# Patient Record
Sex: Female | Born: 1950 | Race: Black or African American | Hispanic: No | Smoking: Never smoker
Health system: Southern US, Community
[De-identification: ages and names within clinical notes are randomized; demographics above are authoritative.]

## PROBLEM LIST (undated history)

## (undated) DIAGNOSIS — K219 Gastro-esophageal reflux disease without esophagitis: Secondary | ICD-10-CM

## (undated) DIAGNOSIS — I10 Essential (primary) hypertension: Secondary | ICD-10-CM

## (undated) DIAGNOSIS — F419 Anxiety disorder, unspecified: Secondary | ICD-10-CM

## (undated) DIAGNOSIS — D649 Anemia, unspecified: Secondary | ICD-10-CM

## (undated) DIAGNOSIS — E119 Type 2 diabetes mellitus without complications: Secondary | ICD-10-CM

## (undated) DIAGNOSIS — K5730 Diverticulosis of large intestine without perforation or abscess without bleeding: Secondary | ICD-10-CM

## (undated) DIAGNOSIS — E7800 Pure hypercholesterolemia, unspecified: Secondary | ICD-10-CM

## (undated) DIAGNOSIS — R1032 Left lower quadrant pain: Secondary | ICD-10-CM

## (undated) HISTORY — DX: Gastro-esophageal reflux disease without esophagitis: K21.9

## (undated) HISTORY — DX: Left lower quadrant pain: R10.32

## (undated) HISTORY — DX: Anxiety disorder, unspecified: F41.9

## (undated) HISTORY — DX: Anemia, unspecified: D64.9

## (undated) HISTORY — DX: Diverticulosis of large intestine without perforation or abscess without bleeding: K57.30

## (undated) HISTORY — PX: PARTIAL HYSTERECTOMY: SHX80

---

## 1998-05-26 ENCOUNTER — Emergency Department (HOSPITAL_COMMUNITY): Admission: EM | Admit: 1998-05-26 | Discharge: 1998-05-26 | Payer: Self-pay | Admitting: Family Medicine

## 1998-05-28 ENCOUNTER — Encounter: Admission: RE | Admit: 1998-05-28 | Discharge: 1998-05-28 | Payer: Self-pay | Admitting: Family Medicine

## 1998-05-30 ENCOUNTER — Encounter: Admission: RE | Admit: 1998-05-30 | Discharge: 1998-08-28 | Payer: Self-pay | Admitting: *Deleted

## 1998-07-10 ENCOUNTER — Encounter: Admission: RE | Admit: 1998-07-10 | Discharge: 1998-07-10 | Payer: Self-pay | Admitting: Family Medicine

## 1998-10-10 ENCOUNTER — Encounter: Admission: RE | Admit: 1998-10-10 | Discharge: 1998-10-10 | Payer: Self-pay | Admitting: Family Medicine

## 1998-10-15 ENCOUNTER — Encounter: Admission: RE | Admit: 1998-10-15 | Discharge: 1998-10-15 | Payer: Self-pay | Admitting: Family Medicine

## 1998-11-09 ENCOUNTER — Encounter: Admission: RE | Admit: 1998-11-09 | Discharge: 1998-11-09 | Payer: Self-pay | Admitting: Family Medicine

## 1998-11-21 ENCOUNTER — Encounter: Admission: RE | Admit: 1998-11-21 | Discharge: 1998-11-21 | Payer: Self-pay | Admitting: Family Medicine

## 1998-11-26 ENCOUNTER — Encounter: Admission: RE | Admit: 1998-11-26 | Discharge: 1998-11-26 | Payer: Self-pay | Admitting: Sports Medicine

## 1998-11-29 ENCOUNTER — Encounter: Admission: RE | Admit: 1998-11-29 | Discharge: 1998-11-29 | Payer: Self-pay | Admitting: Sports Medicine

## 1998-12-05 ENCOUNTER — Encounter: Admission: RE | Admit: 1998-12-05 | Discharge: 1998-12-05 | Payer: Self-pay | Admitting: Family Medicine

## 1998-12-12 ENCOUNTER — Encounter: Admission: RE | Admit: 1998-12-12 | Discharge: 1998-12-12 | Payer: Self-pay | Admitting: Sports Medicine

## 1999-05-27 ENCOUNTER — Encounter: Admission: RE | Admit: 1999-05-27 | Discharge: 1999-05-27 | Payer: Self-pay | Admitting: Family Medicine

## 1999-05-30 ENCOUNTER — Encounter: Admission: RE | Admit: 1999-05-30 | Discharge: 1999-05-30 | Payer: Self-pay | Admitting: Family Medicine

## 1999-06-03 ENCOUNTER — Encounter: Admission: RE | Admit: 1999-06-03 | Discharge: 1999-06-03 | Payer: Self-pay | Admitting: Family Medicine

## 2000-04-20 ENCOUNTER — Emergency Department (HOSPITAL_COMMUNITY): Admission: EM | Admit: 2000-04-20 | Discharge: 2000-04-20 | Payer: Self-pay | Admitting: Emergency Medicine

## 2000-06-04 ENCOUNTER — Encounter: Admission: RE | Admit: 2000-06-04 | Discharge: 2000-06-04 | Payer: Self-pay | Admitting: Family Medicine

## 2001-01-27 ENCOUNTER — Encounter: Admission: RE | Admit: 2001-01-27 | Discharge: 2001-01-27 | Payer: Self-pay | Admitting: Sports Medicine

## 2001-01-27 ENCOUNTER — Encounter: Payer: Self-pay | Admitting: Sports Medicine

## 2001-10-21 ENCOUNTER — Encounter: Admission: RE | Admit: 2001-10-21 | Discharge: 2001-10-21 | Payer: Self-pay | Admitting: Family Medicine

## 2002-08-08 ENCOUNTER — Emergency Department (HOSPITAL_COMMUNITY): Admission: EM | Admit: 2002-08-08 | Discharge: 2002-08-08 | Payer: Self-pay | Admitting: Emergency Medicine

## 2002-08-10 ENCOUNTER — Encounter: Admission: RE | Admit: 2002-08-10 | Discharge: 2002-08-10 | Payer: Self-pay | Admitting: Neurology

## 2002-08-17 ENCOUNTER — Encounter: Admission: RE | Admit: 2002-08-17 | Discharge: 2002-08-17 | Payer: Self-pay | Admitting: Family Medicine

## 2002-08-24 ENCOUNTER — Encounter: Admission: RE | Admit: 2002-08-24 | Discharge: 2002-08-24 | Payer: Self-pay | Admitting: Family Medicine

## 2002-08-30 ENCOUNTER — Encounter: Payer: Self-pay | Admitting: Sports Medicine

## 2002-08-30 ENCOUNTER — Encounter: Admission: RE | Admit: 2002-08-30 | Discharge: 2002-08-30 | Payer: Self-pay | Admitting: Sports Medicine

## 2002-12-23 ENCOUNTER — Encounter: Admission: RE | Admit: 2002-12-23 | Discharge: 2002-12-23 | Payer: Self-pay | Admitting: Family Medicine

## 2003-03-29 ENCOUNTER — Ambulatory Visit (HOSPITAL_COMMUNITY): Admission: RE | Admit: 2003-03-29 | Discharge: 2003-03-29 | Payer: Self-pay | Admitting: Gastroenterology

## 2003-10-16 ENCOUNTER — Encounter: Admission: RE | Admit: 2003-10-16 | Discharge: 2003-10-16 | Payer: Self-pay | Admitting: Family Medicine

## 2003-10-23 ENCOUNTER — Encounter: Admission: RE | Admit: 2003-10-23 | Discharge: 2003-10-23 | Payer: Self-pay | Admitting: Family Medicine

## 2003-10-31 ENCOUNTER — Encounter: Admission: RE | Admit: 2003-10-31 | Discharge: 2003-10-31 | Payer: Self-pay | Admitting: Sports Medicine

## 2004-08-04 ENCOUNTER — Emergency Department (HOSPITAL_COMMUNITY): Admission: EM | Admit: 2004-08-04 | Discharge: 2004-08-04 | Payer: Self-pay | Admitting: Emergency Medicine

## 2005-10-01 ENCOUNTER — Ambulatory Visit: Payer: Self-pay | Admitting: Family Medicine

## 2005-10-23 ENCOUNTER — Ambulatory Visit: Payer: Self-pay | Admitting: Family Medicine

## 2005-12-16 ENCOUNTER — Ambulatory Visit: Payer: Self-pay | Admitting: Family Medicine

## 2005-12-17 ENCOUNTER — Encounter: Admission: RE | Admit: 2005-12-17 | Discharge: 2005-12-17 | Payer: Self-pay | Admitting: Sports Medicine

## 2006-03-03 ENCOUNTER — Ambulatory Visit: Payer: Self-pay | Admitting: Family Medicine

## 2006-03-16 ENCOUNTER — Ambulatory Visit: Payer: Self-pay | Admitting: Family Medicine

## 2006-03-16 ENCOUNTER — Ambulatory Visit (HOSPITAL_COMMUNITY): Admission: RE | Admit: 2006-03-16 | Discharge: 2006-03-16 | Payer: Self-pay | Admitting: Family Medicine

## 2006-03-23 ENCOUNTER — Ambulatory Visit: Payer: Self-pay | Admitting: Sports Medicine

## 2006-03-26 ENCOUNTER — Encounter (INDEPENDENT_AMBULATORY_CARE_PROVIDER_SITE_OTHER): Payer: Self-pay | Admitting: *Deleted

## 2006-03-26 ENCOUNTER — Ambulatory Visit (HOSPITAL_COMMUNITY): Admission: RE | Admit: 2006-03-26 | Discharge: 2006-03-26 | Payer: Self-pay | Admitting: Family Medicine

## 2006-04-30 ENCOUNTER — Ambulatory Visit: Payer: Self-pay | Admitting: Sports Medicine

## 2006-07-14 ENCOUNTER — Ambulatory Visit: Payer: Self-pay | Admitting: Family Medicine

## 2006-12-30 ENCOUNTER — Encounter: Admission: RE | Admit: 2006-12-30 | Discharge: 2006-12-30 | Payer: Self-pay | Admitting: Sports Medicine

## 2007-02-03 ENCOUNTER — Ambulatory Visit: Payer: Self-pay | Admitting: Family Medicine

## 2007-02-03 ENCOUNTER — Encounter (INDEPENDENT_AMBULATORY_CARE_PROVIDER_SITE_OTHER): Payer: Self-pay | Admitting: Family Medicine

## 2007-02-04 DIAGNOSIS — M899 Disorder of bone, unspecified: Secondary | ICD-10-CM | POA: Insufficient documentation

## 2007-02-04 DIAGNOSIS — I10 Essential (primary) hypertension: Secondary | ICD-10-CM | POA: Insufficient documentation

## 2007-02-04 DIAGNOSIS — M949 Disorder of cartilage, unspecified: Secondary | ICD-10-CM

## 2007-02-04 DIAGNOSIS — E785 Hyperlipidemia, unspecified: Secondary | ICD-10-CM | POA: Insufficient documentation

## 2007-03-19 ENCOUNTER — Encounter: Payer: Self-pay | Admitting: *Deleted

## 2007-03-22 ENCOUNTER — Ambulatory Visit: Payer: Self-pay | Admitting: Family Medicine

## 2007-03-22 ENCOUNTER — Encounter (INDEPENDENT_AMBULATORY_CARE_PROVIDER_SITE_OTHER): Payer: Self-pay | Admitting: Family Medicine

## 2007-03-22 LAB — CONVERTED CEMR LAB
ALT: 12 units/L (ref 0–35)
Albumin: 4.7 g/dL (ref 3.5–5.2)
BUN: 11 mg/dL (ref 6–23)
CO2: 27 meq/L (ref 19–32)
Cholesterol, target level: 200 mg/dL
Creatinine, Ser: 0.83 mg/dL (ref 0.40–1.20)
Glucose, Bld: 119 mg/dL — ABNORMAL HIGH (ref 70–99)
HDL: 43 mg/dL (ref >39)
Potassium: 4.5 meq/L (ref 3.5–5.3)
Sodium: 141 meq/L (ref 135–145)

## 2007-05-08 ENCOUNTER — Emergency Department (HOSPITAL_COMMUNITY): Admission: EM | Admit: 2007-05-08 | Discharge: 2007-05-08 | Payer: Self-pay | Admitting: Emergency Medicine

## 2007-05-11 ENCOUNTER — Telehealth: Payer: Self-pay | Admitting: *Deleted

## 2007-05-12 ENCOUNTER — Ambulatory Visit: Payer: Self-pay | Admitting: Family Medicine

## 2007-05-17 ENCOUNTER — Telehealth (INDEPENDENT_AMBULATORY_CARE_PROVIDER_SITE_OTHER): Payer: Self-pay | Admitting: *Deleted

## 2008-01-20 ENCOUNTER — Ambulatory Visit: Payer: Self-pay | Admitting: Family Medicine

## 2008-01-21 ENCOUNTER — Encounter: Payer: Self-pay | Admitting: Family Medicine

## 2008-01-21 ENCOUNTER — Ambulatory Visit: Payer: Self-pay | Admitting: Family Medicine

## 2008-01-21 LAB — CONVERTED CEMR LAB
BUN: 8 mg/dL (ref 6–23)
Calcium: 9.4 mg/dL (ref 8.4–10.5)
Chloride: 103 meq/L (ref 96–112)
Creatinine, Ser: 0.77 mg/dL (ref 0.40–1.20)
Sodium: 140 meq/L (ref 135–145)

## 2008-01-25 ENCOUNTER — Encounter: Admission: RE | Admit: 2008-01-25 | Discharge: 2008-01-25 | Payer: Self-pay | Admitting: *Deleted

## 2008-02-05 ENCOUNTER — Encounter (INDEPENDENT_AMBULATORY_CARE_PROVIDER_SITE_OTHER): Payer: Self-pay | Admitting: *Deleted

## 2008-03-28 ENCOUNTER — Telehealth: Payer: Self-pay | Admitting: *Deleted

## 2008-07-24 ENCOUNTER — Ambulatory Visit: Payer: Self-pay | Admitting: Family Medicine

## 2009-02-13 ENCOUNTER — Encounter: Admission: RE | Admit: 2009-02-13 | Discharge: 2009-02-13 | Payer: Self-pay | Admitting: *Deleted

## 2009-09-19 ENCOUNTER — Ambulatory Visit: Payer: Self-pay | Admitting: Family Medicine

## 2009-09-21 ENCOUNTER — Ambulatory Visit: Payer: Self-pay | Admitting: Family Medicine

## 2009-09-21 ENCOUNTER — Encounter: Payer: Self-pay | Admitting: Family Medicine

## 2009-09-21 LAB — CONVERTED CEMR LAB
ALT: 12 units/L (ref 0–35)
AST: 15 units/L (ref 0–37)
Alkaline Phosphatase: 106 units/L (ref 39–117)
BUN: 14 mg/dL (ref 6–23)
HDL: 44 mg/dL (ref >39)
Total Bilirubin: 0.5 mg/dL (ref 0.3–1.2)
Total Protein: 7.7 g/dL (ref 6.0–8.3)
VLDL: 31 mg/dL (ref 0–40)

## 2009-09-28 ENCOUNTER — Encounter: Payer: Self-pay | Admitting: Family Medicine

## 2009-11-06 ENCOUNTER — Ambulatory Visit: Payer: Self-pay | Admitting: Family Medicine

## 2009-11-06 DIAGNOSIS — E119 Type 2 diabetes mellitus without complications: Secondary | ICD-10-CM | POA: Insufficient documentation

## 2009-11-06 LAB — CONVERTED CEMR LAB: Hgb A1c MFr Bld: 7.4 %

## 2009-11-08 ENCOUNTER — Telehealth: Payer: Self-pay | Admitting: Family Medicine

## 2009-11-26 ENCOUNTER — Ambulatory Visit: Payer: Self-pay | Admitting: Family Medicine

## 2009-11-26 ENCOUNTER — Telehealth: Payer: Self-pay | Admitting: Family Medicine

## 2010-01-23 ENCOUNTER — Ambulatory Visit: Payer: Self-pay | Admitting: Family Medicine

## 2010-01-23 ENCOUNTER — Encounter: Payer: Self-pay | Admitting: Family Medicine

## 2010-01-23 DIAGNOSIS — R5383 Other fatigue: Secondary | ICD-10-CM

## 2010-01-23 DIAGNOSIS — R5381 Other malaise: Secondary | ICD-10-CM | POA: Insufficient documentation

## 2010-01-24 LAB — CONVERTED CEMR LAB
HCT: 36.3 % (ref 36.0–46.0)
Hemoglobin: 11.6 g/dL — ABNORMAL LOW (ref 12.0–15.0)
MCV: 99.2 fL (ref 78.0–100.0)
Platelets: 288 10*3/uL (ref 150–400)
RDW: 13.1 % (ref 11.5–15.5)
TSH: 1.418 microintl units/mL (ref 0.350–4.500)
WBC: 6.9 10*3/uL (ref 4.0–10.5)

## 2010-02-14 ENCOUNTER — Encounter: Admission: RE | Admit: 2010-02-14 | Discharge: 2010-02-14 | Payer: Self-pay | Admitting: Family Medicine

## 2010-05-02 ENCOUNTER — Telehealth: Payer: Self-pay | Admitting: Family Medicine

## 2010-05-24 ENCOUNTER — Ambulatory Visit: Payer: Self-pay | Admitting: Family Medicine

## 2010-05-24 ENCOUNTER — Encounter: Payer: Self-pay | Admitting: Family Medicine

## 2010-05-24 DIAGNOSIS — R634 Abnormal weight loss: Secondary | ICD-10-CM | POA: Insufficient documentation

## 2010-05-24 LAB — CONVERTED CEMR LAB
ALT: 10 units/L (ref 0–35)
AST: 13 units/L (ref 0–37)
Chloride: 104 meq/L (ref 96–112)
Creatinine, Ser: 0.89 mg/dL (ref 0.40–1.20)
Direct LDL: 63 mg/dL
Hgb A1c MFr Bld: 6.1 %
MCV: 98.6 fL (ref 78.0–100.0)
Platelets: 225 10*3/uL (ref 150–400)
RBC: 3.54 M/uL — ABNORMAL LOW (ref 3.87–5.11)
RDW: 13.1 % (ref 11.5–15.5)
Sodium: 141 meq/L (ref 135–145)
Total Bilirubin: 0.4 mg/dL (ref 0.3–1.2)
WBC: 5.2 10*3/uL (ref 4.0–10.5)

## 2010-05-27 ENCOUNTER — Encounter: Payer: Self-pay | Admitting: Family Medicine

## 2010-06-06 ENCOUNTER — Telehealth: Payer: Self-pay | Admitting: *Deleted

## 2010-12-19 ENCOUNTER — Telehealth: Payer: Self-pay | Admitting: *Deleted

## 2010-12-27 ENCOUNTER — Ambulatory Visit: Admission: RE | Admit: 2010-12-27 | Discharge: 2010-12-27 | Payer: Self-pay | Source: Home / Self Care

## 2010-12-27 ENCOUNTER — Encounter: Payer: Self-pay | Admitting: Family Medicine

## 2010-12-27 DIAGNOSIS — K5900 Constipation, unspecified: Secondary | ICD-10-CM | POA: Insufficient documentation

## 2010-12-27 LAB — CONVERTED CEMR LAB
ALT: 8 units/L (ref 0–35)
CO2: 28 meq/L (ref 19–32)
Calcium: 9.8 mg/dL (ref 8.4–10.5)
Creatinine, Ser: 0.78 mg/dL (ref 0.40–1.20)
Glucose, Bld: 108 mg/dL — ABNORMAL HIGH (ref 70–99)
HDL: 44 mg/dL (ref >39)
Sodium: 140 meq/L (ref 135–145)

## 2011-01-01 ENCOUNTER — Encounter: Payer: Self-pay | Admitting: Family Medicine

## 2011-01-09 NOTE — Progress Notes (Signed)
Summary: Rx  Phone Note Refill Request Call back at Home Phone [phone]   pt req rx for her testing strips, pt uses a prodigy meter. pt goes to [facility]   Initial call taken by: Knox Royalty,  December 19, 2010 8:38 AM    New/Updated Medications: PRODIGY PREFERRED MONITOR W/DEVICE KIT (BLOOD GLUCOSE MONITORING SUPPL) check blood sugars twice daily dx: DM type II 250.00 Prescriptions: PRODIGY PREFERRED MONITOR W/DEVICE KIT (BLOOD GLUCOSE MONITORING SUPPL) check blood sugars twice daily dx: DM type II 250.00  #1 x 0   Entered by:   Arlyss Repress CMA,   Authorized by:   Ellin Mayhew MD   Signed by:   Arlyss Repress CMA, on 12/19/2010   Method used:   Electronically to        [facility] [phone]* (retail)       [address]       [place], [place]  [address]       Ph: [phone]       Fax: [phone]   RxID:   [id number] PRODIGY PREFERRED MONITOR W/DEVICE KIT (BLOOD GLUCOSE MONITORING SUPPL) check blood sugars twice daily dx: DM type II 250.00  #0 x 0   Entered by:   Arlyss Repress CMA,   Authorized by:   Ellin Mayhew MD   Signed by:   Arlyss Repress CMA, on 12/19/2010   Method used:   Electronically to        [facility] [phone]* (retail)       [address]       [place], [place]  [address]       Ph: [phone]       Fax: [phone]   RxID:   [id number]

## 2011-01-09 NOTE — Assessment & Plan Note (Signed)
Summary: diabetes/htn/hyperlipidemia   Vital Signs:  Patient profile:   60 year old female Height:      63 inches Weight:      123 pounds BMI:     21.87 Temp:     98.0 degrees F oral Pulse rate:   73 / minute BP sitting:   128 / 79  (left arm) Cuff size:   regular  Vitals Entered By: Tessie Fass CMA (December 27, 2010 9:40 AM)   Primary Care Provider:  Ardeen Garland  MD   History of Present Illness:  Diabetes: Taking metformin in am.  tests 3 x week. in Am's usually 130's, the highest 170, the lowest 83. no symptoms of hypoglycemia.  pt states she is open to diabetes education classes.   h/a: usually doesn't take anything b/c they are not very strong.  has 1-2 x per week at night when trying to sleep.  wants to know if safe to take tylenol.  weight loss: pt states she is at goal weight, has cut back on suger, has a pepsi occasional,  has lost 1 lb in past 6 months.  States she eats lots of fruits and vegtables.   occasional constipation: pt wants recommendation of what she can take for her constipation.  Has a long hx of constipation off and on.  ros:  no fever, occasional constipation, no diarrhea, no n/v, no weakness, no blurry vision. occasional dizziness   Current Medications (verified): 1)  Aspirin Ec 81 Mg Tbec (Aspirin) .... Take 1 Tablet By Mouth Once A Day 2)  Pravachol 20 Mg Tabs (Pravastatin Sodium) .Marland Kitchen.. 1 Tab By Mouth Qhs Every Night For High Cholesterol 3)  Lisinopril-Hydrochlorothiazide 20-25 Mg  Tabs (Lisinopril-Hydrochlorothiazide) .... Take 1 Tab By Mouth Daily 4)  Metformin Hcl 500 Mg Tabs (Metformin Hcl) .Marland Kitchen.. 1 Tab By Mouth Qhs Every Night For Diabetes 5)  Prodigy Preferred Monitor W/device Kit (Blood Glucose Monitoring Suppl) .... Check Blood Sugars Twice Daily Dx: Dm Type Ii 250.00  Allergies (verified): No Known Drug Allergies  Review of Systems       as per hpi  Physical Exam  General:  Well-developed,well-nourished,in no acute distress;  alert,appropriate and cooperative throughout examination Lungs:  Normal respiratory effort, chest expands symmetrically. Lungs are clear to auscultation, no crackles or wheezes. Heart:  Normal rate and regular rhythm. S1 and S2 normal without gallop, murmur, click, rub or other extra sounds. Abdomen:  soft and non-tender.   Extremities:  no edema Skin:  Intact without suspicious lesions or rashes Psych:  Cognition and judgment appear intact. Alert and cooperative with normal attention span and concentration. No apparent delusions, illusions, hallucinations  Diabetes Management Exam:    Foot Exam (with socks and/or shoes not present):       Sensory-Pinprick/Light touch:          Left medial foot (L-4): normal          Left dorsal foot (L-5): normal          Left lateral foot (S-1): normal          Right medial foot (L-4): normal          Right dorsal foot (L-5): normal          Right lateral foot (S-1): normal       Sensory-Monofilament:          Left foot: normal          Right foot: normal       Inspection:  Left foot: normal          Right foot: normal       Nails:          Left foot: normal          Right foot: normal   Impression & Recommendations:  Problem # 1:  DIABETES MELLITUS, TYPE II, WITHOUT COMPLICATIONS (ICD-250.00) will refer to diabetes education classes.  A1C pending.  Will recheck renal function and urine microalbumin.  Pt instructed to obtain eye and dental appt.  Pt states that she will try to schedule.  No red flags on physical exam.  Foot exam WNL.  Her updated medication list for this problem includes:    Aspirin Ec 81 Mg Tbec (Aspirin) .Marland Kitchen... Take 1 tablet by mouth once a day    Lisinopril-hydrochlorothiazide 20-25 Mg Tabs (Lisinopril-hydrochlorothiazide) .Marland Kitchen... Take 1 tab by mouth daily    Metformin Hcl 500 Mg Tabs (Metformin hcl) .Marland Kitchen... 1 tab by mouth qhs every night for diabetes  Orders: A1C-[facility] (78469) UA Microalbumin-[facility] (62952) [facility]- Est   Level 4 (84132)  Problem # 2:  HYPERTENSION, BENIGN SYSTEMIC (ICD-401.1) bp wnl today.  Will continue to follow bp- will continue at this dose.   Her updated medication list for this problem includes:    Lisinopril-hydrochlorothiazide 20-25 Mg Tabs (Lisinopril-hydrochlorothiazide) .Marland Kitchen... Take 1 tab by mouth daily  Orders: Comp Met-[facility] ([address]-27253) [facility]- Est  Level 4 (66440)  Problem # 3:  HYPERLIPIDEMIA (ICD-272.4) will check lipid panel today.  Will also check liver panel. continue as prescribed unless any red flags on lab results.   Her updated medication list for this problem includes:    Pravachol 20 Mg Tabs (Pravastatin sodium) .Marland Kitchen... 1 tab by mouth qhs every night for high cholesterol  Orders: Comp Met-[facility] (34742-59563) Lipid-[facility] (87564-33295) [facility]- Est  Level 4 (18841)  Problem # 4:  WEIGHT LOSS (ICD-783.21)  pt at goal weight of 124.  States she has not been "trying" to lose weight.  BMI 21.  Encouraged pt to mantain weight at current weight.  Encouraged healthy eating and exercise.  Orders: [facility]- Est  Level 4 (99214)  Problem # 5:  CONSTIPATION (ICD-564.00)  suggested fibercon supplement to maintain stool regularity. pt to try and let me know if no improvement.  Orders: [facility]- Est  Level 4 (66063)  Problem # 6:  Prevention: pt is uptodate on mammogram and colonoscopy.  No longer needs pap's 2/2 hysterectomy.  [facility] form reviewed.  Pt is to return in 6 months for f/up and complete physical.   Complete Medication List: 1)  Aspirin Ec 81 Mg Tbec (Aspirin) .... Take 1 tablet by mouth once a day 2)  Pravachol 20 Mg Tabs (Pravastatin sodium) .Marland Kitchen.. 1 tab by mouth qhs every night for high cholesterol 3)  Lisinopril-hydrochlorothiazide 20-25 Mg Tabs (Lisinopril-hydrochlorothiazide) .... Take 1 tab by mouth daily 4)  Metformin Hcl 500 Mg Tabs (Metformin hcl) .Marland Kitchen.. 1 tab by mouth qhs every night for diabetes 5)  Prodigy Preferred Monitor W/device Kit (Blood glucose monitoring suppl)  .... Check blood sugars twice daily dx: dm type ii 250.00  Other Orders: Misc. Referral (Misc. Ref)  Patient Instructions: 1)  you need to get an eye appt and a dental appt. 2)  I will send you a letter with your results. 3)  keep up the good work. 4)  If you have any questions or concerns please give me a call otherwise, please come back in 6 months.  5)  very nice  meeting you today! Prescriptions: METFORMIN HCL 500 MG TABS (METFORMIN HCL) 1 tab by mouth qHS every night for diabetes  #30 Tablet x 6   Entered and Authorized by:   Ellin Mayhew MD   Signed by:   Ellin Mayhew MD on 12/27/2010   Method used:   Electronically to        [facility] [address] [phone]* (retail)       [address]       [place], [place]  [address]       Ph: [phone]       Fax: [phone]   RxID:   [id number] LISINOPRIL-HYDROCHLOROTHIAZIDE 20-25 MG  TABS (LISINOPRIL-HYDROCHLOROTHIAZIDE) Take 1 tab by mouth daily  #30 Tablet x 6   Entered and Authorized by:   Ellin Mayhew MD   Signed by:   Ellin Mayhew MD on 12/27/2010   Method used:   Electronically to        [facility] [phone]* (retail)       [address]       [place], [place]  [address]       Ph: [phone]       Fax: [phone]   RxID:   [id number] PRAVACHOL 20 MG TABS (PRAVASTATIN SODIUM) 1 tab by mouth qHS every night for high cholesterol  #30 Tablet x 6   Entered and Authorized by:   Ellin Mayhew MD   Signed by:   Ellin Mayhew MD on 12/27/2010   Method used:   Electronically to        [facility] [address] [phone]* (retail)       [address]       [place], [place]  [address]       Ph: [phone]       Fax: [phone]   RxID:   [id number]    Orders Added: 1)  Comp Met-[facility] [94854-62703] 2)  Lipid-[facility] [80061-22930] 3)  A1C-[facility] [83036] 4)  UA Microalbumin-[facility] [82044] 5)  Misc. Referral [Misc. Ref] 6)  [facility]- Est  Level 4 [50093]     Prevention & Chronic Care Immunizations   Influenza vaccine: Not  documented   Influenza vaccine deferral: Deferred  (12/27/2010)    Tetanus booster: 08/08/2002: Done.   Tetanus booster due: 08/08/2012    Pneumococcal vaccine: Not documented  Colorectal Screening   Hemoccult: Done.  (03/08/2006)   Hemoccult action/deferral: Not indicated  (05/24/2010)   Hemoccult due: 03/09/2007    Colonoscopy: Normal x hemorrhoids, diverticulosis  (03/30/2003)   Colonoscopy due: 03/29/2013  Other Screening   Pap smear: Not documented   Pap smear action/deferral: Not indicated S/P hysterectomy  (05/24/2010)    Mammogram: ASSESSMENT: Negative - BI-RADS 1^MM DIGITAL SCREENING  (02/14/2010)   Mammogram due: 02/15/2011   Smoking status: never  (05/24/2010)  Diabetes Mellitus   HgbA1C: 6.1  (05/24/2010)    Eye exam: Not documented   Diabetic eye exam action/deferral: Deferred  (12/27/2010)    Foot exam: yes  (12/27/2010)   High risk foot: Not documented   Foot care education: Not documented    Urine microalbumin/creatinine ratio: Not documented    Diabetes flowsheet reviewed?: Yes   Progress toward A1C goal: At goal  Lipids   Total Cholesterol: 192  (09/21/2009)   LDL: 117  (09/21/2009)   LDL Direct: 63  (05/24/2010)   HDL: 44  (09/21/2009)   Triglycerides: 154  (09/21/2009)    SGOT (AST): 13  (05/24/2010)   SGPT (ALT): 10  (05/24/2010)  CMP ordered    Alkaline phosphatase: 89  (05/24/2010)   Total bilirubin: 0.4  (05/24/2010)    Lipid flowsheet reviewed?: Yes   Progress toward LDL goal: Improved  Hypertension   Last Blood Pressure: 128 / 79  (12/27/2010)   Serum creatinine: 0.89  (05/24/2010)   Serum potassium 4.1  (05/24/2010) CMP ordered     Hypertension flowsheet reviewed?: Yes   Progress toward BP goal: At goal  Self-Management Support :   Personal Goals (by the next clinic visit) :     Personal A1C goal: 7  (11/06/2009)     Personal blood pressure goal: 130/80  (11/06/2009)     Personal LDL goal: 100  (09/19/2009)    Patient  will work on the following items until the next clinic visit to reach self-care goals:     Medications and monitoring: take my medicines every day, check my blood sugar, check my blood pressure, bring all of my medications to every visit  (11/26/2009)     Eating: drink diet soda or water instead of juice or soda, eat more vegetables  (11/26/2009)     Activity: take a 30 minute walk every day  (11/26/2009)    Diabetes self-management support: CBG self-monitoring log, Written self-care plan  (11/26/2009)    Hypertension self-management support: Not documented    Lipid self-management support: Not documented     Appended Document: A1c  5.7 %  & MALBU    Lab Visit  Laboratory Results   Urine Tests  Date/Time Received: December 27, 2010 10:47 AM  Date/Time Reported: December 27, 2010 1:45 PM   Microalbumin (urine): 10 mg/L Creatinine: 200mg /dL  A:C Ratio <16 Normal Comments: ...............test performed by......Marland KitchenBonnie A. [place], MLS (ASCP)cm   Blood Tests   Date/Time Received: December 27, 2010 10:47 AM  Date/Time Reported: December 27, 2010 1:45 PM   HGBA1C: 5.7%   (Normal Range: Non-Diabetic - 3-6%   Control Diabetic - 6-8%)  Comments: ...............test performed by......Marland KitchenBonnie A. [place], MLS (ASCP)cm    Orders Today:

## 2011-01-09 NOTE — Assessment & Plan Note (Signed)
Summary: feeling tired,tcb   Vital Signs:  Patient profile:   60 year old female Height:      63 inches Weight:      127.5 pounds BMI:     22.67 Temp:     97.9 degrees F oral Pulse rate:   94 / minute BP sitting:   106 / 69  (right arm) Cuff size:   regular  Vitals Entered By: Garen Grams LPN (January 23, 2010 12:02 PM) CC: fatigue x 1 week Is Patient Diabetic? No Pain Assessment Patient in pain? no        Primary Care Provider:  Ardeen Garland  MD  CC:  fatigue x 1 week.  History of Present Illness: 1) Fatigue: Reports intermittent fatigue lasts about 30 minutes, almost every day x 1 week. Reports 5 lb weight loss since last visit - has been eating more fruits and vegetables, less fried foods with DM2 diagnosis. Reports fasting sugars have been 80's to 160 (average closer to 120 - 130) but does not check every day. Reports poor sleep (this has been a longstanding issue) Denies presyncope, chest pain, dyspnea, neurological symptoms, headache, fever, chills, muscle aches, bony pain, breast mass, hair changes, skin changes,  temperature intolerance, tearfulness, anhedonia, melena, hematochezia, emesis, appetite change, dysuria, vaginal bleeding, polyuria, polydipsia, change in symptoms based on position.   Habits & Providers  Alcohol-Tobacco-Diet     Tobacco Status: never  Physical Exam  General:  alert, well-developed, well-nourished, and well-hydrated.  NAD, vitals reviewed. Mouth:  moist membranes  Neck:  no lymphadenopathy   Lungs:  CTAB w/o crackles or wheeze; normal work of breathing  Heart:  Normal rate and regular rhythm. S1 and S2 normal without gallop, murmur, click, rub or other extra sounds. Abdomen:  soft, no masses, no tenderness or distensio, +BS  Msk:  5/5 strength all extremities  Pulses:  2+ radials  Extremities:  no edema  Neurologic:  alert & oriented X3, cranial nerves II-XII intact, strength normal in all extremities, sensation intact to light touch,  and gait normal.     Impression & Recommendations:  Problem # 1:  FATIGUE (ICD-780.79) Assessment New Will check CBC, TSH today. Uncertain etiology but no red flags on review of systems. Follow up with PCP. Advised to keep track of symptoms in journal form.  Orders: CBC-[facility] (16109) TSH-[facility] ([address]) [facility]- Est Level  3 (91478)  Complete Medication List: 1)  Aspirin Ec 81 Mg Tbec (Aspirin) .... Take 1 tablet by mouth once a day 2)  Pravachol 20 Mg Tabs (Pravastatin sodium) .Marland Kitchen.. 1 tab by mouth qhs every night for high cholesterol 3)  Lisinopril-hydrochlorothiazide 20-25 Mg Tabs (Lisinopril-hydrochlorothiazide) .... Take 1 tab by mouth daily 4)  Metformin Hcl 500 Mg Tabs (Metformin hcl) .Marland Kitchen.. 1 tab by mouth qhs every night for diabetes

## 2011-01-09 NOTE — Assessment & Plan Note (Signed)
Summary: f/u and PATIENT SUMMARY   Vital Signs:  Patient profile:   60 year old female Height:      63 inches Weight:      124 pounds BMI:     22.05 Temp:     98.1 degrees F oral Pulse rate:   79 / minute BP sitting:   120 / 72  (left arm) Cuff size:   regular  Vitals Entered By: Tessie Fass CMA (May 24, 2010 8:41 AM) CC: F/U Meds Pain Assessment Patient in pain? no        Primary Care Provider:  Ardeen Garland  MD  CC:  F/U Meds.  History of Present Illness: Here for follow-up of diabetes, HTN, HLD.  States she has been losing weight.  NOte is documented in extra detail to serve as a patient summary for next primary provider.  1)DM - Diagnosed 10/10.  checks sugars in the morning - range 120 - 150.  Only checks sugars 2-3 times a week.  Taking metformin 1000mg  in the morning. Tolerating it now.  Initially felt nauseated but when she restarted it she had no problems.  First A1C 7.4% in 11/10.  Cr 0.87 10/10 2) HTN - takes lisinopril-HCTZ - tolerates it welll.  Last chem panel in 10/10.  3) HLD - on pravastatin 20.  Tolerating well.  Last FLP 10/10.  LDL was 117. 4) Weight loss - eating well.  10# loss since November, 3# loss since February.  No increase in activity.  No night sweats.  Had normal colonoscopy in April 2004 (report in [place]).  Does have constipation but no BRBPR or melena.  Never smoked.  Used to weight about 120# when younger.  Had gained weight for awhile, now back closer to that weight.  5) Headaches - started having mild headaches 5-6 months ago.  Pressure.  Usually on left side.  No nausea or photo or phonophobia.  Not bad per her report.  Usually will go away when she lays down.  Justs wants to know what she can take for it if she needs something.   Habits & Providers  Alcohol-Tobacco-Diet     Tobacco Status: never  Current Medications (verified): 1)  Aspirin Ec 81 Mg Tbec (Aspirin) .... Take 1 Tablet By Mouth Once A Day 2)  Pravachol 20 Mg Tabs  (Pravastatin Sodium) .Marland Kitchen.. 1 Tab By Mouth Qhs Every Night For High Cholesterol 3)  Lisinopril-Hydrochlorothiazide 20-25 Mg  Tabs (Lisinopril-Hydrochlorothiazide) .... Take 1 Tab By Mouth Daily 4)  Metformin Hcl 500 Mg Tabs (Metformin Hcl) .Marland Kitchen.. 1 Tab By Mouth Qhs Every Night For Diabetes  Social History: hx of ETOH abuse 20 years ago.  Has 2 daughters and 1 son.  Married, lives with husband.  Never smoked.  Physical Exam  General:  thin, alert, NAD, vitals reviewed Eyes:  pupils equal, pupils round, pupils reactive to light, corneas and lenses clear, no injection, no optic disk abnormalities, and no retinal abnormalitiies.   Lungs:  CTAB w/o crackles or wheeze; normal work of breathing  Heart:  Normal rate and regular rhythm. S1 and S2 normal without gallop, murmur, click, rub or other extra sounds. Pulses:  2+ radial and dp pulses Extremities:  no edema  Diabetes Management Exam:    Foot Exam (with socks and/or shoes not present):       Sensory-Pinprick/Light touch:          Left medial foot (L-4): normal          Left  dorsal foot (L-5): normal          Left lateral foot (S-1): normal          Right medial foot (L-4): normal          Right dorsal foot (L-5): normal          Right lateral foot (S-1): normal       Sensory-Monofilament:          Left foot: normal          Right foot: normal       Inspection:          Left foot: normal          Right foot: normal       Nails:          Left foot: normal          Right foot: normal    Eye Exam:       Eye Exam not due   Impression & Recommendations:  Problem # 1:  DIABETES MELLITUS, TYPE II, WITHOUT COMPLICATIONS (ICD-250.00) Assessment Improved A1C excellent today at 6.1%.  Continue with metformin 1000mg  daily.  She takes it in the morning.  Okay to continue 2-3x/week glucose monitoring since she has excellent control.  Return in 3 months.  Check Cr today.  Her updated medication list for this problem includes:    Aspirin Ec 81  Mg Tbec (Aspirin) .Marland Kitchen... Take 1 tablet by mouth once a day    Lisinopril-hydrochlorothiazide 20-25 Mg Tabs (Lisinopril-hydrochlorothiazide) .Marland Kitchen... Take 1 tab by mouth daily    Metformin Hcl 500 Mg Tabs (Metformin hcl) .Marland Kitchen... 1 tab by mouth qhs every night for diabetes  Orders: A1C-[facility] ([address]) [facility]- Est  Level 4 ([address])  Problem # 2:  HYPERTENSION, BENIGN SYSTEMIC (ICD-401.1) Assessment: Improved At goal.  Check K, Cr today.  Her updated medication list for this problem includes:    Lisinopril-hydrochlorothiazide 20-25 Mg Tabs (Lisinopril-hydrochlorothiazide) .Marland Kitchen... Take 1 tab by mouth daily  Orders: Comp Met-[facility] ([address]11-91478) CBC-[facility] (29562) [facility]- Est  Level 4 ([address])  Problem # 3:  HYPERLIPIDEMIA (ICD-272.4) Assessment: Unchanged Needs recheck on pravastatin.  Not fasting so will check direct LDL. Her updated medication list for this problem includes:    Pravachol 20 Mg Tabs (Pravastatin sodium) .Marland Kitchen... 1 tab by mouth qhs every night for high cholesterol  Orders: Direct LDL-[facility] (57846-96295) [facility]- Est  Level 4 (28413)  Problem # 4:  WEIGHT LOSS (KGM-010.27) Assessment: New  Unusual but not terribly concerning at this point without any other systemic signs of illness.  Nearing her past usual weight.  Patient instructed to monitor and come back if continues to drop weight though eating well.  Will check TSH today.    Orders: [facility]- Est  Level 4 ([address]214)  Complete Medication List: 1)  Aspirin Ec 81 Mg Tbec (Aspirin) .... Take 1 tablet by mouth once a day 2)  Pravachol 20 Mg Tabs (Pravastatin sodium) .Marland Kitchen.. 1 tab by mouth qhs every night for high cholesterol 3)  Lisinopril-hydrochlorothiazide 20-25 Mg Tabs (Lisinopril-hydrochlorothiazide) .... Take 1 tab by mouth daily 4)  Metformin Hcl 500 Mg Tabs (Metformin hcl) .Marland Kitchen.. 1 tab by mouth qhs every night for diabetes  Other Orders: TSH-[facility] ([id number])  Patient Instructions: 1)  Your A1C is 6.1%.  That is excellent.  Keep taking your  metformin as you are. 2)  Your blood pressure is great as well. 3)  We will check your kidneys, liver, cholesterol, and thyroid today.  I  will mail the results to you when I get them back. 4)  Please return in 3 months for your next check up and to meet Dr. Ellin Mayhew.  Prescriptions: METFORMIN HCL 500 MG TABS (METFORMIN HCL) 1 tab by mouth qHS every night for diabetes  #30 Tablet x 5   Entered and Authorized by:   Ardeen Garland  MD   Signed by:   Ardeen Garland  MD on 05/24/2010   Method used:   Print then Give to Patient   RxID:   [id number] LISINOPRIL-HYDROCHLOROTHIAZIDE 20-25 MG  TABS (LISINOPRIL-HYDROCHLOROTHIAZIDE) Take 1 tab by mouth daily  #30 Tablet x 5   Entered and Authorized by:   Ardeen Garland  MD   Signed by:   Ardeen Garland  MD on 05/24/2010   Method used:   Print then Give to Patient   RxID:   [id number] PRAVACHOL 20 MG TABS (PRAVASTATIN SODIUM) 1 tab by mouth qHS every night for high cholesterol  #30 Tablet x 5   Entered and Authorized by:   Ardeen Garland  MD   Signed by:   Ardeen Garland  MD on 05/24/2010   Method used:   Print then Give to Patient   RxID:   [id number] ASPIRIN EC 81 MG TBEC (ASPIRIN) Take 1 tablet by mouth once a day  #90 x 3   Entered and Authorized by:   Ardeen Garland  MD   Signed by:   Ardeen Garland  MD on 05/24/2010   Method used:   Print then Give to Patient   RxID:   [id number]   Laboratory Results   Blood Tests   Date/Time Received: May 24, 2010 8:45 AM  Date/Time Reported: May 24, 2010 9:01 AM   HGBA1C: 6.1%   (Normal Range: Non-Diabetic - 3-6%   Control Diabetic - 6-8%)  Comments: ...............test performed by......Marland KitchenBonnie A. Swaziland, MLS (ASCP)cm      Prevention & Chronic Care Immunizations   Influenza vaccine: Not documented    Tetanus booster: 08/08/2002: Done.   Tetanus booster due: 08/08/2012    Pneumococcal vaccine: Not documented  Colorectal Screening   Hemoccult: Done.  (03/08/2006)    Hemoccult action/deferral: Not indicated  (05/24/2010)   Hemoccult due: 03/09/2007    Colonoscopy: Normal x hemorrhoids, diverticulosis  (03/30/2003)   Colonoscopy due: 03/29/2013  Other Screening   Pap smear: Not documented   Pap smear action/deferral: Not indicated S/P hysterectomy  (05/24/2010)    Mammogram: ASSESSMENT: Negative - BI-RADS 1^MM DIGITAL SCREENING  (02/14/2010)   Mammogram due: 02/15/2011   Smoking status: never  (05/24/2010)  Diabetes Mellitus   HgbA1C: 6.1  (05/24/2010)    Eye exam: Not documented    Foot exam: yes  (05/24/2010)   High risk foot: Not documented   Foot care education: Not documented    Urine microalbumin/creatinine ratio: Not documented    Diabetes flowsheet reviewed?: Yes   Progress toward A1C goal: At goal  Lipids   Total Cholesterol: 192  (09/21/2009)   LDL: 117  (09/21/2009)   LDL Direct: Not documented   HDL: 44  (09/21/2009)   Triglycerides: 154  (09/21/2009)    SGOT (AST): 15  (09/21/2009)   SGPT (ALT): 12  (09/21/2009) CMP ordered    Alkaline phosphatase: 106  (09/21/2009)   Total bilirubin: 0.5  (09/21/2009)    Lipid flowsheet reviewed?: Yes   Progress toward LDL goal: Unchanged  Hypertension   Last Blood Pressure: 120 / 72  (05/24/2010)   Serum creatinine: 0.87  (  09/21/2009)   Serum potassium 4.2  (09/21/2009) CMP ordered    Progress toward BP goal: At goal  Self-Management Support :   Personal Goals (by the next clinic visit) :     Personal A1C goal: 7  (11/06/2009)     Personal blood pressure goal: 130/80  (11/06/2009)     Personal LDL goal: 100  (09/19/2009)    Diabetes self-management support: CBG self-monitoring log, Written self-care plan  (11/26/2009)    Hypertension self-management support: Not documented    Lipid self-management support: Not documented

## 2011-01-09 NOTE — Letter (Signed)
Summary: Generic Letter  [name]  [address]   [place], [place] [address]   Phone: [phone]  Fax: [phone]    01/01/2011  Pamela [name] [address] [place], [place]  [address]  Dear Ms. Bunt,  I hope that this letter finds you doing well.  Below are the results of your blood work.  I am very pleased with all of the results, they are all normal.   I do not advise any changes to your medications at this time.  If you have any questions please feel free to call me at the office.    Sincerely,   Ellin Mayhew MD    Sodium                    140 mEq/L                   135-145   Potassium                 4.2 mEq/L                   3.5-5.3   Chloride                  102 mEq/L                   96-112   CO2                       28 mEq/L                    19-32   Glucose              [H]  108 mg/dL                   13-24   BUN                       11 mg/dL                    4-01   Creatinine                0.78 mg/dL                  0.40-1.20   Bilirubin, Total          0.7 mg/dL                   0.2-7.2   Alkaline Phosphatase      92 U/L                      39-117   AST/SGOT                  12 U/L                      0-37   ALT/SGPT                  8 U/L                       0-35   Total Protein             7.7 g/dL  6.0-8.3   Albumin                   4.4 g/dL                    6.4-3.3   Calcium                   9.8 mg/dL                   2.9-51.8 ! Est GFR, African American                             >60 mL/min                  >60 ! Est GFR, NonAfrican American                             >60 mL/min                  >60  Tests: (2) Lipid Profile (84166)   Cholesterol               148 mg/dL                   0-630     ATP III Classification:           < 200        mg/dL        Desirable          200 - 239     mg/dL        Borderline High          >= 240        mg/dL        High         Triglyceride               92 mg/dL                    <160   HDL Cholesterol           44 mg/dL                    >10   Total Chol/HDL Ratio      3.4 Ratio  VLDL Cholesterol (Calc)                             18 mg/dL                    9-32  LDL Cholesterol (Calc)                             86 mg/dL                    3-55           Total Cholesterol/HDL Ratio:CHD Risk                            Coronary Heart Disease Risk Table  Men       Women              1/2 Average Risk              3.4        3.3                  Average Risk              5.0        4.4              2 X Average Risk              9.6        7.1              3 X Average Risk             23.4       11.0     Use the calculated Patient Ratio above and the CHD Risk table      to determine the patient's CHD Risk.     ATP III Classification (LDL):           < 100        mg/dL         Optimal          100 - 129     mg/dL         Near or Above Optimal          130 - 159     mg/dL         Borderline High          160 - 189     mg/dL         High           > 190        mg/dL         Very High         Appended Document: Generic Letter mailed  Appended Document: Generic Letter mailed

## 2011-01-09 NOTE — Letter (Signed)
Summary: Generic Letter  [name]  [address]   [place], [place] [address]   Phone: [phone]  Fax: [phone]    05/27/2010  KALISSA GRAYS [address] [place], [place]  [address]  Dear Ms. Bamford,  I am happy to inform you that your bloodwork is normal.  Your kidneys and liver are functioning well.  Your thyroid is normal.  Your cholesterol is excellent.  Continue taking all your medications as you have been.  If you have any questions, please call out office.          Sincerely,   Ardeen Garland  MD  Appended Document: Generic Letter mailed

## 2011-01-09 NOTE — Progress Notes (Signed)
Summary: triage  Phone Note Call from Patient Call back at Home Phone [phone]   Caller: Patient Summary of Call: Pt is trying to get life insurance and needs to know what her last AC1 was. Initial call taken by: Clydell Hakim,  June 06, 2010 2:26 PM  Follow-up for Phone Call        I gave her the info Follow-up by: Golden Circle RN,  June 06, 2010 2:34 PM

## 2011-01-09 NOTE — Progress Notes (Signed)
Summary: Rx Ques  Phone Note Call from Patient   Caller: Patient Summary of Call: Checking on status of medication for cholesterol. Initial call taken by: Clydell Hakim,  May 02, 2010 11:06 AM  Follow-up for Phone Call        patient advised that refill request has been received today and will  will be sent to MD. advised her to call for appointment for follow up with PCP. Follow-up by: Theresia Lo RN,  May 02, 2010 11:32 AM

## 2011-01-14 ENCOUNTER — Encounter: Payer: Self-pay | Admitting: *Deleted

## 2011-02-13 ENCOUNTER — Other Ambulatory Visit: Payer: Self-pay | Admitting: Family Medicine

## 2011-02-13 DIAGNOSIS — Z1231 Encounter for screening mammogram for malignant neoplasm of breast: Secondary | ICD-10-CM

## 2011-02-17 ENCOUNTER — Emergency Department (HOSPITAL_COMMUNITY)
Admission: EM | Admit: 2011-02-17 | Discharge: 2011-02-17 | Disposition: A | Discharge status: Home / Self Care | Payer: BC Managed Care – PPO | Attending: Emergency Medicine | Admitting: Emergency Medicine

## 2011-02-17 ENCOUNTER — Emergency Department (HOSPITAL_COMMUNITY): Payer: BC Managed Care – PPO

## 2011-02-17 DIAGNOSIS — R51 Headache: Secondary | ICD-10-CM | POA: Insufficient documentation

## 2011-02-17 DIAGNOSIS — E119 Type 2 diabetes mellitus without complications: Secondary | ICD-10-CM | POA: Insufficient documentation

## 2011-02-17 DIAGNOSIS — K029 Dental caries, unspecified: Secondary | ICD-10-CM | POA: Insufficient documentation

## 2011-02-17 DIAGNOSIS — I10 Essential (primary) hypertension: Secondary | ICD-10-CM | POA: Insufficient documentation

## 2011-02-17 LAB — SEDIMENTATION RATE: Sed Rate: 21 mm/hr (ref 0–22)

## 2011-02-17 LAB — GLUCOSE, CAPILLARY: Glucose-Capillary: 105 mg/dL — ABNORMAL HIGH (ref 70–99)

## 2011-03-06 ENCOUNTER — Ambulatory Visit
Admission: RE | Admit: 2011-03-06 | Discharge: 2011-03-06 | Disposition: A | Discharge status: Home / Self Care | Payer: BC Managed Care – PPO | Source: Ambulatory Visit | Attending: Family Medicine | Admitting: Family Medicine

## 2011-03-06 DIAGNOSIS — Z1231 Encounter for screening mammogram for malignant neoplasm of breast: Secondary | ICD-10-CM

## 2011-04-25 NOTE — Op Note (Signed)
NAME:  BENICIA, BERGEVIN                           ACCOUNT NO.:  [id number]   MEDICAL RECORD NO.:  [id number]                   PATIENT TYPE:  AMB   LOCATION:  ENDO                                 FACILITY:  [facility]   PHYSICIAN:  Petra Kuba, M.D.                 DATE OF BIRTH:  1951/08/04   DATE OF PROCEDURE:  03/29/2003  DATE OF DISCHARGE:                                 OPERATIVE REPORT   PROCEDURE:  Colonoscopy.   SURGEON:  Petra Kuba, M.D.   INDICATIONS:  Patient with some constipation, bright red blood per rectum,  due for colon screening.  Consent was signed after risks, benefits, method  and options thoroughly discussed in the office.   MEDICATIONS:  Demerol 100 mg, Versed 7.5 mg   DESCRIPTION OF PROCEDURE:  Rectal inspection is pertinent for small external  hemorrhoids.  Digital examination was negative.  Video pediatric adjustable  colonoscope was inserted and easily advanced around the colon the cecum.  This did require some abdominal pressure, no position changes.  No obvious  abnormality was seen on insertion.  The cecum was identified by the  appendiceal orifice and the ileocecal valve.  The scope was inserted short  way into the terminal ileum which was normal.  Photo documentation was  obtained.  The scope was slowly withdrawn.  The prep was adequate.  There  was some liquid stool that required washing and suctioning.  The scope was  withdrawn through the colon and other than an occasional left-sided  diverticulum, no polyps, masses, signs of bleeding were seen as we slowly  withdrew back to the rectum.  Once back in the rectum the scope was  retroflexed, pertinent for some internal hemorrhoids.  The scope was  straightened and readvanced a short way up the left side of the colon.  Air  was suctioned, the scope removed.  The patient tolerated the procedure well.  There was no obvious immediate complication.   ENDOSCOPIC ASSESSMENT:  1. Internal/external small  hemorrhoids.  2. Left occasional diverticulum.  3. Otherwise within normal limits to the terminal ileum.    PLAN:  Happy to see back prn.  Return care to Dr. Leveda Anna for the customary  healthcare management to include yearly rectal examinations and guaiacs.  Have him proceed with repeat screening in 5-10 years.  Otherwise for  constipation would use customary stool softeners, fiber and if that didn't  work _______ and MiraLax and possibly a trial of Zelnorm down the road.  Happy to assist if needed in the future.                                               Petra Kuba, M.D.    MEM/MEDQ  D:  03/29/2003  T:  03/29/2003  Job:  [id number]   cc:   William A. Hensel, M.D.  [address] [place]  [place] [address]  Fax: [phone]

## 2011-07-22 ENCOUNTER — Encounter: Payer: Self-pay | Admitting: Family Medicine

## 2011-07-22 ENCOUNTER — Ambulatory Visit (INDEPENDENT_AMBULATORY_CARE_PROVIDER_SITE_OTHER): Payer: BC Managed Care – PPO | Admitting: Family Medicine

## 2011-07-22 DIAGNOSIS — M949 Disorder of cartilage, unspecified: Secondary | ICD-10-CM

## 2011-07-22 DIAGNOSIS — Z7189 Other specified counseling: Secondary | ICD-10-CM

## 2011-07-22 DIAGNOSIS — M899 Disorder of bone, unspecified: Secondary | ICD-10-CM

## 2011-07-22 DIAGNOSIS — E119 Type 2 diabetes mellitus without complications: Secondary | ICD-10-CM

## 2011-07-22 DIAGNOSIS — I10 Essential (primary) hypertension: Secondary | ICD-10-CM

## 2011-07-22 NOTE — Patient Instructions (Addendum)
Regular Blood glucose is 70-140. If your blood sugar goes low: drink 4 oz of orange juice and eat a snack.  Recheck to make sure it is normal in approx .  If you start having elevations you should come back to see me.  We will check your A1C today Get a dental appointment.  Continue to check your blood glucose and take your medicines.  Please bring your medications and blood glucose log to your next appointment.

## 2011-07-22 NOTE — Progress Notes (Signed)
  Subjective:    Patient ID: Pamela [name], female    DOB: [birth year], 60 y.o.   MRN: [id number]  HPI Diabetes: Checking bg levels.  2 x week.  Usually in 120's.  Highest 171.  Lowest 81. No lows.  Eye appointment complete- 01/2011.  Exercise: works in yard, constantly moving, walks in stores,  "my husband says I need to just sit down"   Foot checks: daily  Pap- had hysterectomy.  Told by previous doctor that she does not need.  One sexual partner.  No vaginal discharge.  No concern for STD's.   Colonscopy- Had colonoscopy approx 5 years ago.  Was normal.    Osteopenia- Unsure about history of this diagnosis.    Review of Systems    as per above. no fever. No weight loss.  No pain.   Objective:   Physical Exam  Constitutional: She is oriented to person, place, and time. She appears well-developed and well-nourished.  HENT:  Head: Normocephalic and atraumatic.  Cardiovascular: Normal rate and regular rhythm.   No murmur heard. Pulmonary/Chest: Effort normal and breath sounds normal. No respiratory distress. She has no wheezes.  Abdominal: Soft. She exhibits no distension.  Musculoskeletal: Normal range of motion. She exhibits no edema.  Neurological: She is alert and oriented to person, place, and time.  Skin: No rash noted.  Psychiatric: She has a normal mood and affect. Her behavior is normal. Thought content normal.          Assessment & Plan:

## 2011-07-29 DIAGNOSIS — Z7189 Other specified counseling: Secondary | ICD-10-CM | POA: Insufficient documentation

## 2011-07-29 NOTE — Assessment & Plan Note (Signed)
Pap- had hysterectomy.  Told by previous doctor that she does not need.  One sexual partner.  No vaginal discharge.  No concern for STD's.   Colonscopy- Had colonoscopy approx 5 years ago.  Was normal.

## 2011-07-29 NOTE — Assessment & Plan Note (Signed)
dexa scan from 2007 showed osteopenia.  Will discuss possible repeat dexa with pt at her next appointment.

## 2011-07-29 NOTE — Assessment & Plan Note (Signed)
Hypertension well controlled on current regimen.  Of lisinopril 20 and HCTZ 25mg .

## 2011-08-22 ENCOUNTER — Other Ambulatory Visit: Payer: Self-pay | Admitting: Family Medicine

## 2011-08-22 NOTE — Telephone Encounter (Signed)
Refill request

## 2011-09-12 ENCOUNTER — Other Ambulatory Visit: Payer: Self-pay | Admitting: Family Medicine

## 2011-09-12 NOTE — Telephone Encounter (Signed)
Refill request

## 2011-09-15 ENCOUNTER — Ambulatory Visit: Payer: BC Managed Care – PPO | Admitting: Family Medicine

## 2011-09-22 ENCOUNTER — Telehealth: Payer: Self-pay | Admitting: *Deleted

## 2011-09-22 ENCOUNTER — Ambulatory Visit (INDEPENDENT_AMBULATORY_CARE_PROVIDER_SITE_OTHER): Payer: BC Managed Care – PPO

## 2011-09-22 DIAGNOSIS — Z23 Encounter for immunization: Secondary | ICD-10-CM

## 2011-09-22 NOTE — Telephone Encounter (Signed)
Patient is interested in getting Zostavax vaccine. Called her pharmacy to make sure they provide. Pharmacist tried to run in a fake claim and would not go thru but he states she can bring in RX and her card and he will try it . Will send message to Dr. Edmonia James to send in RX to [facility] , [address]

## 2011-09-24 ENCOUNTER — Other Ambulatory Visit: Payer: Self-pay | Admitting: Family Medicine

## 2011-09-24 MED ORDER — ZOSTER VACCINE LIVE 19400 UNT/0.65ML ~~LOC~~ SOLR: 0.6500 mL | Freq: Once | SUBCUTANEOUS | Status: DC

## 2011-09-24 NOTE — Telephone Encounter (Signed)
Patient notified

## 2011-09-24 NOTE — Telephone Encounter (Signed)
Rx sent to pharmacy for zostavax.

## 2011-10-12 ENCOUNTER — Other Ambulatory Visit: Payer: Self-pay | Admitting: Family Medicine

## 2011-10-12 NOTE — Telephone Encounter (Signed)
Refill request

## 2012-01-27 ENCOUNTER — Ambulatory Visit (INDEPENDENT_AMBULATORY_CARE_PROVIDER_SITE_OTHER): Payer: BC Managed Care – PPO | Admitting: Family Medicine

## 2012-01-27 ENCOUNTER — Telehealth: Payer: Self-pay | Admitting: Family Medicine

## 2012-01-27 ENCOUNTER — Encounter: Payer: Self-pay | Admitting: Family Medicine

## 2012-01-27 ENCOUNTER — Other Ambulatory Visit (HOSPITAL_COMMUNITY)
Admission: RE | Admit: 2012-01-27 | Discharge: 2012-01-27 | Disposition: A | Discharge status: Home / Self Care | Payer: BC Managed Care – PPO | Source: Ambulatory Visit | Attending: Family Medicine | Admitting: Family Medicine

## 2012-01-27 VITALS — BP 118/62 | HR 78 | Temp 97.9°F | Ht 63.0 in | Wt 126.3 lb

## 2012-01-27 DIAGNOSIS — E119 Type 2 diabetes mellitus without complications: Secondary | ICD-10-CM

## 2012-01-27 DIAGNOSIS — Z7189 Other specified counseling: Secondary | ICD-10-CM

## 2012-01-27 DIAGNOSIS — Z01419 Encounter for gynecological examination (general) (routine) without abnormal findings: Secondary | ICD-10-CM | POA: Insufficient documentation

## 2012-01-27 DIAGNOSIS — Z124 Encounter for screening for malignant neoplasm of cervix: Secondary | ICD-10-CM

## 2012-01-27 DIAGNOSIS — N952 Postmenopausal atrophic vaginitis: Secondary | ICD-10-CM

## 2012-01-27 DIAGNOSIS — N760 Acute vaginitis: Secondary | ICD-10-CM

## 2012-01-27 DIAGNOSIS — I10 Essential (primary) hypertension: Secondary | ICD-10-CM

## 2012-01-27 LAB — POCT WET PREP (WET MOUNT)

## 2012-01-27 LAB — POCT GLYCOSYLATED HEMOGLOBIN (HGB A1C): Hemoglobin A1C: 5.9

## 2012-01-27 MED ORDER — ESTROGENS, CONJUGATED 0.625 MG/GM VA CREA: TOPICAL_CREAM | VAGINAL | Status: DC

## 2012-01-27 NOTE — Assessment & Plan Note (Signed)
Good control.  Continue current management 

## 2012-01-27 NOTE — Assessment & Plan Note (Signed)
Initial BP elevated, but repeat normal. Taking meds without problems.

## 2012-01-27 NOTE — Progress Notes (Signed)
  Subjective:    Patient ID: Pamela [name], female    DOB: 22-Jun-1951, 61 y.o.   MRN: [id number]  HPI 61 yo female here for vaginal soreness.  She is diabetic with good control.  Thought she had a yeast infection weeks ago, treated with a cream with resolution of itching.  Now feels "sore" in private area for a few days.  Last sexual activity 1 week ago.  Monogamous with husband, not frequent intercourse.  +vaginal dryness.  +mild itching.  No discharge.  No dysuria. Has been told that she does not need paps.  Had "part of" uterus taken out many years ago - unclear reason.  Glucose at home runs 120s fasting.  Reports she takes all her meds.     Review of Systemssee HPI     Objective:   Physical Exam  Constitutional: She appears well-developed and well-nourished. No distress.  Genitourinary:       Nl external female genitalia.  Nl vagina wit dereased rugae, +atrophic changes.  No lesions.  +scant discharge.  +cervix visualized.  Stenotic os.  Pap done.  Wet prep done. Bimanual:  No CMT.  No adnexal mass or TTP.   Skin: She is not diaphoretic.          Assessment & Plan:

## 2012-01-27 NOTE — Telephone Encounter (Signed)
Pamela [name] was seen today and prescribed a cream for yeast infection.  After insurance it still cost her $174.  Pt cannot afford.  Need something else called to pharmacy.  Please call patient back when new ordered completed.

## 2012-01-27 NOTE — Assessment & Plan Note (Signed)
Wet prep pending to r/o yeast vaginitis, but sx likely from atrophic changes.  Will treat with estrogen cream.  Follow up as needed.

## 2012-01-27 NOTE — Patient Instructions (Signed)
I think the soreness is because of dryness.  You can try the cream.  You can start with 0.5 to 2 gm in the vagina daily, and then after a week or 2, you can decrease it to every other day or a few times a week.  If your symptoms get worse, you can increase it again. You can also use lubrication when you do have sex.   It does look like you have a cervix (the opening of the uterus).  So, you should probably get pap smears regularly.  If they have all been normal in the past, you can get them every 3 years until you are 89, and then you can stop.   We will call you if there are problems with the test we did today. Let us know if you have any questions.

## 2012-01-28 NOTE — Telephone Encounter (Signed)
The cream was actually a vaginal estrogen to help with vaginal dryness.  Unfortunately, if her insurance is charging her this much, there really isn't a cheaper cream that I could find.  She could try over the counter Replens and see if that might help.

## 2012-01-28 NOTE — Telephone Encounter (Signed)
Patient has called back about the medication and the results.

## 2012-01-28 NOTE — Telephone Encounter (Signed)
Please call patient regarding below.  Patient says she has never waitied so long to get a response back.  Still having vaginal soreness and need to be advised about what she can use in lieu of the rx orginally prescribed since it cost too much.  Also, want to know results of pap

## 2012-01-28 NOTE — Telephone Encounter (Signed)
Patient states she is not having discharge or itching but just the soreness, advised that Dr. Swaziland will be in clinic tomorrow and will advise then . She is agreeable . Advised that Pap report  has not come in yet.

## 2012-01-29 ENCOUNTER — Encounter: Payer: Self-pay | Admitting: Family Medicine

## 2012-01-29 NOTE — Telephone Encounter (Signed)
Patient notified

## 2012-02-02 ENCOUNTER — Encounter: Payer: Self-pay | Admitting: Family Medicine

## 2012-02-19 ENCOUNTER — Telehealth: Payer: Self-pay | Admitting: *Deleted

## 2012-02-19 MED ORDER — BLOOD GLUCOSE MONITORING SUPPL SUPPLIES MISC: Status: DC

## 2012-02-19 NOTE — Telephone Encounter (Signed)
Addended by: Orvil Feil L on: 02/19/2012 03:30 PM   Modules accepted: Orders

## 2012-02-19 NOTE — Telephone Encounter (Signed)
Patient comes to office stating she needs strips for  her glucose meter. 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 Called pharmacy and gave RX verbally with instructions to check BS twice daily.

## 2012-02-19 NOTE — Telephone Encounter (Signed)
Pharmacy calls stating insurance will not cover  Contour strips. They have given her a free True Result meter and need rx for strips . RX given verbally

## 2012-02-27 ENCOUNTER — Other Ambulatory Visit: Payer: Self-pay | Admitting: Family Medicine

## 2012-02-27 DIAGNOSIS — Z1231 Encounter for screening mammogram for malignant neoplasm of breast: Secondary | ICD-10-CM

## 2012-03-31 ENCOUNTER — Other Ambulatory Visit: Payer: Self-pay | Admitting: Family Medicine

## 2012-04-01 ENCOUNTER — Ambulatory Visit
Admission: RE | Admit: 2012-04-01 | Discharge: 2012-04-01 | Disposition: A | Discharge status: Home / Self Care | Payer: BC Managed Care – PPO | Source: Ambulatory Visit | Attending: Family Medicine | Admitting: Family Medicine

## 2012-04-01 DIAGNOSIS — Z1231 Encounter for screening mammogram for malignant neoplasm of breast: Secondary | ICD-10-CM

## 2012-05-01 ENCOUNTER — Other Ambulatory Visit: Payer: Self-pay | Admitting: Family Medicine

## 2012-06-06 ENCOUNTER — Other Ambulatory Visit: Payer: Self-pay | Admitting: Family Medicine

## 2012-07-12 ENCOUNTER — Other Ambulatory Visit: Payer: Self-pay | Admitting: Family Medicine

## 2012-09-02 ENCOUNTER — Other Ambulatory Visit: Payer: Self-pay | Admitting: Family Medicine

## 2012-09-02 NOTE — Telephone Encounter (Signed)
Pt needs appt for further refills. 

## 2012-09-24 ENCOUNTER — Encounter: Payer: Self-pay | Admitting: Family Medicine

## 2012-09-24 ENCOUNTER — Ambulatory Visit (INDEPENDENT_AMBULATORY_CARE_PROVIDER_SITE_OTHER): Payer: BC Managed Care – PPO | Admitting: Family Medicine

## 2012-09-24 VITALS — BP 131/89 | HR 65 | Temp 97.8°F | Ht 63.0 in | Wt 127.0 lb

## 2012-09-24 DIAGNOSIS — E119 Type 2 diabetes mellitus without complications: Secondary | ICD-10-CM

## 2012-09-24 DIAGNOSIS — Z23 Encounter for immunization: Secondary | ICD-10-CM

## 2012-09-24 DIAGNOSIS — I10 Essential (primary) hypertension: Secondary | ICD-10-CM

## 2012-09-24 DIAGNOSIS — E785 Hyperlipidemia, unspecified: Secondary | ICD-10-CM

## 2012-09-24 LAB — COMPREHENSIVE METABOLIC PANEL
ALT: 11 U/L (ref 0–35)
CO2: 29 mEq/L (ref 19–32)
Calcium: 9.8 mg/dL (ref 8.4–10.5)
Chloride: 104 mEq/L (ref 96–112)
Creat: 0.94 mg/dL (ref 0.50–1.10)
Glucose, Bld: 128 mg/dL — ABNORMAL HIGH (ref 70–99)
Total Bilirubin: 0.7 mg/dL (ref 0.3–1.2)

## 2012-09-24 LAB — CBC
HCT: 34.5 % — ABNORMAL LOW (ref 36.0–46.0)
MCV: 94.3 fL (ref 78.0–100.0)
Platelets: 283 10*3/uL (ref 150–400)
RBC: 3.66 MIL/uL — ABNORMAL LOW (ref 3.87–5.11)
RDW: 13.3 % (ref 11.5–15.5)
WBC: 4.6 10*3/uL (ref 4.0–10.5)

## 2012-09-24 LAB — LIPID PANEL
Cholesterol: 152 mg/dL (ref 0–200)
HDL: 45 mg/dL (ref >39)
Triglycerides: 120 mg/dL (ref <150)

## 2012-09-24 LAB — POCT GLYCOSYLATED HEMOGLOBIN (HGB A1C): Hemoglobin A1C: 6

## 2012-09-24 NOTE — Progress Notes (Signed)
S: Pt comes in today for follow up.  DIABETES Home CBGs: usually low 100's when fasting Meds: met 500 daily Taking Meds: yes # of doses missed per week: 1x/mo Hypoglycemic episodes?: no Symptoms: Polyuria: no Polydipsia: no Parasthesias: no   Dizziness: no  Nausea:  no Vomiting:  no Last A1c: today 6.0   HYPERTENSION BP: 131/89 Meds: lisino 20, HCTZ 25 Taking meds: Yes    # of doses missed/week: rarely Symptoms: Headache: Occasionally a "light" HA at night Dizziness: No Vision changes: No SOB:  No Chest pain: No LE swelling: No Tobacco use: No   HYPERLIPIDEMIA  Meds: zocor 40 Muscle aches: no Last FLP or LDL:  Lab Results  Component Value Date   LDLCALC 86 12/27/2010   Lab Results  Component Value Date   CHOL 148 12/27/2010   HDL 44 12/27/2010   LDLCALC 86 12/27/2010   LDLDIRECT 63 05/24/2010   TRIG 92 12/27/2010   CHOLHDL 3.4 Ratio 12/27/2010   Weight:  Wt Readings from Last 3 Encounters:  09/24/12 127 lb (57.607 kg)  01/27/12 126 lb 4.8 oz (57.289 kg)  07/22/11 123 lb (55.792 kg)       ROS: Per HPI  History  Smoking status  . Never Smoker   Smokeless tobacco  . Former Neurosurgeon  . Types: Snuff    O:  Filed Vitals:   09/24/12 0929  BP: 131/89  Pulse: 65  Temp: 97.8 F (36.6 C)    Gen: NAD CV: RRR, no murmur Pulm: CTA bilat, no wheezes or crackles Ext: Warm, no edema   A/P: 61 y.o. female p/w HTN, DM, HLD -See problem list -f/u in 3-6 months

## 2012-09-24 NOTE — Assessment & Plan Note (Signed)
Usually well controlled, slightly elevated today.  Check CMET. F/u 3 months for recheck. No changes, cont lisino 20, HCTZ 25

## 2012-09-24 NOTE — Assessment & Plan Note (Addendum)
A1c 6.0. Cont metformin 500 daily.  Foot exam normal today. Flu given. Pt to decide about Zostavax. Pt will schedule eye appt.

## 2012-09-24 NOTE — Assessment & Plan Note (Signed)
Check FLP today. Cont zocor 20

## 2012-09-24 NOTE — Patient Instructions (Addendum)
It was nice to meet you today.  Your blood pressure was just barely high, since it's been so well controlled, no changes today.  Your sugars are well controlled, keep taking your metformin.  We are checking some labs today, I'll send you a letter with the results.  We gave you your flu shot and tetanus shot.  Please think about if you want the Shingles vaccine (Zostavax).   Come back in 3 months.

## 2012-09-25 ENCOUNTER — Encounter: Payer: Self-pay | Admitting: Family Medicine

## 2012-10-08 ENCOUNTER — Other Ambulatory Visit: Payer: Self-pay | Admitting: *Deleted

## 2012-10-08 MED ORDER — METFORMIN HCL 500 MG PO TABS: 500.0000 mg | ORAL_TABLET | Freq: Every day | ORAL | Status: DC

## 2013-03-07 ENCOUNTER — Ambulatory Visit (INDEPENDENT_AMBULATORY_CARE_PROVIDER_SITE_OTHER): Payer: BC Managed Care – PPO | Admitting: Family Medicine

## 2013-03-07 ENCOUNTER — Encounter: Payer: Self-pay | Admitting: Family Medicine

## 2013-03-07 VITALS — BP 127/71 | HR 79 | Ht 63.0 in | Wt 128.5 lb

## 2013-03-07 DIAGNOSIS — G4700 Insomnia, unspecified: Secondary | ICD-10-CM

## 2013-03-07 DIAGNOSIS — E785 Hyperlipidemia, unspecified: Secondary | ICD-10-CM

## 2013-03-07 DIAGNOSIS — I10 Essential (primary) hypertension: Secondary | ICD-10-CM

## 2013-03-07 DIAGNOSIS — Z23 Encounter for immunization: Secondary | ICD-10-CM

## 2013-03-07 DIAGNOSIS — E119 Type 2 diabetes mellitus without complications: Secondary | ICD-10-CM

## 2013-03-07 MED ORDER — PRAVASTATIN SODIUM 20 MG PO TABS: 20.0000 mg | ORAL_TABLET | Freq: Every day | ORAL | Status: DC

## 2013-03-07 MED ORDER — METFORMIN HCL 500 MG PO TABS: 500.0000 mg | ORAL_TABLET | Freq: Every day | ORAL | Status: DC

## 2013-03-07 MED ORDER — LISINOPRIL-HYDROCHLOROTHIAZIDE 20-25 MG PO TABS: ORAL_TABLET | ORAL | Status: DC

## 2013-03-07 NOTE — Progress Notes (Signed)
S: Pt comes in today for follow up.  DIABETES Home CBGs: usually 100-120 Meds: metformin 500 qd Taking Meds: yes # of doses missed per week: 1x/ month Hypoglycemic episodes?: no Symptoms: Polyuria: no Polydipsia: no Parasthesias: no   Dizziness: no  Nausea:  no Vomiting:  no Last A1c: 6.2 today; (6.0 09/2012)   HYPERTENSION BP: 127/71 Meds: lisino 20, HCTZ 25 Taking meds: Yes     # of doses missed/week: 1x/ month Symptoms: Headache: No Dizziness: No Vision changes: No SOB:  No Chest pain: No LE swelling: No Tobacco use: No   HYPERLIPIDEMIA  Meds: zocor 40 Muscle aches: no Last FLP or LDL:  Lab Results  Component Value Date   LDLCALC 83 09/24/2012   Lab Results  Component Value Date   CHOL 152 09/24/2012   HDL 45 09/24/2012   LDLCALC 83 09/24/2012   LDLDIRECT 63 05/24/2010   TRIG 120 09/24/2012   CHOLHDL 3.4 09/24/2012   Weight:  Wt Readings from Last 3 Encounters:  03/07/13 128 lb 8 oz (58.287 kg)  09/24/12 127 lb (57.607 kg)  01/27/12 126 lb 4.8 oz (57.289 kg)    INSOMNIA Does a lot of yard work and house work.  Feels like she gets good exercise.  Has a hard time falling asleep.  Will sometimes wake up 1 time in the middle of the night.  Usually lays down around 1030/11p- watches TV.  Sometimes will turn it off.  Usually gets up around 8am.     ROS: Per HPI  History  Smoking status  . Never Smoker   Smokeless tobacco  . Former Neurosurgeon  . Types: Snuff    O:  Filed Vitals:   03/07/13 1347  BP: 127/71  Pulse: 79    Gen: NAD CV: RRR, no murmur Pulm: CTA bilat, no wheezes or crackles Ext: Warm, no chronic skin changes, no edema   A/P: 62 y.o. female p/w HTN, HLD, DM -See problem list -f/u in 6 months

## 2013-03-07 NOTE — Assessment & Plan Note (Addendum)
Well controlled today, continue lisino/HCTZ 20/25. CMET 09/2012 WNL, consider rechecking BMET 09/2013 to follow lytes and Cr.  F/u 3-6 months.

## 2013-03-07 NOTE — Assessment & Plan Note (Signed)
Tolerating pravastatin 20mg - theoretically should push dose based on newest guidelines, but did not do this today.  Next LDL/FLP due 09/2013.

## 2013-03-07 NOTE — Patient Instructions (Addendum)
It was good to see you today.  Your diabetes and blood pressure look perfect! I sent in refills to the pharmacy.   Call your insurance company and ask about the Shingles vaccine.  You should also think about the pneumonia vaccine.  We gave you your Tetanus vaccine today.  For your sleep, you can try melatonin- it's over the counter.  Other things to do include good sleep hygiene, such as: ?Sleep as long as necessary to feel rested (usually 7 to 8 hours for adults) and then get out of bed ?Maintain a regular sleep schedule ?Try not to force sleep ?Avoid caffeinated beverages after lunch ?Avoid alcohol near bedtime (eg, late afternoon and evening) ?Avoid smoking or other nicotine intake, particularly during the evening ?Adjust the bedroom environment as needed to decrease stimuli (eg, reduce ambient light, turn off the television or radio) ?Resolve concerns or worries before bedtime ?Exercise regularly for at least 20 minutes, preferably more than four to five hours prior to bedtime ?Avoid daytime naps, especially if they are longer than 20 to 30 minutes or occur late in the day   Come back to see me if you are still not sleeping well and we can talk about other things we can do.  Otherwise, come back in 3-6 months for your next check!

## 2013-03-07 NOTE — Assessment & Plan Note (Addendum)
A1c 6.2 today. Continue met 500 qd. Pt will call insurance company about zostavax and pneumovax. Tdap given today.  Eye appt done last summer.  F/u 3-6 months.

## 2013-03-07 NOTE — Assessment & Plan Note (Signed)
Given information about sleep hygiene.  Would prefer to avoid Rx meds at this time.  Also suggested melatonin if she wanted to try something in addition to sleep hygiene.

## 2013-05-16 ENCOUNTER — Other Ambulatory Visit: Payer: Self-pay

## 2013-05-16 DIAGNOSIS — Z1231 Encounter for screening mammogram for malignant neoplasm of breast: Secondary | ICD-10-CM

## 2013-05-23 ENCOUNTER — Ambulatory Visit: Payer: BC Managed Care – PPO

## 2013-06-09 ENCOUNTER — Ambulatory Visit
Admission: RE | Admit: 2013-06-09 | Discharge: 2013-06-09 | Disposition: A | Discharge status: Home / Self Care | Payer: BC Managed Care – PPO | Source: Ambulatory Visit

## 2013-06-09 DIAGNOSIS — Z1231 Encounter for screening mammogram for malignant neoplasm of breast: Secondary | ICD-10-CM

## 2013-06-16 ENCOUNTER — Other Ambulatory Visit: Payer: Self-pay

## 2013-07-21 ENCOUNTER — Encounter: Payer: Self-pay | Admitting: Family Medicine

## 2013-07-21 ENCOUNTER — Ambulatory Visit (INDEPENDENT_AMBULATORY_CARE_PROVIDER_SITE_OTHER): Payer: BC Managed Care – PPO | Admitting: Family Medicine

## 2013-07-21 VITALS — BP 128/77 | HR 71 | Wt 128.1 lb

## 2013-07-21 DIAGNOSIS — E119 Type 2 diabetes mellitus without complications: Secondary | ICD-10-CM

## 2013-07-21 DIAGNOSIS — G4700 Insomnia, unspecified: Secondary | ICD-10-CM

## 2013-07-21 DIAGNOSIS — I10 Essential (primary) hypertension: Secondary | ICD-10-CM

## 2013-07-21 NOTE — Patient Instructions (Addendum)
It was great to meet you!  Sleep: Sleep hygiene, benadryl, melatonin (5-6 mg 30 min to 1 hour before bed)  Lets keep doing what youre doing, great job!   Diet Recommendations for Diabetes   Starchy (carb) foods include: Bread, rice, pasta, potatoes, corn, crackers, bagels, muffins, all baked goods.   Protein foods include: Meat, fish, poultry, eggs, dairy foods, and beans such as pinto and kidney beans (beans also provide carbohydrate).   1. Eat at least 3 meals and 1-2 snacks per day. Never go more than 4-5 hours while awake without eating.  2. Limit starchy foods to TWO per meal and ONE per snack. ONE portion of a starchy  food is equal to the following:   - ONE slice of bread (or its equivalent, such as half of a hamburger bun).   - 1/2 cup of a "scoopable" starchy food such as potatoes or rice.   - 1 OUNCE (28 grams) of starchy snack foods such as crackers or pretzels (look on label).   - 15 grams of carbohydrate as shown on food label.  3. Both lunch and dinner should include a protein food, a carb food, and vegetables.   - Obtain twice as many veg's as protein or carbohydrate foods for both lunch and dinner.   - Try to keep frozen veg's on hand for a quick vegetable serving.     - Fresh or frozen veg's are best.  4. Breakfast should always include protein.

## 2013-07-21 NOTE — Assessment & Plan Note (Signed)
Discussed sleep hygiene Recommended melatonin or Benadryl, discussed the Benadryl it is sedating effects if used nightly so emphasized that should be only occasional Followup three-months

## 2013-07-21 NOTE — Assessment & Plan Note (Addendum)
Well-controlled today, no red flags Continue lisinopril HCTZ 20/25 Followup 3 months, BMP at that time.

## 2013-07-21 NOTE — Assessment & Plan Note (Signed)
Controlled today A1c 6.5 Fasting blood sugars average in the 120s Continue metformin 500 mg daily, will plan to increase if her A1c slides up No complications, will follow with ophthalmology this month. Followup 3 months.

## 2013-07-21 NOTE — Progress Notes (Signed)
  Subjective:    Patient ID: Pamela [name], female    DOB: 24-Dec-1950, 61 y.o.   MRN: [id number]  HPI  patient here for followup of diabetes, hypertension and insomnia  Diabetes Average fasting blood sugar is in the 120s, highest postprandial she has ever seen was 187 and states that it's usually not that high. Planning to go see an eye doctor in the next month for her yearly exam. Denies polyuria, polydipsia, vision problems, and numbness and tingling in the feet.  Takes her medications regularly, and watches her carb intake.  She has multiple family members with diabetes and has discussed the diabetic diet with them so she feels very comfortable with it.  Hypertension Takes meds regularly, does not check at home, does not watch her salt intake Denies headache, chest pain, dyspnea, and palpitations.  Insomnia Has problems falling asleep Often waking up in melena which has been occurring for multiple months Has had exercise recommended which she states she is doing. Has not tried any medications States that her husband watches TV in bed and that's very difficult for her not to watch TV in bed.  Review of Systems Per hpi    Objective:   Physical Exam  Gen: NAD, alert, cooperative with exam HEENT: NCAT, EOMI CV: RRR, good S1/S2, no murmur Resp: CTABL, no wheezes, non-labored Ext: No edema, warm Neuro: Alert and oriented, No gross deficits DM foot exam: no lesions, 2+ DP pulses, monofilament sensation intact across 5 toes BL       Assessment & Plan:

## 2013-08-31 ENCOUNTER — Other Ambulatory Visit: Payer: Self-pay | Admitting: Family Medicine

## 2013-09-18 ENCOUNTER — Encounter (HOSPITAL_COMMUNITY): Payer: Self-pay | Admitting: Emergency Medicine

## 2013-09-18 ENCOUNTER — Inpatient Hospital Stay (HOSPITAL_COMMUNITY)
Admission: EM | Admit: 2013-09-18 | Discharge: 2013-09-21 | DRG: 182 | Disposition: A | Discharge status: Home / Self Care | Payer: BC Managed Care – PPO | Attending: Internal Medicine | Admitting: Internal Medicine

## 2013-09-18 ENCOUNTER — Emergency Department (HOSPITAL_COMMUNITY): Payer: BC Managed Care – PPO

## 2013-09-18 DIAGNOSIS — K2970 Gastritis, unspecified, without bleeding: Secondary | ICD-10-CM | POA: Diagnosis present

## 2013-09-18 DIAGNOSIS — R1032 Left lower quadrant pain: Secondary | ICD-10-CM

## 2013-09-18 DIAGNOSIS — F4321 Adjustment disorder with depressed mood: Secondary | ICD-10-CM | POA: Diagnosis present

## 2013-09-18 DIAGNOSIS — Z79899 Other long term (current) drug therapy: Secondary | ICD-10-CM

## 2013-09-18 DIAGNOSIS — G4700 Insomnia, unspecified: Secondary | ICD-10-CM | POA: Diagnosis present

## 2013-09-18 DIAGNOSIS — E785 Hyperlipidemia, unspecified: Secondary | ICD-10-CM | POA: Diagnosis present

## 2013-09-18 DIAGNOSIS — K559 Vascular disorder of intestine, unspecified: Secondary | ICD-10-CM | POA: Diagnosis present

## 2013-09-18 DIAGNOSIS — R55 Syncope and collapse: Secondary | ICD-10-CM | POA: Diagnosis present

## 2013-09-18 DIAGNOSIS — I951 Orthostatic hypotension: Secondary | ICD-10-CM | POA: Diagnosis present

## 2013-09-18 DIAGNOSIS — I10 Essential (primary) hypertension: Secondary | ICD-10-CM | POA: Diagnosis present

## 2013-09-18 DIAGNOSIS — D638 Anemia in other chronic diseases classified elsewhere: Secondary | ICD-10-CM | POA: Diagnosis present

## 2013-09-18 DIAGNOSIS — E7800 Pure hypercholesterolemia, unspecified: Secondary | ICD-10-CM | POA: Diagnosis present

## 2013-09-18 DIAGNOSIS — K5792 Diverticulitis of intestine, part unspecified, without perforation or abscess without bleeding: Secondary | ICD-10-CM

## 2013-09-18 DIAGNOSIS — K649 Unspecified hemorrhoids: Secondary | ICD-10-CM

## 2013-09-18 DIAGNOSIS — E119 Type 2 diabetes mellitus without complications: Secondary | ICD-10-CM | POA: Diagnosis present

## 2013-09-18 DIAGNOSIS — K921 Melena: Secondary | ICD-10-CM | POA: Diagnosis present

## 2013-09-18 DIAGNOSIS — R112 Nausea with vomiting, unspecified: Secondary | ICD-10-CM

## 2013-09-18 DIAGNOSIS — K279 Peptic ulcer, site unspecified, unspecified as acute or chronic, without hemorrhage or perforation: Secondary | ICD-10-CM | POA: Diagnosis present

## 2013-09-18 DIAGNOSIS — E860 Dehydration: Secondary | ICD-10-CM | POA: Diagnosis present

## 2013-09-18 DIAGNOSIS — N183 Chronic kidney disease, stage 3 unspecified: Secondary | ICD-10-CM | POA: Diagnosis present

## 2013-09-18 DIAGNOSIS — N289 Disorder of kidney and ureter, unspecified: Secondary | ICD-10-CM

## 2013-09-18 DIAGNOSIS — M899 Disorder of bone, unspecified: Secondary | ICD-10-CM

## 2013-09-18 DIAGNOSIS — K5732 Diverticulitis of large intestine without perforation or abscess without bleeding: Principal | ICD-10-CM | POA: Diagnosis present

## 2013-09-18 HISTORY — DX: Pure hypercholesterolemia, unspecified: E78.00

## 2013-09-18 HISTORY — DX: Essential (primary) hypertension: I10

## 2013-09-18 HISTORY — DX: Type 2 diabetes mellitus without complications: E11.9

## 2013-09-18 LAB — CBC WITH DIFFERENTIAL/PLATELET
Basophils Relative: 0 % (ref 0–1)
Eosinophils Relative: 0 % (ref 0–5)
Lymphocytes Relative: 23 % (ref 12–46)
Lymphs Abs: 2.2 10*3/uL (ref 0.7–4.0)
Monocytes Relative: 4 % (ref 3–12)
Neutrophils Relative %: 72 % (ref 43–77)
Platelets: 247 10*3/uL (ref 150–400)
RBC: 3.58 MIL/uL — ABNORMAL LOW (ref 3.87–5.11)
WBC: 9.3 10*3/uL (ref 4.0–10.5)

## 2013-09-18 LAB — POCT I-STAT TROPONIN I
Troponin i, poc: 0.02 ng/mL (ref 0.00–0.08)
Troponin i, poc: 0.02 ng/mL (ref 0.00–0.08)

## 2013-09-18 LAB — BASIC METABOLIC PANEL
CO2: 27 mEq/L (ref 19–32)
GFR calc non Af Amer: 46 mL/min — ABNORMAL LOW (ref >90)
Glucose, Bld: 193 mg/dL — ABNORMAL HIGH (ref 70–99)
Potassium: 4.1 mEq/L (ref 3.5–5.1)
Sodium: 135 mEq/L (ref 135–145)

## 2013-09-18 LAB — GLUCOSE, CAPILLARY

## 2013-09-18 MED ORDER — ASPIRIN EC 81 MG PO TBEC
81.0000 mg | DELAYED_RELEASE_TABLET | Freq: Every day | ORAL | Status: DC
  Filled 2013-09-18: qty 1

## 2013-09-18 MED ORDER — ACETAMINOPHEN 325 MG PO TABS
650.0000 mg | ORAL_TABLET | Freq: Once | ORAL | Status: AC
Start: 2013-09-18 — End: 2013-09-18
  Administered 2013-09-18: 650 mg via ORAL
  Filled 2013-09-18: qty 2

## 2013-09-18 MED ORDER — GI COCKTAIL ~~LOC~~
30.0000 mL | Freq: Once | ORAL | Status: AC
  Administered 2013-09-18: 30 mL via ORAL
  Filled 2013-09-18: qty 30

## 2013-09-18 MED ORDER — SODIUM CHLORIDE 0.9 % IJ SOLN
3.0000 mL | Freq: Two times a day (BID) | INTRAMUSCULAR | Status: DC
  Administered 2013-09-19: 3 mL via INTRAVENOUS

## 2013-09-18 MED ORDER — SODIUM CHLORIDE 0.9 % IV BOLUS (SEPSIS)
1000.0000 mL | Freq: Once | INTRAVENOUS | Status: AC
  Administered 2013-09-18: 1000 mL via INTRAVENOUS

## 2013-09-18 MED ORDER — ZOLPIDEM TARTRATE 5 MG PO TABS
5.0000 mg | ORAL_TABLET | Freq: Every evening | ORAL | Status: DC | PRN
  Administered 2013-09-19 – 2013-09-21 (×3): 5 mg via ORAL
  Filled 2013-09-18 (×3): qty 1

## 2013-09-18 MED ORDER — POTASSIUM CHLORIDE IN NACL 20-0.9 MEQ/L-% IV SOLN
INTRAVENOUS | Status: AC
  Administered 2013-09-19: 01:00:00 via INTRAVENOUS
  Filled 2013-09-18: qty 1000

## 2013-09-18 NOTE — ED Notes (Signed)
Bed: WA09 Expected date:  Expected time:  Means of arrival:  Comments: Upstairs pt passed out, vomitting

## 2013-09-18 NOTE — ED Notes (Signed)
Patient was visiting her husband on 4th floor who is terminal when she almost passed out.  Sister took her blood sugar upstairs and it was 175.  Patient vomited upstairs but denies nausea or pain now.  Alert but very weak acting.

## 2013-09-18 NOTE — ED Provider Notes (Signed)
CSN: [id number]     Arrival date & time 09/18/13  1612 History   First MD Initiated Contact with Patient 09/18/13 1642     Chief Complaint  Patient presents with  . Near Syncope   (Consider location/radiation/quality/duration/timing/severity/associated sxs/prior Treatment) HPI Comments: Pt is a 62 y.o. female with Pmhx as above who presents with syncope about 45 mins ago. Pt has been in hospital w/ husband who is dying from lung cancer, has not been eating or sleeping well (did not sleep at all last night). She was called back to hospital early this am by doctor saying husband would likely die today. She had just got up from toilet in room when felt lightheaded, had dec vision, tachycardia, followed by syncope episode, n/v, d/a. She had some chest burning after the emesis.  Currently states she feels somewhat better.    Patient is a 62 y.o. female presenting with syncope. The history is provided by the patient and a relative. No language interpreter was used.  Loss of Consciousness Episode history:  Single Most recent episode:  Today Timing:  Constant Progression:  Resolved Chronicity:  New Context: standing up and urination   Witnessed: yes   Relieved by:  Lying down Worsened by:  Nothing tried Ineffective treatments:  None tried Associated symptoms: dizziness, nausea, visual change and vomiting   Associated symptoms: no anxiety, no chest pain, no confusion, no diaphoresis, no difficulty breathing, no fever, no focal weakness, no headaches, no palpitations, no shortness of breath and no weakness   Visual Change:    Location:  Both eyes   Quality: decreased vision   Vomiting:    Quality:  Stomach contents   Number of occurrences:  2   Progression:  Resolved Risk factors: no congenital heart disease, no coronary artery disease, no seizures and no vascular disease     Past Medical History  Diagnosis Date  . Hypertension   . Diabetes mellitus without complication   .  Hypercholesteremia    No past surgical history on file. No family history on file. History  Substance Use Topics  . Smoking status: Never Smoker   . Smokeless tobacco: Former Neurosurgeon    Types: Snuff  . Alcohol Use: No   OB History   Grav Para Term Preterm Abortions TAB SAB Ect Mult Living                 Review of Systems  Constitutional: Negative for fever, chills, diaphoresis, activity change, appetite change and fatigue.  HENT: Negative for congestion, facial swelling, rhinorrhea and sore throat.   Eyes: Negative for photophobia and discharge.  Respiratory: Negative for cough, chest tightness and shortness of breath.   Cardiovascular: Positive for syncope. Negative for chest pain, palpitations and leg swelling.  Gastrointestinal: Positive for nausea and vomiting. Negative for abdominal pain and diarrhea.  Endocrine: Negative for polydipsia and polyuria.  Genitourinary: Negative for dysuria, frequency, difficulty urinating and pelvic pain.  Musculoskeletal: Negative for arthralgias, back pain, neck pain and neck stiffness.  Skin: Negative for color change and wound.  Allergic/Immunologic: Negative for immunocompromised state.  Neurological: Positive for dizziness. Negative for focal weakness, facial asymmetry, weakness, numbness and headaches.  Hematological: Does not bruise/bleed easily.  Psychiatric/Behavioral: Negative for confusion and agitation.    Allergies  Review of patient's allergies indicates no known allergies.  Home Medications   Current Outpatient Rx  Name  Route  Sig  Dispense  Refill  . aspirin EC 81 MG EC tablet   Oral  Take 81 mg by mouth daily.           Marland Kitchen conjugated estrogens (PREMARIN) vaginal cream      Use 0.5 - 2 gm every night in the vagina.  If your symptoms improve, you may decrease the use.   42.5 g   12   . ibuprofen (ADVIL,MOTRIN) 200 MG tablet   Oral   Take 200 mg by mouth every 6 (six) hours as needed for pain.         Marland Kitchen  lisinopril-hydrochlorothiazide (PRINZIDE,ZESTORETIC) 20-25 MG per tablet      take 1 tablet by mouth once daily   90 tablet   3   . metFORMIN (GLUCOPHAGE) 500 MG tablet   Oral   Take 1 tablet (500 mg total) by mouth daily with breakfast.   90 tablet   3   . pravastatin (PRAVACHOL) 20 MG tablet   Oral   Take 1 tablet (20 mg total) by mouth daily.   90 tablet   3    BP 140/56  Pulse 83  Resp 15  SpO2 100% Physical Exam  Constitutional: She is oriented to person, place, and time. She appears well-developed and well-nourished. No distress.  HENT:  Head: Normocephalic and atraumatic.  Mouth/Throat: No oropharyngeal exudate.  Eyes: Pupils are equal, round, and reactive to light.  Neck: Normal range of motion. Neck supple.  Cardiovascular: Normal rate, regular rhythm and normal heart sounds.  Exam reveals no gallop and no friction rub.   No murmur heard. Pulmonary/Chest: Effort normal and breath sounds normal. No respiratory distress. She has no wheezes. She has no rales.  Abdominal: Soft. Bowel sounds are normal. She exhibits no distension and no mass. There is no tenderness. There is no rebound and no guarding.  Musculoskeletal: Normal range of motion. She exhibits no edema and no tenderness.  Neurological: She is alert and oriented to person, place, and time.  Skin: Skin is warm and dry.  Psychiatric: She has a normal mood and affect.    ED Course  Procedures (including critical care time) Labs Review Labs Reviewed  CBC WITH DIFFERENTIAL - Abnormal; Notable for the following:    RBC 3.58 (*)    Hemoglobin 11.3 (*)    HCT 33.7 (*)    All other components within normal limits  BASIC METABOLIC PANEL - Abnormal; Notable for the following:    Glucose, Bld 193 (*)    Creatinine, Ser 1.23 (*)    GFR calc non Af Amer 46 (*)    GFR calc Af Amer 53 (*)    All other components within normal limits  GLUCOSE, CAPILLARY - Abnormal; Notable for the following:    Glucose-Capillary  188 (*)    All other components within normal limits  POCT I-STAT TROPONIN I  POCT I-STAT TROPONIN I   Imaging Review Dg Chest 2 View  09/18/2013   CLINICAL DATA:  Syncope.  EXAM: CHEST  2 VIEW  COMPARISON:  None.  FINDINGS: Heart size and mediastinal contours are within normal limits. Both lungs are clear. Visualized skeletal structures are unremarkable.  IMPRESSION: Negative chest.   Electronically Signed   By: Drusilla Kanner M.D.   On: 09/18/2013 17:37    EKG Interpretation   None       Date: 09/18/2013  Rate: 66  Rhythm: normal sinus rhythm  QRS Axis: normal  Intervals: normal  ST/T Wave abnormalities: nonspecific T wave changes, TWI V2-v4  Conduction Disutrbances:none  Narrative Interpretation:  Old EKG Reviewed: unchanged    MDM   1. Syncope   2. Dehydration   3. Feeling grief   4. Insomnia   5. Orthostatic hypotension   6. Renal insufficiency, mild    Pt is a 62 y.o. female with Pmhx as above who presents with syncope about 45 mins ago. Pt has been in hospital w/ husband who is dying from lung cancer, has not been eating or sleeping well (did not sleep at all last night). She was called back to hospital early this am by doctor saying husband would likely die today. She had just got up from toilet in room when felt lightheaded, had dec vision, sensation of tachypalpitations, followed by syncope episode, n/v, d/a. She had some chest burning after the emesis and in dept.  Currently states she feels somewhat better.  Cardiopulm & neuro exam benign. EKG unchanged from prior.  Pt given 2L NS.  Orthostatics positive, pt is dehydrated.  First trop negative.  [facility] consulted, will admit here though pt family practice pt (does not want to be transferred to hospital away from dying husband).         Shanna Cisco, MD 09/18/13 2329

## 2013-09-18 NOTE — ED Notes (Signed)
Per Sharon Seller, RN, they are going to move pt to another room. There is another RN coming in at 2300 to open up additional beds on the unit.

## 2013-09-18 NOTE — ED Notes (Signed)
Pt brought down from floor, charge nurse told pts husband is actively dying. Pt brought down in stretcher, pt alert and oriented, speaking in full sentences. Within minutes of arrival in ED pt reported she need to have a bowel movement and proceeded to take her pants off. Pt given bed pan and had bowel movement.

## 2013-09-18 NOTE — ED Notes (Signed)
Bedside report received from previous R, Cynthia.

## 2013-09-18 NOTE — H&P (Signed)
PCP:   Pamela Fenton, MD   Chief Complaint:  Pamela [name] out  HPI: 62 yo female was going to the bathroom when she got dizzy and passed out, family member caught her before she fell.  Came too and had a episode of n/v.  Pt has been with her husband for several days who is dying from lung cancer on the fourth floor, she has not eaten or slept in days.  Syncopal episode was brief.  Found to have significant orthostatis on arrival to ED.  Has received 2 liter of ivf.  Also had some epigastric abd pain after syncope, she is hungry and her stomach hurts but she cannot eat.  No fevers.  No diarrhea.  Review of Systems:  Positive and negative as per HPI otherwise all other systems are negative  Past Medical History: Past Medical History  Diagnosis Date  . Hypertension   . Diabetes mellitus without complication   . Hypercholesteremia     Medications: Prior to Admission medications   Medication Sig Start Date End Date Taking? Authorizing Provider  aspirin EC 81 MG EC tablet Take 81 mg by mouth daily.     Yes Historical Provider, MD  conjugated estrogens (PREMARIN) vaginal cream Use 0.5 - 2 gm every night in the vagina.  If your symptoms improve, you may decrease the use. 01/27/12  Yes Pamela T [place], MD  ibuprofen (ADVIL,MOTRIN) 200 MG tablet Take 200 mg by mouth every 6 (six) hours as needed for pain.   Yes Historical Provider, MD  lisinopril-hydrochlorothiazide (PRINZIDE,ZESTORETIC) 20-25 MG per tablet take 1 tablet by mouth once daily 03/07/13  Yes Pamela A McGill, MD  metFORMIN (GLUCOPHAGE) 500 MG tablet Take 1 tablet (500 mg total) by mouth daily with breakfast. 03/07/13  Yes Pamela A McGill, MD  pravastatin (PRAVACHOL) 20 MG tablet Take 1 tablet (20 mg total) by mouth daily. 03/07/13  Yes Pamela A McGill, MD    Allergies:  No Known Allergies  Social History:  reports that she has never smoked. She has quit using smokeless tobacco. Her smokeless tobacco use included Snuff. She  reports that she does not drink alcohol. Her drug history is not on file.  Family History: none  Physical Exam: Filed Vitals:   09/18/13 1821 09/18/13 1831 09/18/13 2000 09/18/13 2133  BP: 120/54 112/75 142/65 127/54  Pulse: 76 78 90 86  Resp: 18 13 17 18   SpO2: 99% 100% 99% 100%   General appearance: alert, cooperative and no distress Head: Normocephalic, without obvious abnormality, atraumatic Eyes: negative Nose: Nares normal. Septum midline. Mucosa normal. No drainage or sinus tenderness. Neck: no JVD and supple, symmetrical, trachea midline Lungs: clear to auscultation bilaterally Heart: regular rate and rhythm, S1, S2 normal, no murmur, click, rub or gallop Abdomen: soft, non-tender; bowel sounds normal; no masses,  no organomegaly Extremities: extremities normal, atraumatic, no cyanosis or edema Pulses: 2+ and symmetric Skin: Skin color, texture, turgor normal. No rashes or lesions Neurologic: Grossly normal    Labs on Admission:   Recent Labs  09/18/13 1658  NA 135  K 4.1  CL 97  CO2 27  GLUCOSE 193*  BUN 18  CREATININE 1.23*  CALCIUM 9.7    Recent Labs  09/18/13 1658  WBC 9.3  NEUTROABS 6.6  HGB 11.3*  HCT 33.7*  MCV 94.1  PLT 247    Radiological Exams on Admission: Dg Chest 2 View  09/18/2013   CLINICAL DATA:  Syncope.  EXAM: CHEST  2 VIEW  COMPARISON:  None.  FINDINGS: Heart size and mediastinal contours are within normal limits. Both lungs are clear. Visualized skeletal structures are unremarkable.  IMPRESSION: Negative chest.   Electronically Signed   By: Pamela [name] M.D.   On: 09/18/2013 17:37    Assessment/Plan  62 yo female with orthostatic hypotension, syncope from dehydration and has mild renal insufficiency  Principal Problem:   Orthostatic hypotension ivf overnight.  Repeat orthostatics in am.  Repeat trop but doubt any acs.  Reported to ed she had chest pain but she tells me its epigastric.  ekg nsr no acute changes.  Try to  place in bed near her husband.  Requesting something to help her sleep, ambien ordered.  Hold bp meds.  Active Problems:   HYPERTENSION, BENIGN SYSTEMIC   Insomnia   Syncope   Renal insufficiency, mild   Dehydration   Feeling grief    Pamela [name] A 09/18/2013, 10:22 PM

## 2013-09-19 DIAGNOSIS — I10 Essential (primary) hypertension: Secondary | ICD-10-CM

## 2013-09-19 DIAGNOSIS — M899 Disorder of bone, unspecified: Secondary | ICD-10-CM

## 2013-09-19 DIAGNOSIS — K649 Unspecified hemorrhoids: Secondary | ICD-10-CM

## 2013-09-19 LAB — VITAMIN B12: Vitamin B-12: 1248 pg/mL — ABNORMAL HIGH (ref 211–911)

## 2013-09-19 LAB — BASIC METABOLIC PANEL
BUN: 15 mg/dL (ref 6–23)
Calcium: 8.7 mg/dL (ref 8.4–10.5)
Creatinine, Ser: 0.93 mg/dL (ref 0.50–1.10)
GFR calc Af Amer: 75 mL/min — ABNORMAL LOW (ref >90)
GFR calc non Af Amer: 65 mL/min — ABNORMAL LOW (ref >90)
Glucose, Bld: 173 mg/dL — ABNORMAL HIGH (ref 70–99)
Potassium: 3.9 mEq/L (ref 3.5–5.1)

## 2013-09-19 LAB — CBC
HCT: 31.8 % — ABNORMAL LOW (ref 36.0–46.0)
Hemoglobin: 9.6 g/dL — ABNORMAL LOW (ref 12.0–15.0)
MCH: 31.7 pg (ref 26.0–34.0)
MCH: 32.1 pg (ref 26.0–34.0)
MCHC: 33.8 g/dL (ref 30.0–36.0)
MCHC: 34.3 g/dL (ref 30.0–36.0)
MCV: 93.5 fL (ref 78.0–100.0)
Platelets: 208 10*3/uL (ref 150–400)
Platelets: 198 10*3/uL (ref 150–400)
RDW: 13.2 % (ref 11.5–15.5)
RDW: 13.4 % (ref 11.5–15.5)
WBC: 10.7 10*3/uL — ABNORMAL HIGH (ref 4.0–10.5)

## 2013-09-19 LAB — OCCULT BLOOD X 1 CARD TO LAB, STOOL: Fecal Occult Bld: POSITIVE — AB

## 2013-09-19 LAB — IRON AND TIBC
Saturation Ratios: 19 % — ABNORMAL LOW (ref 20–55)
TIBC: 228 ug/dL — ABNORMAL LOW (ref 250–470)
UIBC: 185 ug/dL (ref 125–400)

## 2013-09-19 LAB — CLOSTRIDIUM DIFFICILE BY PCR: Toxigenic C. Difficile by PCR: NEGATIVE

## 2013-09-19 LAB — GLUCOSE, CAPILLARY: Glucose-Capillary: 154 mg/dL — ABNORMAL HIGH (ref 70–99)

## 2013-09-19 LAB — TRANSFERRIN: Transferrin: 167 mg/dL — ABNORMAL LOW (ref 200–360)

## 2013-09-19 MED ORDER — PANTOPRAZOLE SODIUM 40 MG PO TBEC
40.0000 mg | DELAYED_RELEASE_TABLET | Freq: Every day | ORAL | Status: DC
  Administered 2013-09-19: 40 mg via ORAL
  Filled 2013-09-19: qty 1

## 2013-09-19 MED ORDER — HYDROCORTISONE ACETATE 25 MG RE SUPP
25.0000 mg | Freq: Two times a day (BID) | RECTAL | Status: DC
  Administered 2013-09-19 – 2013-09-21 (×5): 25 mg via RECTAL
  Filled 2013-09-19 (×6): qty 1

## 2013-09-19 MED ORDER — HYDROCORTISONE ACETATE 25 MG RE SUPP: 25.0000 mg | Freq: Two times a day (BID) | RECTAL | Status: DC

## 2013-09-19 MED ORDER — INFLUENZA VAC SPLIT QUAD 0.5 ML IM SUSP
0.5000 mL | INTRAMUSCULAR | Status: AC
  Administered 2013-09-20: 0.5 mL via INTRAMUSCULAR
  Filled 2013-09-19 (×2): qty 0.5

## 2013-09-19 MED ORDER — MORPHINE SULFATE 2 MG/ML IJ SOLN
0.5000 mg | INTRAMUSCULAR | Status: DC | PRN
  Administered 2013-09-19: 0.5 mg via INTRAVENOUS
  Filled 2013-09-19: qty 1

## 2013-09-19 MED ORDER — LORAZEPAM 0.5 MG PO TABS: 0.5000 mg | ORAL_TABLET | Freq: Three times a day (TID) | ORAL | Status: DC | PRN

## 2013-09-19 MED ORDER — LORAZEPAM 0.5 MG PO TABS
0.5000 mg | ORAL_TABLET | ORAL | Status: DC | PRN
  Administered 2013-09-19 – 2013-09-21 (×2): 0.5 mg via ORAL
  Filled 2013-09-19 (×2): qty 1

## 2013-09-19 MED ORDER — TRAMADOL HCL 50 MG PO TABS: 50.0000 mg | ORAL_TABLET | Freq: Four times a day (QID) | ORAL | Status: DC | PRN

## 2013-09-19 MED ORDER — ENSURE COMPLETE PO LIQD
237.0000 mL | ORAL | Status: DC
  Administered 2013-09-19: 237 mL via ORAL

## 2013-09-19 MED ORDER — PANTOPRAZOLE SODIUM 40 MG PO TBEC
40.0000 mg | DELAYED_RELEASE_TABLET | Freq: Two times a day (BID) | ORAL | Status: DC
  Administered 2013-09-19 – 2013-09-21 (×3): 40 mg via ORAL
  Filled 2013-09-19 (×7): qty 1

## 2013-09-19 MED ORDER — GLUCERNA SHAKE PO LIQD
237.0000 mL | Freq: Two times a day (BID) | ORAL | Status: DC
  Administered 2013-09-20 – 2013-09-21 (×2): 237 mL via ORAL
  Filled 2013-09-19 (×4): qty 237

## 2013-09-19 MED ORDER — SUCRALFATE 1 GM/10ML PO SUSP
1.0000 g | Freq: Three times a day (TID) | ORAL | Status: DC
  Administered 2013-09-19 – 2013-09-21 (×8): 1 g via ORAL
  Filled 2013-09-19 (×12): qty 10

## 2013-09-19 MED ORDER — OMEPRAZOLE 40 MG PO CPDR: 40.0000 mg | DELAYED_RELEASE_CAPSULE | Freq: Every day | ORAL | Status: DC

## 2013-09-19 MED ORDER — INSULIN ASPART 100 UNIT/ML ~~LOC~~ SOLN
0.0000 [IU] | Freq: Three times a day (TID) | SUBCUTANEOUS | Status: DC
  Administered 2013-09-19 – 2013-09-20 (×2): 2 [IU] via SUBCUTANEOUS
  Administered 2013-09-20: 1 [IU] via SUBCUTANEOUS
  Administered 2013-09-21: 2 [IU] via SUBCUTANEOUS
  Administered 2013-09-21: 1 [IU] via SUBCUTANEOUS

## 2013-09-19 MED ORDER — TRAMADOL HCL 50 MG PO TABS
50.0000 mg | ORAL_TABLET | Freq: Four times a day (QID) | ORAL | Status: DC | PRN
  Administered 2013-09-19 – 2013-09-20 (×3): 50 mg via ORAL
  Filled 2013-09-19 (×3): qty 1

## 2013-09-19 MED ORDER — INSULIN ASPART 100 UNIT/ML ~~LOC~~ SOLN: 0.0000 [IU] | Freq: Every day | SUBCUTANEOUS | Status: DC

## 2013-09-19 MED ORDER — SODIUM CHLORIDE 0.9 % IV BOLUS (SEPSIS)
1000.0000 mL | Freq: Once | INTRAVENOUS | Status: AC
  Administered 2013-09-19: 1000 mL via INTRAVENOUS

## 2013-09-19 MED ORDER — ACETAMINOPHEN 325 MG PO TABS
650.0000 mg | ORAL_TABLET | Freq: Four times a day (QID) | ORAL | Status: DC | PRN
  Administered 2013-09-19 (×2): 650 mg via ORAL
  Filled 2013-09-19 (×2): qty 2

## 2013-09-19 MED ORDER — SODIUM CHLORIDE 0.9 % IV SOLN
INTRAVENOUS | Status: DC
  Administered 2013-09-19 – 2013-09-21 (×3): via INTRAVENOUS

## 2013-09-19 MED ORDER — GI COCKTAIL ~~LOC~~
30.0000 mL | Freq: Three times a day (TID) | ORAL | Status: DC | PRN
  Administered 2013-09-19: 30 mL via ORAL
  Filled 2013-09-19: qty 30

## 2013-09-19 NOTE — Progress Notes (Signed)
Collection for Troponin rescheduled for now then every 6 hours for 2 occurences as previous ordered. Phlebotomy aware of lab draw.

## 2013-09-19 NOTE — Progress Notes (Signed)
INITIAL NUTRITION ASSESSMENT  DOCUMENTATION CODES Per approved criteria  -Not Applicable   INTERVENTION: Provide Ensure Complete once daily Provide Glucerna Shakes BID Encourage PO intake as tolerated  NUTRITION DIAGNOSIS: Inadequate oral intake related to abdominal pain as evidenced by pt only eating a few bites of food at each meal.   Goal: Pt to meet >/= 90% of their estimated nutrition needs  Monitor:  PO intake Weight Labs  Reason for Assessment: Malnutrition Screening Tool, score of 2  62 y.o. female  Admitting Dx: Orthostatic hypotension  ASSESSMENT: 62 yo female was going to the bathroom when she got dizzy and passed out, family member caught her before she fell. Came too and had a episode of n/v. Pt has been with her husband for several days who is dying from lung cancer on the fourth floor, she has not eaten or slept in days. Syncopal episode was brief. Found to have significant orthostatis on arrival to ED. Has received 2 liter of ivf. Also had some epigastric abd pain after syncope, she is hungry and her stomach hurts but she cannot eat. Pt has history of hypertension, hypercholesteremia, and diabetes mellitus without complication.   Pt reports not eating for the past few days due to her husband being hospitalized and abdominal pain. She reports having a few bites of grits for breakfast and a few bites of a sandwich for lunch today. Pt denies wt loss and states she was eating well prior to 4 days ago. Pt interested in receiving Ensure supplements until her appetite and abdominal pain improve.  Height: Ht Readings from Last 1 Encounters:  09/19/13 5\' 5"  (1.651 m)    Weight: Wt Readings from Last 1 Encounters:  09/19/13 130 lb 8 oz (59.194 kg)    Ideal Body Weight: 125 lbs  % Ideal Body Weight: 104%  Wt Readings from Last 10 Encounters:  09/19/13 130 lb 8 oz (59.194 kg)  07/21/13 128 lb 1.6 oz (58.106 kg)  03/07/13 128 lb 8 oz (58.287 kg)  09/24/12 127 lb  (57.607 kg)  01/27/12 126 lb 4.8 oz (57.289 kg)  07/22/11 123 lb (55.792 kg)  12/27/10 123 lb (55.792 kg)  05/24/10 124 lb (56.246 kg)  01/23/10 127 lb 8 oz (57.834 kg)  11/26/09 132 lb (59.875 kg)    Usual Body Weight: 128 lbs  % Usual Body Weight: 102%  BMI:  Body mass index is 21.72 kg/(m^2).  Estimated Nutritional Needs: Kcal: 1500-1700 Protein: 60-70 grams Fluid: 1.8-2 L/day  Skin: WDL  Diet Order: Carb Control  EDUCATION NEEDS: -No education needs identified at this time   Intake/Output Summary (Last 24 hours) at 09/19/13 1526 Last data filed at 09/19/13 1300  Gross per 24 hour  Intake   1360 ml  Output    700 ml  Net    660 ml    Last BM: 10/13   Labs:   Recent Labs Lab 09/18/13 1658 09/19/13 0254  NA 135 135  K 4.1 3.9  CL 97 103  CO2 27 26  BUN 18 15  CREATININE 1.23* 0.93  CALCIUM 9.7 8.7  GLUCOSE 193* 173*    CBG (last 3)   Recent Labs  09/18/13 1704 09/19/13 0746 09/19/13 1210  GLUCAP 188* 136* 154*    Scheduled Meds: . hydrocortisone  25 mg Rectal BID  . insulin aspart  0-5 Units Subcutaneous QHS  . insulin aspart  0-9 Units Subcutaneous TID WC  . pantoprazole  40 mg Oral BID AC  .  sodium chloride  3 mL Intravenous Q12H  . sucralfate  1 g Oral TID WC & HS    Continuous Infusions:   Past Medical History  Diagnosis Date  . Hypertension   . Diabetes mellitus without complication   . Hypercholesteremia     No past surgical history on file.  Ian Malkin RD, LDN Inpatient Clinical Dietitian Pager: [phone] After Hours Pager: [phone]

## 2013-09-19 NOTE — Progress Notes (Signed)
Assuming care of patient. Received report from Wilkie Aye, RN. Agree with previous assessment. Darien Ramus, RN

## 2013-09-19 NOTE — Progress Notes (Signed)
09/19/13 1500  Clinical Encounter Type  Visited With Patient and family together (sister, pastor, friend/missionary from church)  Visit Type Initial;Spiritual support;Social support  Referral From Family  Spiritual Encounters  Spiritual Needs Emotional;Grief support (anticipatory grief care)  Stress Factors  Patient Stress Factors Loss of control;Major life changes;Health changes;Exhausted   Referred by pt's niece Asher Muir, who was at Ms Beach's husband's bedside down the hallway.  Ms Claus was very appreciative of pastoral presence, chaplain availability, and encouragement to practice self-care.  Per pt, she has been working to be so strong as her husband moves toward EOL that she got weak herself.  Her visitors and I all affirmed her need for self-care, for receiving others' support, and for being gentle with her own self-expectations as she prepares to say goodbye to her husband.  Referred to Chaplains Donnelly Stager and Ashley Mariner for follow-up support, but please also page as needs arise:  [phone].  Thank you.  [address] [place], [place] [phone]

## 2013-09-19 NOTE — Care Management Note (Signed)
    Page 1 of 1   09/19/2013     11:01:31 AM   CARE MANAGEMENT NOTE 09/19/2013  Patient:  Pamela [name], Pamela [name]   Account Number:  [id number]  Date Initiated:  09/19/2013  Documentation initiated by:  Lanier Clam  Subjective/Objective Assessment:   62 Y/O F ADMITTED W/SYNCOPE.     Action/Plan:   SPOUSE HERE IN HOSPITAL IN BED ON SAME FLOOR.   Anticipated DC Date:  09/19/2013   Anticipated DC Plan:  HOME/SELF CARE      DC Planning Services  CM consult      Choice offered to / List presented to:             Status of service:  In process, will continue to follow Medicare Important Message given?   (If response is "NO", the following Medicare IM given date fields will be blank) Date Medicare IM given:   Date Additional Medicare IM given:    Discharge Disposition:    Per UR Regulation:  Reviewed for med. necessity/level of care/duration of stay  If discussed at Long Length of Stay Meetings, dates discussed:    Comments:  09/19/13 Ivelisse Culverhouse RN,BSN NCM [phone] NO ANTICIPATED D/C NEEDS.

## 2013-09-19 NOTE — Progress Notes (Addendum)
Pt states she is constipated often but takes stool softener and laxative. She admits to straining earlier on yesterday when she had a BM at home. However since being admitted,  post syncope episode, she has noticed blood in her stool in several BM's, blood becoming more prevalent after each one. Writer seen watery, sedmented stool, all blood without any Coulson or tan ( stool)  color. Pt states she has not had any occurences like this before and seems  very concerned. She denies any pain with palpating abdomen but admits to stomach ache in the upper medial aspect of abdomen. Bowel sounds present in all 4 quadrants. Denies nausea presently. Last emesis episode was today. Pt states she take 1/2 of yellow aspirin daily.

## 2013-09-19 NOTE — Progress Notes (Signed)
[facility] PROGRESS NOTE  Pamela [name] NFA:[id number] DOB: 1951/11/11 DOA: 09/18/2013 PCP: Kevin Fenton, MD  Assessment/Plan  Orthostatic and vasovagal hypotension: Pamela [name] was admitted with syncope. Per history, she has been under a lot of stress recently. Right before she syncopized, she had been straining to have a bowel movement. Additionally, she had not been eating or drinking well. In the ER, her blood pressure dropped from 106/55 to 70/43 with orthostatics. She was given several liters of IVF boluses and continued of IVF overnight.  Chest discomfort: Ddx included ACS, PUD, gastritis/GERD. Telemetry demonstrated NSR. Her ECG remained stable with lateral T-wave inversions. Her troponins were negative. Her symptoms improved with GI cocktail. She was advised to use PPI daily and maalox prn. She was advised to avoid NSAIDs and diet recommendations were discussed and were included in her discharge paperwork.   Hemorrhoids: She had reported some bright blood in her stools and she was occult positive. This started after she had strained to have a large BM and she has history of hemorrhoids. Her hemoglobin dropped to the 10mg /dl range and remained stable and her bleeding slowed. It was only a few tablespoons total. She was advised to use stool softeners and anusol and follow up with her PCP. She reports a normal colonoscopy within the last 10 years.   Normocytic anemia, hemoglobin stable near 10mg /dl. She had iron studies, vitamin B12, folate, and TSH drawn and the results are still pending at the time of discharge.   HTN/HLD. Blood pressure improved and was stable OFF of her home blood pressure medications at this time. Her blood pressure will likely normalize this week. She should have it rechecked in 1 week by her primary care doctor who may restart this medication if indicated.   T2DM, stable.  Low dose SSI  Diet:  Diabetic diet Access:  PIV IVF:  yes Proph:  lovenox  Code  Status: full Family Communication: spoke with patient and several family members who were present at bedside Disposition Plan: pending improvement in abdominal pain   Consultants:  non  Procedures:  CXR  Antibiotics:  none   HPI/Subjective:  Patient states that she had a very hard BM yesterday prior to passing out and since then has had several BMs with bright red blood mixed in.  Very small volume of blood.  States lightheadedness has resolved.  Denies nausea, but has several epigastric pain when she eats even a small amount of food.  Denies vomiting this morning.    Objective: Filed Vitals:   09/19/13 0600 09/19/13 0605 09/19/13 0610 09/19/13 1420  BP: 112/46 124/54 110/47 136/56  Pulse: 89 95 120 84  Temp: 99.2 F (37.3 C)   99.2 F (37.3 C)  TempSrc: Oral   Oral  Resp: 20   18  Height:      Weight:      SpO2: 100%   98%    Intake/Output Summary (Last 24 hours) at 09/19/13 1616 Last data filed at 09/19/13 1300  Gross per 24 hour  Intake   1360 ml  Output    700 ml  Net    660 ml   Filed Weights   09/19/13 0020  Weight: 59.194 kg (130 lb 8 oz)    Exam:  General: AAF, no acute distress, lying in bed  HEENT: NCAT, MMM  Cardiovascular: RRR, normal S1, S2, no mrg, 2+ pulses, warm extremities  Respiratory: CTAB, no increased WOB  ABD: NABS, soft, nondistended, mild TTP diffusely without rebound  or guarding.  MSK: Normal tone and bulk, no LEE  Neuro: Grossly intact   Data Reviewed: Basic Metabolic Panel:  Recent Labs Lab 09/18/13 1658 09/19/13 0254  NA 135 135  K 4.1 3.9  CL 97 103  CO2 27 26  GLUCOSE 193* 173*  BUN 18 15  CREATININE 1.23* 0.93  CALCIUM 9.7 8.7   Liver Function Tests: No results found for this basename: AST, ALT, ALKPHOS, BILITOT, PROT, ALBUMIN,  in the last 168 hours No results found for this basename: LIPASE, AMYLASE,  in the last 168 hours No results found for this basename: AMMONIA,  in the last 168 hours CBC:  Recent  Labs Lab 09/18/13 1658 09/19/13 0254 09/19/13 1313  WBC 9.3 8.4 10.7*  NEUTROABS 6.6  --   --   HGB 11.3* 9.6* 10.9*  HCT 33.7* 28.4* 31.8*  MCV 94.1 93.7 93.5  PLT 247 208 198   Cardiac Enzymes:  Recent Labs Lab 09/19/13 0256 09/19/13 1312  TROPONINI <0.30 <0.30   BNP (last 3 results) No results found for this basename: PROBNP,  in the last 8760 hours CBG:  Recent Labs Lab 09/18/13 1704 09/19/13 0746 09/19/13 1210  GLUCAP 188* 136* 154*    Recent Results (from the past 240 hour(s))  CLOSTRIDIUM DIFFICILE BY PCR     Status: None   Collection Time    09/19/13  2:09 AM      Result Value Range Status   C difficile by pcr NEGATIVE  NEGATIVE Final   Comment: Performed at [facility]     Studies: Dg Chest 2 View  09/18/2013   CLINICAL DATA:  Syncope.  EXAM: CHEST  2 VIEW  COMPARISON:  None.  FINDINGS: Heart size and mediastinal contours are within normal limits. Both lungs are clear. Visualized skeletal structures are unremarkable.  IMPRESSION: Negative chest.   Electronically Signed   By: Drusilla Kanner M.D.   On: 09/18/2013 17:37    Scheduled Meds: . feeding supplement (ENSURE COMPLETE)  237 mL Oral Q24H  . [START ON 09/20/2013] feeding supplement (GLUCERNA SHAKE)  237 mL Oral BID BM  . hydrocortisone  25 mg Rectal BID  . insulin aspart  0-5 Units Subcutaneous QHS  . insulin aspart  0-9 Units Subcutaneous TID WC  . pantoprazole  40 mg Oral BID AC  . sodium chloride  3 mL Intravenous Q12H  . sucralfate  1 g Oral TID WC & HS   Continuous Infusions:   Principal Problem:   Orthostatic hypotension Active Problems:   HYPERTENSION, BENIGN SYSTEMIC   Insomnia   Syncope   Renal insufficiency, mild   Dehydration   Feeling grief    Time spent: 30 min    Adelise Buswell, [facility]  [facility] Pager [phone]. If 7PM-7AM, please contact night-coverage at [email or web address], password [facility] 09/19/2013, 4:16 PM  LOS: 1 day

## 2013-09-20 ENCOUNTER — Inpatient Hospital Stay (HOSPITAL_COMMUNITY): Payer: BC Managed Care – PPO

## 2013-09-20 ENCOUNTER — Encounter (HOSPITAL_COMMUNITY): Payer: Self-pay | Admitting: Radiology

## 2013-09-20 DIAGNOSIS — R1032 Left lower quadrant pain: Secondary | ICD-10-CM

## 2013-09-20 DIAGNOSIS — K649 Unspecified hemorrhoids: Secondary | ICD-10-CM

## 2013-09-20 DIAGNOSIS — E119 Type 2 diabetes mellitus without complications: Secondary | ICD-10-CM

## 2013-09-20 DIAGNOSIS — R112 Nausea with vomiting, unspecified: Secondary | ICD-10-CM

## 2013-09-20 LAB — COMPREHENSIVE METABOLIC PANEL WITH GFR
ALT: 10 U/L (ref 0–35)
AST: 12 U/L (ref 0–37)
Albumin: 3.2 g/dL — ABNORMAL LOW (ref 3.5–5.2)
Alkaline Phosphatase: 80 U/L (ref 39–117)
BUN: 7 mg/dL (ref 6–23)
CO2: 27 meq/L (ref 19–32)
Calcium: 9.2 mg/dL (ref 8.4–10.5)
Chloride: 104 meq/L (ref 96–112)
Creatinine, Ser: 0.76 mg/dL (ref 0.50–1.10)
GFR calc Af Amer: >90 mL/min
GFR calc non Af Amer: 89 mL/min — ABNORMAL LOW
Glucose, Bld: 138 mg/dL — ABNORMAL HIGH (ref 70–99)
Potassium: 3.5 meq/L (ref 3.5–5.1)
Sodium: 137 meq/L (ref 135–145)
Total Bilirubin: 0.7 mg/dL (ref 0.3–1.2)
Total Protein: 6.8 g/dL (ref 6.0–8.3)

## 2013-09-20 LAB — CBC
HCT: 30.4 % — ABNORMAL LOW (ref 36.0–46.0)
Hemoglobin: 10.2 g/dL — ABNORMAL LOW (ref 12.0–15.0)
MCH: 31.7 pg (ref 26.0–34.0)
MCHC: 33.6 g/dL (ref 30.0–36.0)
MCV: 94.4 fL (ref 78.0–100.0)
Platelets: 212 K/uL (ref 150–400)
RBC: 3.22 MIL/uL — ABNORMAL LOW (ref 3.87–5.11)
RDW: 13.3 % (ref 11.5–15.5)
WBC: 13.1 K/uL — ABNORMAL HIGH (ref 4.0–10.5)

## 2013-09-20 LAB — FOLATE RBC: RBC Folate: 768 ng/mL — ABNORMAL HIGH (ref >=366)

## 2013-09-20 LAB — GLUCOSE, CAPILLARY
Glucose-Capillary: 137 mg/dL — ABNORMAL HIGH (ref 70–99)
Glucose-Capillary: 73 mg/dL (ref 70–99)

## 2013-09-20 MED ORDER — CIPROFLOXACIN IN D5W 400 MG/200ML IV SOLN
400.0000 mg | Freq: Two times a day (BID) | INTRAVENOUS | Status: DC
  Administered 2013-09-20 – 2013-09-21 (×3): 400 mg via INTRAVENOUS
  Filled 2013-09-20 (×4): qty 200

## 2013-09-20 MED ORDER — PROMETHAZINE HCL 25 MG/ML IJ SOLN
12.5000 mg | Freq: Four times a day (QID) | INTRAMUSCULAR | Status: DC | PRN
  Administered 2013-09-20: 12.5 mg via INTRAVENOUS
  Filled 2013-09-20: qty 1

## 2013-09-20 MED ORDER — IOHEXOL 300 MG/ML  SOLN
25.0000 mL | INTRAMUSCULAR | Status: AC
  Administered 2013-09-20: 25 mL via ORAL

## 2013-09-20 MED ORDER — METRONIDAZOLE IN NACL 5-0.79 MG/ML-% IV SOLN
500.0000 mg | Freq: Three times a day (TID) | INTRAVENOUS | Status: DC
  Administered 2013-09-20 – 2013-09-21 (×4): 500 mg via INTRAVENOUS
  Filled 2013-09-20 (×5): qty 100

## 2013-09-20 MED ORDER — IOHEXOL 300 MG/ML  SOLN
100.0000 mL | Freq: Once | INTRAMUSCULAR | Status: AC | PRN
  Administered 2013-09-20: 100 mL via INTRAVENOUS

## 2013-09-20 MED ORDER — LOPERAMIDE HCL 2 MG PO CAPS: 2.0000 mg | ORAL_CAPSULE | ORAL | Status: DC | PRN

## 2013-09-20 MED ORDER — ONDANSETRON HCL 4 MG/2ML IJ SOLN
4.0000 mg | Freq: Four times a day (QID) | INTRAMUSCULAR | Status: DC | PRN
  Administered 2013-09-20 (×2): 4 mg via INTRAVENOUS
  Filled 2013-09-20 (×2): qty 2

## 2013-09-20 NOTE — Progress Notes (Addendum)
[facility] PROGRESS NOTE  Pamela [name] WUJ:[id number] DOB: Sep 30, 1951 DOA: 09/18/2013 PCP: Kevin Fenton, MD  Summary  62 yo F with HTN, HLD, T2DM p/w syncopal episode after straining to have a BM.  She was also orthostatic and under stress due to her husband's impending death.  She was admitted to observation and given IVF.  Epigastric pain with eating suggestive of PUD/gastritis or pancreatitis, although lipase normal.  She had some blood in her stools which was small volume and has started having diarrhea, worsening abdominal pain and now nausea and vomiting.  CT ab/p pending.  Stool studies pending.  Started cipro/flagyl.  Assessment/Plan  Orthostatic and vasovagal hypotension:  Resolving. -  Continue IVF  Chest discomfort: Ddx included ACS, PUD, gastritis/GERD. Most likely PUD or gastritis.  Lipase and LFTs within normal limits.  Telemetry demonstrated NSR. Her ECG remained stable with lateral T-wave inversions. Her troponins were negative. Her symptoms improved with GI cocktail.  -  Continue PPI and carafate -  H. Pylori stool ag.  Abdominal pain with nausea, vomiting, and diarrhea, worse since admission.  Pain is located in LLQ.  HR and WBC trending up suggesting infection.  DDx includes diverticulitis, colitis (ischemic or infectious), UTI, gastroenteritis. -  IVF -  Continue zofran and add phenergan -  LFTs and lipase:  Negative and stable -  Urinalysis -  CT abd/pelvis -  Clear liquids -  Start cipro/flagyl -  Stool cultures and O&P -  C. Diff neg  BRBPR.  Pt has hx of hemorrhoids and had strained to have a large firm BM prior to this starting.  She has since had diarrhea so this may also reflect ischemic colitis, diverticulitis, infectious diarrhea, AVM.  Hgb stable.  -  Still having some streaks : She had reported some bright blood in her stools and she was occult positive. This started after she had strained to have a large BM and she has history of hemorrhoids.  Her hemoglobin dropped to the 10mg /dl range and remained stable and her bleeding slowed. It was only a few tablespoons total. She was advised to use stool softeners and anusol and follow up with her PCP. She reports a normal colonoscopy within the last 10 years.   Normocytic anemia, hemoglobin stable near 10mg /dl.  -  Anemia of chronic disease -  Vitamin B12 wnl -  Folate pending -  TSH wnl  HTN/HLD. Blood pressure stable OFF of her home blood pressure medications. -  Continue to hold BP medications  T2DM, CBGs stable.  Low dose SSI  Diet:  Diabetic diet Access:  PIV IVF:  yes Proph:  lovenox  Code Status: full Family Communication: spoke with patient and several family members who were present at bedside Disposition Plan: pending improvement in abdominal pain   Consultants:  non  Procedures:  CXR  Antibiotics:  none   HPI/Subjective:  Lightheadedness has improved, however, she has worsening abdominal pain that now localizes to the left lower quadrant.  Had several episodes of vomiting of nonbilious, nonbloody material since this morning.  Still having diarrhea with some mixed in bright red blood (small volume).   Objective: Filed Vitals:   09/19/13 2209 09/20/13 0513 09/20/13 0514 09/20/13 0516  BP: 124/56 142/67 141/75 138/63  Pulse: 90 104 104 111  Temp: 98.2 F (36.8 C) 98.9 F (37.2 C)    TempSrc: Oral Oral    Resp: 18 18    Height:      Weight:  SpO2: 97% 99% 100% 100%    Intake/Output Summary (Last 24 hours) at 09/20/13 1100 Last data filed at 09/20/13 0700  Gross per 24 hour  Intake   1655 ml  Output    550 ml  Net   1105 ml   Filed Weights   09/19/13 0020  Weight: 59.194 kg (130 lb 8 oz)    Exam:  General: AAF, no acute distress, lying in bed, emesis bag next to bed with several  HEENT: NCAT, MMM  Cardiovascular: RRR, normal S1, S2, no mrg, 2+ pulses, warm extremities  Respiratory: CTAB, no increased WOB  ABD: Hyperactive BS, soft,  nondistended, TTP LLQ along palpable descending colon without rebound or guarding.  MSK: Normal tone and bulk, no LEE  Neuro: Grossly intact   Data Reviewed: Basic Metabolic Panel:  Recent Labs Lab 09/18/13 1658 09/19/13 0254 09/20/13 0928  NA 135 135 137  K 4.1 3.9 3.5  CL 97 103 104  CO2 27 26 27   GLUCOSE 193* 173* 138*  BUN 18 15 7   CREATININE 1.23* 0.93 0.76  CALCIUM 9.7 8.7 9.2   Liver Function Tests:  Recent Labs Lab 09/20/13 0928  AST 12  ALT 10  ALKPHOS 80  BILITOT 0.7  PROT 6.8  ALBUMIN 3.2*    Recent Labs Lab 09/20/13 0928  LIPASE 16   No results found for this basename: AMMONIA,  in the last 168 hours CBC:  Recent Labs Lab 09/18/13 1658 09/19/13 0254 09/19/13 1313 09/20/13 0928  WBC 9.3 8.4 10.7* 13.1*  NEUTROABS 6.6  --   --   --   HGB 11.3* 9.6* 10.9* 10.2*  HCT 33.7* 28.4* 31.8* 30.4*  MCV 94.1 93.7 93.5 94.4  PLT 247 208 198 212   Cardiac Enzymes:  Recent Labs Lab 09/19/13 0256 09/19/13 1312  TROPONINI <0.30 <0.30   BNP (last 3 results) No results found for this basename: PROBNP,  in the last 8760 hours CBG:  Recent Labs Lab 09/19/13 0746 09/19/13 1210 09/19/13 1709 09/19/13 2154 09/20/13 0729  GLUCAP 136* 154* 164* 130* 137*    Recent Results (from the past 240 hour(s))  CLOSTRIDIUM DIFFICILE BY PCR     Status: None   Collection Time    09/19/13  2:09 AM      Result Value Range Status   C difficile by pcr NEGATIVE  NEGATIVE Final   Comment: Performed at [facility]     Studies: Dg Chest 2 View  09/18/2013   CLINICAL DATA:  Syncope.  EXAM: CHEST  2 VIEW  COMPARISON:  None.  FINDINGS: Heart size and mediastinal contours are within normal limits. Both lungs are clear. Visualized skeletal structures are unremarkable.  IMPRESSION: Negative chest.   Electronically Signed   By: Drusilla Kanner M.D.   On: 09/18/2013 17:37    Scheduled Meds: . feeding supplement (ENSURE COMPLETE)  237 mL Oral Q24H  .  feeding supplement (GLUCERNA SHAKE)  237 mL Oral BID BM  . hydrocortisone  25 mg Rectal BID  . influenza vac split quadrivalent PF  0.5 mL Intramuscular Tomorrow-1000  . insulin aspart  0-5 Units Subcutaneous QHS  . insulin aspart  0-9 Units Subcutaneous TID WC  . metronidazole  500 mg Intravenous Q8H  . pantoprazole  40 mg Oral BID AC  . sodium chloride  3 mL Intravenous Q12H  . sucralfate  1 g Oral TID WC & HS   Continuous Infusions: . sodium chloride 75 mL/hr at 09/20/13 0645  Principal Problem:   Orthostatic hypotension Active Problems:   HYPERTENSION, BENIGN SYSTEMIC   Insomnia   Syncope   Renal insufficiency, mild   Dehydration   Feeling grief   Hemorrhoids    Time spent: 30 min    Hattie Aguinaldo, [facility]  [facility] Pager [phone]. If 7PM-7AM, please contact night-coverage at [email or web address], password [facility] 09/20/2013, 11:00 AM  LOS: 2 days

## 2013-09-20 NOTE — Progress Notes (Signed)
ANTIBIOTIC CONSULT NOTE - INITIAL  Pharmacy Consult for Cipro Indication: diarrhea, diverticulitis  No Known Allergies  Patient Measurements: Height: 5\' 5"  (165.1 cm) Weight: 130 lb 8 oz (59.194 kg) IBW/kg (Calculated) : 57  Vital Signs: Temp: 98.9 F (37.2 C) (10/14 0513) Temp src: Oral (10/14 0513) BP: 138/63 mmHg (10/14 0516) Pulse Rate: 111 (10/14 0516) Intake/Output from previous day: 10/13 0701 - 10/14 0700 In: 1775 [P.O.:720; I.V.:1055] Out: 750 [Urine:750]  Labs:  Recent Labs  09/18/13 1658 09/19/13 0254 09/19/13 1313 09/20/13 0928  WBC 9.3 8.4 10.7* 13.1*  HGB 11.3* 9.6* 10.9* 10.2*  PLT 247 208 198 212  CREATININE 1.23* 0.93  --  0.76   Estimated Creatinine Clearance: 65.6 ml/min (by C-G formula based on Cr of 0.76).   Microbiology: Recent Results (from the past 720 hour(s))  CLOSTRIDIUM DIFFICILE BY PCR     Status: None   Collection Time    09/19/13  2:09 AM      Result Value Range Status   C difficile by pcr NEGATIVE  NEGATIVE Final   Comment: Performed at [facility]    Medical History: Past Medical History  Diagnosis Date  . Hypertension   . Diabetes mellitus without complication   . Hypercholesteremia     Medications:  Anti-infectives   Start     Dose/Rate Route Frequency Ordered Stop   09/20/13 1100  metroNIDAZOLE (FLAGYL) IVPB 500 mg     500 mg 100 mL/hr over 60 Minutes Intravenous Every 8 hours 09/20/13 1058       Assessment: 62 yo F admitted 10/12 with syncope, hypotension, dehydration, chest pain.  PMH includes hemorrhoids and pt had heme + stool on 10/13 after straining to have a large BM.  Pt now reports increasing abdominal pain, N/V/D worsening since admission.  Pharmacy is asked to dose Cipro for possible infection.  D1 Flagyl (per MD dosing) and Cipro (per pharmacy dosing)  10/13 Cdiff negative  WBC 13.1 (increasing: 9.3 > 8.4 > 10.7 > 13.1)  SCr improved to 0.76, CrCl ~ 65 ml/min  Goal of Therapy:   Appropriate abx dosing, eradication of infection.   Plan:   Cipro 400mg  IV q12h  Continue metronidazole per MD dosing.  Follow up renal fxn and culture results.  Lynann Beaver PharmD, BCPS Pager [phone] 09/20/2013 11:06 AM

## 2013-09-20 NOTE — Progress Notes (Signed)
Pt states she feels much, much better this evening.  After administered phenergan, patient slept for a little while, and woke up feeling great (per patient).  Will continue to monitor.

## 2013-09-20 NOTE — Progress Notes (Signed)
Patient continues to complain of low sharp abdominal pain.  Medicated with prn tramadol.  Patient also had two episodes of nausea and vomiting.  Notified NP on call and order for prn Zofran provided and administered.  Patient walked to visit husband in room 1423 early this morning and heart rate would increase while patient was up waking.  Heart rate ranged from 120-140.  Patient denied feeling any different when up ambulating.  Will continue to monitor patient.

## 2013-09-21 DIAGNOSIS — K5732 Diverticulitis of large intestine without perforation or abscess without bleeding: Principal | ICD-10-CM

## 2013-09-21 DIAGNOSIS — K5792 Diverticulitis of intestine, part unspecified, without perforation or abscess without bleeding: Secondary | ICD-10-CM

## 2013-09-21 LAB — GLUCOSE, CAPILLARY
Glucose-Capillary: 136 mg/dL — ABNORMAL HIGH (ref 70–99)
Glucose-Capillary: 142 mg/dL — ABNORMAL HIGH (ref 70–99)
Glucose-Capillary: 153 mg/dL — ABNORMAL HIGH (ref 70–99)

## 2013-09-21 LAB — URINALYSIS, ROUTINE W REFLEX MICROSCOPIC
Bilirubin Urine: NEGATIVE
Glucose, UA: NEGATIVE mg/dL
Hgb urine dipstick: NEGATIVE
Ketones, ur: NEGATIVE mg/dL
Nitrite: NEGATIVE
Specific Gravity, Urine: 1.013 (ref 1.005–1.030)
pH: 5.5 (ref 5.0–8.0)

## 2013-09-21 LAB — URINE MICROSCOPIC-ADD ON

## 2013-09-21 MED ORDER — METRONIDAZOLE 500 MG PO TABS: 500.0000 mg | ORAL_TABLET | Freq: Three times a day (TID) | ORAL | Status: DC

## 2013-09-21 MED ORDER — ALPRAZOLAM 0.5 MG PO TABS: 0.5000 mg | ORAL_TABLET | Freq: Three times a day (TID) | ORAL | Status: DC | PRN

## 2013-09-21 MED ORDER — CIPROFLOXACIN HCL 500 MG PO TABS: 500.0000 mg | ORAL_TABLET | Freq: Two times a day (BID) | ORAL | Status: DC

## 2013-09-21 NOTE — Discharge Summary (Addendum)
Physician Discharge Summary  Pamela [name] ZOX:[id number] DOB: Apr 11, 1951 DOA: 09/18/2013  PCP: Kevin Fenton, MD  Admit date: 09/18/2013 Discharge date: 09/21/2013  Time spent: 40 minutes  Recommendations for Outpatient Follow-up:  1. Follow up with PCP in 1-2 weeks  Discharge Diagnoses:  Acute Diverticulitis  Principal Problem:   Acute diverticulitis Active Problems:   HYPERTENSION, BENIGN SYSTEMIC   Insomnia   Syncope   Orthostatic hypotension   Renal insufficiency, mild   Dehydration   Feeling grief   Hemorrhoids   Nausea and vomiting   LLQ abdominal pain   Discharge Condition: Improved  Diet recommendation: Regular  Filed Weights   09/19/13 0020  Weight: 59.194 kg (130 lb 8 oz)    History of present illness:  62 yo female was going to the bathroom when she got dizzy and passed out, family member caught her before she fell. Came too and had a episode of n/v. Pt has been with her husband for several days who is dying from lung cancer on the fourth floor, she has not eaten or slept in days. Syncopal episode was brief. Found to have significant orthostatis on arrival to ED. Has received 2 liter of ivf. Also had some epigastric abd pain after syncope, she is hungry and her stomach hurts but she cannot eat. No fevers. No diarrhea.  Hospital Course:  Orthostatic and vasovagal hypotension: Resolving.  - Continue IVF   Chest discomfort: Ddx included ACS, PUD, gastritis/GERD. Most likely PUD or gastritis. Lipase and LFTs within normal limits. Telemetry demonstrated NSR. Her ECG remained stable with lateral T-wave inversions. Her troponins were negative. Her symptoms improved with GI cocktail.  - Continue PPI and carafate  - H. Pylori stool ag.   Abdominal pain with nausea, vomiting, and diarrhea, worse since admission. Pain is located in LLQ. HR and WBC trending up suggesting infection. DDx includes diverticulitis, colitis (ischemic or infectious), UTI, gastroenteritis.   - IVF  - Continue zofran and add phenergan  - LFTs and lipase: Negative and stable  - Urinalysis  - CT abd/pelvis  - Clear liquids  - Started cipro/flagyl  - Stool cultures and O&P  - C. Diff neg   BRBPR. Pt has hx of hemorrhoids and had strained to have a large firm BM prior to this starting. She has since had diarrhea so this may also reflect ischemic colitis, diverticulitis, infectious diarrhea, AVM. Hgb stable.  - Still having some streaks : She had reported some bright blood in her stools and she was occult positive. This started after she had strained to have a large BM and she has history of hemorrhoids. Her hemoglobin dropped to the 10mg /dl range and remained stable and her bleeding slowed. It was only a few tablespoons total. She was advised to use stool softeners and anusol and follow up with her PCP. She reports a normal colonoscopy within the last 10 years.  Normocytic anemia, hemoglobin stable near 10mg /dl.  - Anemia of chronic disease  - Vitamin B12 wnl  - Folate pending  - TSH wnl   HTN/HLD. Blood pressure stable OFF of her home blood pressure medications.  - Continue to hold BP medications   T2DM, CBGs stable. Low dose SSI   Discharge Exam: Filed Vitals:   09/21/13 0503 09/21/13 0504 09/21/13 0506 09/21/13 0507  BP: 122/61 132/65 127/65 124/67  Pulse: 86 92 97 94  Temp: 99.1 F (37.3 C)     TempSrc: Oral     Resp: 18  Height:      Weight:      SpO2: 100% 100% 100% 100%    General: Awake, in nad Cardiovascular: regular, s1, s2 Respiratory: normal resp effort, no wheezing  Discharge Instructions     Medication List    STOP taking these medications       aspirin EC 81 MG tablet     ibuprofen 200 MG tablet  Commonly known as:  ADVIL,MOTRIN     lisinopril-hydrochlorothiazide 20-25 MG per tablet  Commonly known as:  PRINZIDE,ZESTORETIC      TAKE these medications       ALPRAZolam 0.5 MG tablet  Commonly known as:  XANAX  Take 1 tablet (0.5  mg total) by mouth 3 (three) times daily as needed for sleep or anxiety.     ciprofloxacin 500 MG tablet  Commonly known as:  CIPRO  Take 1 tablet (500 mg total) by mouth 2 (two) times daily.     conjugated estrogens vaginal cream  Commonly known as:  PREMARIN  Use 0.5 - 2 gm every night in the vagina.  If your symptoms improve, you may decrease the use.     hydrocortisone 25 MG suppository  Commonly known as:  ANUSOL-HC  Place 1 suppository (25 mg total) rectally 2 (two) times daily.     LORazepam 0.5 MG tablet  Commonly known as:  ATIVAN  Take 1 tablet (0.5 mg total) by mouth every 8 (eight) hours as needed for anxiety.     metFORMIN 500 MG tablet  Commonly known as:  GLUCOPHAGE  Take 1 tablet (500 mg total) by mouth daily with breakfast.     metroNIDAZOLE 500 MG tablet  Commonly known as:  FLAGYL  Take 1 tablet (500 mg total) by mouth 3 (three) times daily.     omeprazole 40 MG capsule  Commonly known as:  PRILOSEC  Take 1 capsule (40 mg total) by mouth daily.     pravastatin 20 MG tablet  Commonly known as:  PRAVACHOL  Take 1 tablet (20 mg total) by mouth daily.     traMADol 50 MG tablet  Commonly known as:  ULTRAM  Take 1 tablet (50 mg total) by mouth every 6 (six) hours as needed.       No Known Allergies Follow-up Information   Follow up with Kevin Fenton, MD. Schedule an appointment as soon as possible for a visit in 1 week.   Specialty:  Family Medicine   Contact information:   [address] [place] [address] [phone]        The results of significant diagnostics from this hospitalization (including imaging, microbiology, ancillary and laboratory) are listed below for reference.    Significant Diagnostic Studies: Dg Chest 2 View  09/18/2013   CLINICAL DATA:  Syncope.  EXAM: CHEST  2 VIEW  COMPARISON:  None.  FINDINGS: Heart size and mediastinal contours are within normal limits. Both lungs are clear. Visualized skeletal  structures are unremarkable.  IMPRESSION: Negative chest.   Electronically Signed   By: Drusilla Kanner M.D.   On: 09/18/2013 17:37   Ct Abdomen Pelvis W Contrast  09/20/2013   CLINICAL DATA:  Abdominal pain. Diarrhea. Nausea and vomiting. Blood in stool.  EXAM: CT ABDOMEN AND PELVIS WITH CONTRAST  TECHNIQUE: Multidetector CT imaging of the abdomen and pelvis was performed using the standard protocol following bolus administration of intravenous contrast.  CONTRAST:  OMNIPAQUE IOHEXOL 300 MG/ML  SOLN  COMPARISON:  None.  FINDINGS: Colonic  diverticulosis is noted as well as mild to moderate wall thickening involving the descending and proximal sigmoid colon. There is mild pericolonic inflammatory changes as well as small amount of free fluid in the pelvis. This is consistent with diverticulitis. No evidence of abscess.  Prior hysterectomy noted. No adnexal mass identified. No other soft tissue masses or lymphadenopathy identified within the abdomen or pelvis.  The liver, gallbladder, pancreas, spleen, adrenal glands, and kidneys are normal in appearance. No evidence of dilated bowel loops. No suspicious bone lesions identified.  IMPRESSION: Mild to moderate diverticulitis involving the descending and proximal sigmoid colon.  Small amount of free fluid in the pelvis. No evidence of abscess or other complication.   Electronically Signed   By: Myles Rosenthal M.D.   On: 09/20/2013 15:48    Microbiology: Recent Results (from the past 240 hour(s))  CLOSTRIDIUM DIFFICILE BY PCR     Status: None   Collection Time    09/19/13  2:09 AM      Result Value Range Status   C difficile by pcr NEGATIVE  NEGATIVE Final   Comment: Performed at [facility]     Labs: Basic Metabolic Panel:  Recent Labs Lab 09/18/13 1658 09/19/13 0254 09/20/13 0928  NA 135 135 137  K 4.1 3.9 3.5  CL 97 103 104  CO2 27 26 27   GLUCOSE 193* 173* 138*  BUN 18 15 7   CREATININE 1.23* 0.93 0.76  CALCIUM 9.7 8.7 9.2    Liver Function Tests:  Recent Labs Lab 09/20/13 0928  AST 12  ALT 10  ALKPHOS 80  BILITOT 0.7  PROT 6.8  ALBUMIN 3.2*    Recent Labs Lab 09/20/13 0928  LIPASE 16   No results found for this basename: AMMONIA,  in the last 168 hours CBC:  Recent Labs Lab 09/18/13 1658 09/19/13 0254 09/19/13 1313 09/20/13 0928  WBC 9.3 8.4 10.7* 13.1*  NEUTROABS 6.6  --   --   --   HGB 11.3* 9.6* 10.9* 10.2*  HCT 33.7* 28.4* 31.8* 30.4*  MCV 94.1 93.7 93.5 94.4  PLT 247 208 198 212   Cardiac Enzymes:  Recent Labs Lab 09/19/13 0256 09/19/13 1312  TROPONINI <0.30 <0.30   BNP: BNP (last 3 results) No results found for this basename: PROBNP,  in the last 8760 hours CBG:  Recent Labs Lab 09/20/13 1229 09/20/13 1703 09/20/13 2212 09/21/13 0732 09/21/13 1149  GLUCAP 171* 73 136* 142* 153*    Signed:  Roverto Bodmer K  [facility] 09/21/2013, 2:22 PM

## 2013-09-21 NOTE — Progress Notes (Signed)
Per pt, a staff member collected a urine sample last night from her. However, there is no evidence of a sample being sent in epic. According to the chart, the UA still needs to be collected. Will ask pt to collect another sample.   Arta Bruce [facility] 09/21/2013

## 2013-09-21 NOTE — Progress Notes (Signed)
Ms Pamela [name] is the wife and widow of Mr Pamela [name] who died on September 21, 2013. The chaplain was paged at [phone] on Oct 15, 2024to be with family and friends gathered in room 1423 at the bedside of Mr Pamela [name]. Late in that visit he was told of Ms Pamela [name]'s presence in Room 1427 down the hall from where Mr Pamela [name]'s body lay.   Chaplain visited Ms Pamela [name] to provide comfort and grief counsel. She is deeply spiritual person in the [place] traditions. Her presence in the hospital is in part because of the stress of her husband's pending death. Family was at her bedside and she reports she is greatly supported by these.  RECOMMEND: Chaplain be paged if Ms Pamela [name] wishes further spiritual care tonight and that Daytime chaplains visit during daytime hours to assess how she is doing after her husband's death.  Pamela [name]. Pamela [name], DMin, MDiv Chaplain

## 2013-09-22 LAB — URINE CULTURE: Colony Count: NO GROWTH

## 2013-09-29 ENCOUNTER — Encounter: Payer: Self-pay | Admitting: Family Medicine

## 2013-09-29 ENCOUNTER — Ambulatory Visit (INDEPENDENT_AMBULATORY_CARE_PROVIDER_SITE_OTHER): Payer: BC Managed Care – PPO | Admitting: Family Medicine

## 2013-09-29 VITALS — BP 167/70 | HR 74 | Temp 98.6°F | Ht 63.0 in | Wt 134.0 lb

## 2013-09-29 DIAGNOSIS — G4700 Insomnia, unspecified: Secondary | ICD-10-CM

## 2013-09-29 DIAGNOSIS — D62 Acute posthemorrhagic anemia: Secondary | ICD-10-CM

## 2013-09-29 DIAGNOSIS — F4321 Adjustment disorder with depressed mood: Secondary | ICD-10-CM

## 2013-09-29 DIAGNOSIS — K5732 Diverticulitis of large intestine without perforation or abscess without bleeding: Secondary | ICD-10-CM

## 2013-09-29 DIAGNOSIS — K5792 Diverticulitis of intestine, part unspecified, without perforation or abscess without bleeding: Secondary | ICD-10-CM

## 2013-09-29 LAB — BASIC METABOLIC PANEL
Chloride: 108 mEq/L (ref 96–112)
Glucose, Bld: 107 mg/dL — ABNORMAL HIGH (ref 70–99)
Potassium: 3.9 mEq/L (ref 3.5–5.3)
Sodium: 143 mEq/L (ref 135–145)

## 2013-09-29 LAB — CBC WITH DIFFERENTIAL/PLATELET
Basophils Absolute: 0 10*3/uL (ref 0.0–0.1)
Basophils Relative: 0 % (ref 0–1)
Lymphocytes Relative: 40 % (ref 12–46)
MCHC: 33.1 g/dL (ref 30.0–36.0)
Neutro Abs: 2.6 10*3/uL (ref 1.7–7.7)
Neutrophils Relative %: 50 % (ref 43–77)
Platelets: 348 10*3/uL (ref 150–400)
RDW: 15.2 % (ref 11.5–15.5)
WBC: 5.1 10*3/uL (ref 4.0–10.5)

## 2013-09-29 MED ORDER — FERROUS SULFATE 324 (65 FE) MG PO TBEC: 1.0000 | DELAYED_RELEASE_TABLET | Freq: Two times a day (BID) | ORAL | Status: DC

## 2013-09-29 MED ORDER — TRAZODONE HCL 50 MG PO TABS: 50.0000 mg | ORAL_TABLET | Freq: Every evening | ORAL | Status: DC | PRN

## 2013-09-29 NOTE — Assessment & Plan Note (Signed)
From likely hemmrhoidal bleed vs diverticulaitis related bleeding in teh hosp Denies any more recurrence Given ferrous sulfate BID for 3 months Recheck cbc

## 2013-09-29 NOTE — Assessment & Plan Note (Signed)
Likely mood/stress related  Dc xanax Started 50-100 mg trazadone nightly

## 2013-09-29 NOTE — Assessment & Plan Note (Signed)
Abd pain resolved per pt Recommended finishing abx

## 2013-09-29 NOTE — Progress Notes (Addendum)
  Subjective:    Patient ID: Pamela [name], female    DOB: January 16, 1951, 62 y.o.   MRN: [id number]  HPI Patient here for hospital followup Was hospitalized for syncopal episode, BRB per rectum, diverticulitis. States that she is still finishing her antibiotics, has not had any more BRB per rectum or syncopal episodes, and her abdominal pain is resolved.   She was in the hospital with her husband Pamela [name] long when she syncopised and was admitted.  She notes that he died about 10 days ago. Since he became ill she's been having left-sided frequent headaches.   they're focused mostly on her left temporal area and she feels they're associated with stress.  She denies photosensitivity sound sensitivity or smell sensitivity. She also denies nausea or vomiting with her headaches. They occur mostly at night and prevented her from sleeping.   She has tried xanax that was given to her in the hosp for sleep but wakes upo in just a few hours and does not like the cloudy feeling she has.   She has had some mood chjanges in dealing with his death She scored a 9 today on her GAD-7 And a 10 on a PHQ-9 (0 on #9)   She has been having slight pedal edema She has not taken her HTN meds since leaving the hospital .   Review of Systems Per HPI    Objective:   Physical Exam  Gen: NAD, alert, cooperative with exam HEENT: NCAT, EOMI CV: RRR, good S1/S2, no murmur Resp: CTABL, no wheezes, non-labored Ext: trace edema on BL LE, warm Neuro: Alert and oriented, No gross deficits     Assessment & Plan:

## 2013-09-29 NOTE — Patient Instructions (Signed)
It was great to see you! Follow up in 2-3 weeks.   Try to talk to you pastor formally, make an appointment and talk about whats going on.   Start taking you rblood pressure medicine again,   Try these other pills for sleep and work on establishing a sleep routine. Take 1-2 before bed.

## 2013-09-29 NOTE — Assessment & Plan Note (Signed)
Adjustment d/o, likely contributing to her HA and insomnia Recommended counseling at church, as she is very involved there

## 2013-11-09 ENCOUNTER — Ambulatory Visit (INDEPENDENT_AMBULATORY_CARE_PROVIDER_SITE_OTHER): Payer: BC Managed Care – PPO | Admitting: Family Medicine

## 2013-11-09 ENCOUNTER — Encounter: Payer: Self-pay | Admitting: Family Medicine

## 2013-11-09 VITALS — BP 128/80 | HR 70 | Ht 63.0 in | Wt 120.0 lb

## 2013-11-09 DIAGNOSIS — D62 Acute posthemorrhagic anemia: Secondary | ICD-10-CM

## 2013-11-09 DIAGNOSIS — G4700 Insomnia, unspecified: Secondary | ICD-10-CM

## 2013-11-09 DIAGNOSIS — R634 Abnormal weight loss: Secondary | ICD-10-CM

## 2013-11-09 DIAGNOSIS — K625 Hemorrhage of anus and rectum: Secondary | ICD-10-CM

## 2013-11-09 DIAGNOSIS — F4321 Adjustment disorder with depressed mood: Secondary | ICD-10-CM

## 2013-11-09 LAB — CBC WITH DIFFERENTIAL/PLATELET
Basophils Absolute: 0 10*3/uL (ref 0.0–0.1)
Basophils Relative: 1 % (ref 0–1)
HCT: 34.8 % — ABNORMAL LOW (ref 36.0–46.0)
Hemoglobin: 11.8 g/dL — ABNORMAL LOW (ref 12.0–15.0)
Lymphocytes Relative: 40 % (ref 12–46)
Lymphs Abs: 2.2 10*3/uL (ref 0.7–4.0)
MCV: 96.1 fL (ref 78.0–100.0)
Monocytes Absolute: 0.4 10*3/uL (ref 0.1–1.0)
Monocytes Relative: 7 % (ref 3–12)
Neutro Abs: 2.9 10*3/uL (ref 1.7–7.7)
Platelets: 258 10*3/uL (ref 150–400)
RBC: 3.62 MIL/uL — ABNORMAL LOW (ref 3.87–5.11)
RDW: 13.9 % (ref 11.5–15.5)
WBC: 5.7 10*3/uL (ref 4.0–10.5)

## 2013-11-09 LAB — POCT H PYLORI SCREEN: H Pylori Screen, POC: NEGATIVE

## 2013-11-09 NOTE — Assessment & Plan Note (Signed)
Improving, had reaction after taking trazodone last night. Do not feel that the reaction was solely due to trazodone as her by mouth intake has been very poor. However I agree with patient, if she is unable to trazodone and it caused a problem we'll DC for now.  Most likely part of the grief reaction.

## 2013-11-09 NOTE — Progress Notes (Signed)
Patient ID: Pamela [name], female   DOB: July 15, 1951, 62 y.o.   MRN: [id number]  Kevin Fenton, MD Phone: [phone]  Subjective:  Chief complaint-noted  # 62 year old female here for followup of insomnia and rectal bleeding  Insomnia Improving, trazodone helping some. States last night she took 2 immediately began to feel nauseous noticed her heart is racing and got very sleepy. This event scared her granddaughter who called the anulus. Patient vomited times one and then began to feel better. EMS stated that the reaction was because of the trazodone and the patient would not stop the medication. As an aside she is beginning to sleep easier without the medication.  Rectal bleeding No more events, this time on more detailed questioning she states that she did have a few episodes of melena after she got home from the hospital. She denies current abdominal pain, and any recent rectal bleeding. She also denies recent melena.  Weight loss States she's lost several pounds since her last visit here. Notes the biggest changes in her appetite, she says it's been decreased since her husband died, and that wasn't very good before that.   Mood- feels like it is improvingslight depression, no anhedonia.  PHQ-9 score 4 - ROS- Per HPI  Past Medical History Patient Active Problem List   Diagnosis Date Noted  . Rectal bleeding 11/09/2013  . Acute blood loss anemia 09/29/2013  . Acute diverticulitis 09/21/2013  . Nausea and vomiting 09/20/2013  . LLQ abdominal pain 09/20/2013  . Hemorrhoids 09/19/2013  . Syncope 09/18/2013  . Orthostatic hypotension 09/18/2013  . Renal insufficiency, mild 09/18/2013  . Dehydration 09/18/2013  . Feeling grief 09/18/2013  . Insomnia 03/07/2013  . Perimenopausal atrophic vaginitis 01/27/2012  . Counseling on health promotion and disease prevention 07/29/2011  . CONSTIPATION 12/27/2010  . WEIGHT LOSS 05/24/2010  . FATIGUE 01/23/2010  . DIABETES MELLITUS,  TYPE II, WITHOUT COMPLICATIONS 11/06/2009  . HYPERLIPIDEMIA 02/04/2007  . HYPERTENSION, BENIGN SYSTEMIC 02/04/2007  . OSTEOPENIA 02/04/2007    Medications- reviewed and updated Current Outpatient Prescriptions  Medication Sig Dispense Refill  . ALPRAZolam (XANAX) 0.5 MG tablet Take 1 tablet (0.5 mg total) by mouth 3 (three) times daily as needed for sleep or anxiety.  30 tablet  0  . ciprofloxacin (CIPRO) 500 MG tablet Take 1 tablet (500 mg total) by mouth 2 (two) times daily.  20 tablet  0  . conjugated estrogens (PREMARIN) vaginal cream Use 0.5 - 2 gm every night in the vagina.  If your symptoms improve, you may decrease the use.  42.5 g  12  . ferrous sulfate 324 (65 FE) MG TBEC Take 1 tablet (325 mg total) by mouth 2 (two) times daily.  60 tablet  2  . hydrocortisone (ANUSOL-HC) 25 MG suppository Place 1 suppository (25 mg total) rectally 2 (two) times daily.  12 suppository  0  . LORazepam (ATIVAN) 0.5 MG tablet Take 1 tablet (0.5 mg total) by mouth every 8 (eight) hours as needed for anxiety.  20 tablet  0  . metFORMIN (GLUCOPHAGE) 500 MG tablet Take 1 tablet (500 mg total) by mouth daily with breakfast.  90 tablet  3  . metroNIDAZOLE (FLAGYL) 500 MG tablet Take 1 tablet (500 mg total) by mouth 3 (three) times daily.  30 tablet  0  . omeprazole (PRILOSEC) 40 MG capsule Take 1 capsule (40 mg total) by mouth daily.  30 capsule  0  . pravastatin (PRAVACHOL) 20 MG tablet Take 1 tablet (20 mg total)  by mouth daily.  90 tablet  3  . traMADol (ULTRAM) 50 MG tablet Take 1 tablet (50 mg total) by mouth every 6 (six) hours as needed.  30 tablet  0  . traZODone (DESYREL) 50 MG tablet Take 1-2 tablets (50-100 mg total) by mouth at bedtime as needed for sleep.  60 tablet  3   No current facility-administered medications for this visit.    Objective: BP 128/80  Pulse 70  Ht 5\' 3"  (1.6 m)  Wt 120 lb (54.432 kg)  BMI 21.26 kg/m2 Gen: NAD, alert, cooperative with exam HEENT: NCAT, EOMI,  PERRL CV: RRR, good S1/S2, no murmur Resp: CTABL, no wheezes, non-labored Abd: SNTND, BS present, no guarding or organomegaly Ext: No edema, warm Neuro: Alert and oriented, No gross deficits Rectal- no visible hemorrhoids, no masses felt, normal tone, guaiac negative.    Assessment/Plan:  Insomnia Improving, had reaction after taking trazodone last night. Do not feel that the reaction was solely due to trazodone as her by mouth intake has been very poor. However I agree with patient, if she is unable to trazodone and it caused a problem we'll DC for now.  Most likely part of the grief reaction.   WEIGHT LOSS Most likely from decreased appetite 2/2 grief reaction.  14 lb in 6 weeks.  With recent GI bleed and melena, will send to GI for c-scope to rule out GI Ca as a cause, and EGD if they feel its warranted.  Start BID glucerna Check CBC, stool guiac negative here today    Feeling grief Improving, PHQ-9 score 4 Likely cause of weight loss and decreased appetite F/u 1 month  Rectal bleeding Resolved since hospitalization, some episodes of melena since Has not had C scope since 2004 Guiac negative here today.  H pylori IgG today, continue omeprazole.  Will send to GI for evaluation, Needs 10 year c scope, will await their decision if EGD is necessary.     Orders Placed This Encounter  Procedures  . CBC with Differential  . Ambulatory referral to Gastroenterology    Referral Priority:  Routine    Referral Type:  Consultation    Referral Reason:  Specialty Services Required    Requested Specialty:  Gastroenterology    Number of Visits Requested:  1  . H. pylori Screen    No orders of the defined types were placed in this encounter.

## 2013-11-09 NOTE — Patient Instructions (Signed)
It was great to see you today!!  Start drinking 2 glucerna daily.   I have written a consult for a GI doctor.

## 2013-11-09 NOTE — Assessment & Plan Note (Signed)
Most likely from decreased appetite 2/2 grief reaction.  14 lb in 6 weeks.  With recent GI bleed and melena, will send to GI for c-scope to rule out GI Ca as a cause, and EGD if they feel its warranted.  Start BID glucerna Check CBC, stool guiac negative here today

## 2013-11-09 NOTE — Assessment & Plan Note (Signed)
Improving, PHQ-9 score 4 Likely cause of weight loss and decreased appetite F/u 1 month

## 2013-11-09 NOTE — Assessment & Plan Note (Signed)
Resolved since hospitalization, some episodes of melena since Has not had C scope since 2004 Guiac negative here today.  H pylori IgG today, continue omeprazole.  Will send to GI for evaluation, Needs 10 year c scope, will await their decision if EGD is necessary.

## 2013-11-17 ENCOUNTER — Encounter: Payer: Self-pay | Admitting: Gastroenterology

## 2013-12-22 ENCOUNTER — Encounter: Payer: Self-pay | Admitting: Gastroenterology

## 2013-12-22 ENCOUNTER — Ambulatory Visit (INDEPENDENT_AMBULATORY_CARE_PROVIDER_SITE_OTHER): Payer: BC Managed Care – PPO | Admitting: Gastroenterology

## 2013-12-22 VITALS — BP 130/70 | HR 88 | Ht 64.0 in | Wt 122.1 lb

## 2013-12-22 DIAGNOSIS — K5792 Diverticulitis of intestine, part unspecified, without perforation or abscess without bleeding: Secondary | ICD-10-CM

## 2013-12-22 DIAGNOSIS — K625 Hemorrhage of anus and rectum: Secondary | ICD-10-CM

## 2013-12-22 DIAGNOSIS — K5732 Diverticulitis of large intestine without perforation or abscess without bleeding: Secondary | ICD-10-CM

## 2013-12-22 MED ORDER — NA SULFATE-K SULFATE-MG SULF 17.5-3.13-1.6 GM/177ML PO SOLN: 1.0000 | Freq: Once | ORAL | Status: DC

## 2013-12-22 NOTE — Assessment & Plan Note (Signed)
Clinically resolved the

## 2013-12-22 NOTE — Patient Instructions (Signed)

## 2013-12-22 NOTE — Progress Notes (Signed)
_                                                                                                                History of Present Illness:  Pleasant 63 year old Afro-American female referred at the request of Dr. Ermalinda MemosBradshaw for colonoscopy.  In October, 2014 she was hospitalized with dehydration and acute diverticulitis as determined by CT scan.  At that time she's having some rectal bleeding and collapsed while visiting her hospitalized husband.  The latter was felt to 2 dehydration.  Since discharge she has felt well and has had no further GI complaints including abdominal pain, change in bowel habits or rectal bleeding.  CT scan, which I reviewed, demonstrated thickening of the descending and proximal sigmoid colon, diverticulosis, and some stranding consistent with diverticulitis.    Past Medical History  Diagnosis Date  . Hypertension   . Diabetes mellitus without complication   . Hypercholesteremia   . Anxiety     passing of husband  . Anemia   . GERD (gastroesophageal reflux disease)    Past Surgical History  Procedure Laterality Date  . Partial hysterectomy     family history includes Diabetes in her brother and sister; Heart disease in her brother and father. Current Outpatient Prescriptions  Medication Sig Dispense Refill  . lisinopril-hydrochlorothiazide (PRINZIDE,ZESTORETIC) 20-25 MG per tablet Take 1 tablet by mouth daily.      . metFORMIN (GLUCOPHAGE) 500 MG tablet Take 1 tablet (500 mg total) by mouth daily with breakfast.  90 tablet  3  . pravastatin (PRAVACHOL) 20 MG tablet Take 1 tablet (20 mg total) by mouth daily.  90 tablet  3   No current facility-administered medications for this visit.   Allergies as of 12/22/2013  . (No Known Allergies)    reports that she has never smoked. She quit smokeless tobacco use about 45 years ago. Her smokeless tobacco use included Snuff. She reports that she does not drink alcohol. Her drug history is not on  file.     Review of Systems: Pertinent positive and negative review of systems were noted in the above HPI section. All other review of systems were otherwise negative.  Vital signs were reviewed in today's medical record Physical Exam: General: Well developed , well nourished, no acute distress Skin: anicteric Head: Normocephalic and atraumatic Eyes:  sclerae anicteric, EOMI Ears: Normal auditory acuity Mouth: No deformity or lesions Neck: Supple, no masses or thyromegaly Lungs: Clear throughout to auscultation Heart: Regular rate and rhythm; no murmurs, rubs or bruits Abdomen: Soft, non tender and non distended. No masses, hepatosplenomegaly or hernias noted. Normal Bowel sounds Rectal:deferred Musculoskeletal: Symmetrical with no gross deformities  Skin: No lesions on visible extremities Pulses:  Normal pulses noted Extremities: No clubbing, cyanosis, edema or deformities noted Neurological: Alert oriented x 4, grossly nonfocal Cervical Nodes:  No significant cervical adenopathy Inguinal Nodes: No significant inguinal adenopathy Psychological:  Alert and cooperative. Normal mood and affect  See Assessment and Plan under Problem List

## 2013-12-22 NOTE — Addendum Note (Signed)
Addended by: Marlowe KaysSTALLINGS, Terril Amaro M on: 12/22/2013 09:53 AM   Modules accepted: Orders

## 2013-12-22 NOTE — Assessment & Plan Note (Signed)
She had limited rectal bleeding at the time of acute diverticulitis.  This may have been related to diverticulitis or perhaps hemorrhoidal bleeding.  Recommendations #1 colonoscopy

## 2013-12-26 ENCOUNTER — Encounter: Payer: Self-pay | Admitting: Gastroenterology

## 2014-01-03 ENCOUNTER — Ambulatory Visit (AMBULATORY_SURGERY_CENTER): Payer: BC Managed Care – PPO | Admitting: Gastroenterology

## 2014-01-03 ENCOUNTER — Encounter: Payer: Self-pay | Admitting: Gastroenterology

## 2014-01-03 VITALS — BP 127/62 | HR 68 | Temp 97.2°F | Resp 16 | Ht 64.0 in | Wt 122.0 lb

## 2014-01-03 DIAGNOSIS — K625 Hemorrhage of anus and rectum: Secondary | ICD-10-CM

## 2014-01-03 DIAGNOSIS — K648 Other hemorrhoids: Secondary | ICD-10-CM

## 2014-01-03 DIAGNOSIS — K5730 Diverticulosis of large intestine without perforation or abscess without bleeding: Secondary | ICD-10-CM

## 2014-01-03 DIAGNOSIS — K5732 Diverticulitis of large intestine without perforation or abscess without bleeding: Secondary | ICD-10-CM

## 2014-01-03 LAB — GLUCOSE, CAPILLARY
Glucose-Capillary: 145 mg/dL — ABNORMAL HIGH (ref 70–99)
Glucose-Capillary: 140 mg/dL — ABNORMAL HIGH (ref 70–99)

## 2014-01-03 MED ORDER — SODIUM CHLORIDE 0.9 % IV SOLN: 500.0000 mL | INTRAVENOUS | Status: DC

## 2014-01-03 NOTE — Progress Notes (Signed)
Report to pacu rn, vss, bbs=clear 

## 2014-01-03 NOTE — Patient Instructions (Signed)
YOU HAD AN ENDOSCOPIC PROCEDURE TODAY AT THE [facility]: Refer to the procedure report that was given to you for any specific questions about what was found during the examination.  If the procedure report does not answer your questions, please call your gastroenterologist to clarify.  If you requested that your care partner not be given the details of your procedure findings, then the procedure report has been included in a sealed envelope for you to review at your convenience later.  YOU SHOULD EXPECT: Some feelings of bloating in the abdomen. Passage of more gas than usual.  Walking can help get rid of the air that was put into your GI tract during the procedure and reduce the bloating. If you had a lower endoscopy (such as a colonoscopy or flexible sigmoidoscopy) you may notice spotting of blood in your stool or on the toilet paper. If you underwent a bowel prep for your procedure, then you may not have a normal bowel movement for a few days.  DIET: Your first meal following the procedure should be a light meal and then it is ok to progress to your normal diet.  A half-sandwich or bowl of soup is an example of a good first meal.  Heavy or fried foods are harder to digest and may make you feel nauseous or bloated.  Likewise meals heavy in dairy and vegetables can cause extra gas to form and this can also increase the bloating.  Drink plenty of fluids but you should avoid alcoholic beverages for 24 hours.  ACTIVITY: Your care partner should take you home directly after the procedure.  You should plan to take it easy, moving slowly for the rest of the day.  You can resume normal activity the day after the procedure however you should NOT DRIVE or use heavy machinery for 24 hours (because of the sedation medicines used during the test).    SYMPTOMS TO REPORT IMMEDIATELY: A gastroenterologist can be reached at any hour.  During normal business hours, 8:30 AM to 5:00 PM Monday through Friday,  call [phone].  After hours and on weekends, please call the GI answering service at [phone] who will take a message and have the physician on call contact you.   Following lower endoscopy (colonoscopy or flexible sigmoidoscopy):  Excessive amounts of blood in the stool  Significant tenderness or worsening of abdominal pains  Swelling of the abdomen that is new, acute  Fever of 100F or higher  FOLLOW UP: If any biopsies were taken you will be contacted by phone or by letter within the next 1-3 weeks.  Call your gastroenterologist if you have not heard about the biopsies in 3 weeks.  Our staff will call the home number listed on your records the next business day following your procedure to check on you and address any questions or concerns that you may have at that time regarding the information given to you following your procedure. This is a courtesy call and so if there is no answer at the home number and we have not heard from you through the emergency physician on call, we will assume that you have returned to your regular daily activities without incident.  SIGNATURES/CONFIDENTIALITY: You and/or your care partner have signed paperwork which will be entered into your electronic medical record.  These signatures attest to the fact that that the information above on your After Visit Summary has been reviewed and is understood.  Full responsibility of the confidentiality of this   discharge information lies with you and/or your care-partner.  Recommendations Continue current colorectal screening recommendations for "routine risk" patients with a repeat colonoscopy in 10 years.  

## 2014-01-03 NOTE — Op Note (Signed)
[facility] [address] [place], [id number]   COLONOSCOPY PROCEDURE REPORT  PATIENT: Pamela [name], Pamela A.  MR#: [id number] BIRTHDATE: 30-May-1951 , 62  yrs. old GENDER: Female ENDOSCOPIST: Louis Meckelobert D Natascha Edmonds, MD REFERRED BY: PROCEDURE DATE:  01/03/2014 PROCEDURE:   Colonoscopy, diagnostic First Screening Colonoscopy - Avg.  risk and is 50 yrs.  old or older Yes.  Prior Negative Screening - Now for repeat screening. N/A  History of Adenoma - Now for follow-up colonoscopy & has been > or = to 3 yrs.  N/A  Polyps Removed Today? No.  Recommend repeat exam, <10 yrs? No. ASA CLASS:   Class II INDICATIONS:Rectal Bleeding, acute diverticulitis 3 months ago MEDICATIONS: MAC sedation, administered by CRNA and propofol (Diprivan) 250mg  IV  DESCRIPTION OF PROCEDURE:   After the risks benefits and alternatives of the procedure were thoroughly explained, informed consent was obtained.  A digital rectal exam revealed no abnormalities of the rectum.   The     endoscope was introduced through the anus and advanced to the cecum, which was identified by both the appendix and ileocecal valve. No adverse events experienced.   The quality of the prep was excellent using Suprep The instrument was then slowly withdrawn as the colon was fully examined.      COLON FINDINGS: There was moderate diverticulosis noted in the sigmoid colon and descending colon with associated muscular hypertrophy and colonic narrowing.   There was mild scattered diverticulosis noted in the transverse colon and ascending colon. Internal hemorrhoids were found.  Retroflexed views revealed no abnormalities. The time to cecum=3 minutes 36 seconds.  Withdrawal time=7 minutes 49 seconds.  The scope was withdrawn and the procedure completed. COMPLICATIONS: There were no complications.  ENDOSCOPIC IMPRESSION: 1.   There was moderate diverticulosis noted in the sigmoid colon and descending colon 2.   There was mild  diverticulosis noted in the transverse colon and ascending colon 3.   Internal hemorrhoids  RECOMMENDATIONS: Continue current colorectal screening recommendations for "routine risk" patients with a repeat colonoscopy in 10 years.   eSigned:  Louis Meckelobert D Aaliah Jorgenson, MD 01/03/2014 9:40 AM   cc: Kevin FentonSamuel Bradshaw, MD   PATIENT NAME:  Pamela [name], Pamela A. MR#: [id number]

## 2014-01-04 ENCOUNTER — Telehealth: Payer: Self-pay | Admitting: *Deleted

## 2014-01-04 NOTE — Telephone Encounter (Signed)
  Follow up Call-  Call back number 01/03/2014  Post procedure Call Back phone  # [phone]  Permission to leave phone message Yes     Patient questions:  Do you have a fever, pain , or abdominal swelling? no Pain Score  0 *  Have you tolerated food without any problems? yes  Have you been able to return to your normal activities? yes  Do you have any questions about your discharge instructions: Diet   no Medications  no Follow up visit  no  Do you have questions or concerns about your Care? no  Actions: * If pain score is 4 or above: No action needed, pain <4.

## 2014-01-11 ENCOUNTER — Other Ambulatory Visit: Payer: Self-pay | Admitting: Family Medicine

## 2014-01-12 NOTE — Telephone Encounter (Signed)
Per MD, pt told needs OV.  Pt is agreeable.  Pamela [name], Darlyne RussianKristen L, CMA

## 2014-01-12 NOTE — Telephone Encounter (Signed)
Previously discontinued xanax and started trazadone as it was being used for sleep. Will ask her o have appt before I prescribe xanax.   Murtis SinkSam Mcgwire Dasaro, MD [facility] Resident, PGY-2 01/12/2014, 6:55 AM

## 2014-03-03 ENCOUNTER — Other Ambulatory Visit: Payer: Self-pay | Admitting: Family Medicine

## 2014-04-01 ENCOUNTER — Other Ambulatory Visit: Payer: Self-pay | Admitting: Family Medicine

## 2014-04-11 ENCOUNTER — Ambulatory Visit: Payer: BC Managed Care – PPO | Admitting: Emergency Medicine

## 2014-05-02 ENCOUNTER — Other Ambulatory Visit: Payer: Self-pay | Admitting: Family Medicine

## 2014-05-08 ENCOUNTER — Other Ambulatory Visit: Payer: Self-pay

## 2014-05-08 DIAGNOSIS — Z1231 Encounter for screening mammogram for malignant neoplasm of breast: Secondary | ICD-10-CM

## 2014-05-11 ENCOUNTER — Encounter: Payer: Self-pay | Admitting: Family Medicine

## 2014-05-11 ENCOUNTER — Ambulatory Visit (INDEPENDENT_AMBULATORY_CARE_PROVIDER_SITE_OTHER): Payer: BC Managed Care – PPO | Admitting: Family Medicine

## 2014-05-11 VITALS — BP 130/75 | HR 71 | Temp 98.6°F | Ht 64.0 in | Wt 123.0 lb

## 2014-05-11 DIAGNOSIS — I10 Essential (primary) hypertension: Secondary | ICD-10-CM

## 2014-05-11 DIAGNOSIS — E785 Hyperlipidemia, unspecified: Secondary | ICD-10-CM

## 2014-05-11 DIAGNOSIS — F4321 Adjustment disorder with depressed mood: Secondary | ICD-10-CM

## 2014-05-11 DIAGNOSIS — E119 Type 2 diabetes mellitus without complications: Secondary | ICD-10-CM

## 2014-05-11 DIAGNOSIS — G4700 Insomnia, unspecified: Secondary | ICD-10-CM

## 2014-05-11 LAB — POCT GLYCOSYLATED HEMOGLOBIN (HGB A1C): Hemoglobin A1C: 6.1

## 2014-05-11 MED ORDER — LISINOPRIL-HYDROCHLOROTHIAZIDE 20-25 MG PO TABS: 1.0000 | ORAL_TABLET | Freq: Every day | ORAL | Status: DC

## 2014-05-11 MED ORDER — PRAVASTATIN SODIUM 20 MG PO TABS: 20.0000 mg | ORAL_TABLET | Freq: Every day | ORAL | Status: DC

## 2014-05-11 MED ORDER — METFORMIN HCL 500 MG PO TABS: 500.0000 mg | ORAL_TABLET | Freq: Every day | ORAL | Status: DC

## 2014-05-11 NOTE — Assessment & Plan Note (Signed)
On a statin, continue Check lipids today

## 2014-05-11 NOTE — Assessment & Plan Note (Signed)
PHq-9 score 4, Improving, I think doing well.

## 2014-05-11 NOTE — Assessment & Plan Note (Signed)
Well controlled, continue HCTZ and lisinopril

## 2014-05-11 NOTE — Assessment & Plan Note (Signed)
A1C 6.1, well controlled Continue metformin Normal foot exam Follow up 6 months, needs ophtho

## 2014-05-11 NOTE — Assessment & Plan Note (Signed)
Start melationin, 3-6 mg per night Not helped by trazadone, and it wasn't well tolerated

## 2014-05-11 NOTE — Patient Instructions (Signed)
Great to see you today! You are doing a great job with your blood sugar and blood pressure. Keep up the good work! Remember to keep something sweet with you in case you have a low blood sugar. We will check your cholesterol today. For your trouble sleeping, you can try melatonin over the counter 3-5mg  once daily at bedtime. Please call with any concerns. You are doing so well that we will see you in 6 months unless you have any problems!

## 2014-05-11 NOTE — Progress Notes (Signed)
Subjective:     Patient ID: Pamela [name], female   DOB: [birth year], 63 y.o.   MRN: [id number]  HPI Pamela [name] feels well today. She presents for routine checkup of her hypertension and DMII.   DM2 Her home blood sugars normally run 115-150, and she never sees any sugars below 80 or above 200. She says sometimes at night she sweats a bit, but when she takes her blood sugar it is normally about 90 at these times. Otherwise, she denies any hypoglycemic episodes. She knows the symptoms of hypoglycemia and keeps sweets with her just in case.  Grief reaction/insomnia She notes that she is still sad about losing her husband in October, and she has trouble falling asleep about 4 nights per week. She also wakes up at 2am frequently, which is the time she used to administer her husband's nebulizer. She does admit to using television to fall asleep nightly. She feels that her mood is doing pretty well, and she continues to stay involved with her friends and church. PHQ 9 today was 4.   HTN No chest pain, dyspnea, palpitations, edema, or headaches Taking her medications daily  She continues to be abstinent from tobacco and EtOH.   Health maintenance: patient reports eye exam within the last year;  DMII foot exam performed today; colonoscopy last year; mammogram scheduled for 06/12/14.   Review of Systems Denies CP, SOB, weakness.     Objective:   Physical Exam  Gen: NAD, alert, cooperative with exam HEENT: NCAT, EOMI, PERRL CV: RRR, good S1/S2, no murmur Resp: CTABL, no wheezes, non-labored Ext: No edema, warm- foot below Neuro: Alert and oriented, No gross deficits  Diabetic foot exam: dorsalis pedis 2+ bilaterally; sensation intact to touch and pinprick bilaterally. No wounds or sores. Motor strength 5/5 bilaterally.        Plan:     DIABETES MELLITUS, TYPE II, WITHOUT COMPLICATIONS A1C 6.1, well controlled Continue metformin Normal foot exam Follow up 6 months, needs  ophtho  HYPERLIPIDEMIA On a statin, continue Check lipids today  HYPERTENSION, BENIGN SYSTEMIC Well controlled, continue HCTZ and lisinopril   Feeling grief PHq-9 score 4, Improving, I think doing well.   Insomnia Start melationin, 3-6 mg per night Not helped by trazadone, and it wasn't well tolerated

## 2014-05-12 ENCOUNTER — Encounter: Payer: Self-pay | Admitting: Family Medicine

## 2014-05-12 LAB — LIPID PANEL
CHOLESTEROL: 150 mg/dL (ref 0–200)
HDL: 46 mg/dL (ref >39)
LDL Cholesterol: 70 mg/dL (ref 0–99)
TRIGLYCERIDES: 169 mg/dL — AB (ref <150)
Total CHOL/HDL Ratio: 3.3 Ratio
VLDL: 34 mg/dL (ref 0–40)

## 2014-06-12 ENCOUNTER — Ambulatory Visit
Admission: RE | Admit: 2014-06-12 | Discharge: 2014-06-12 | Disposition: A | Discharge status: Home / Self Care | Payer: BC Managed Care – PPO | Source: Ambulatory Visit

## 2014-06-12 ENCOUNTER — Encounter (INDEPENDENT_AMBULATORY_CARE_PROVIDER_SITE_OTHER): Payer: Self-pay

## 2014-06-12 DIAGNOSIS — Z1231 Encounter for screening mammogram for malignant neoplasm of breast: Secondary | ICD-10-CM

## 2014-07-25 ENCOUNTER — Ambulatory Visit (INDEPENDENT_AMBULATORY_CARE_PROVIDER_SITE_OTHER): Payer: BC Managed Care – PPO | Admitting: Family Medicine

## 2014-07-25 ENCOUNTER — Encounter: Payer: Self-pay | Admitting: Family Medicine

## 2014-07-25 VITALS — BP 149/83 | HR 93 | Temp 98.0°F | Wt 122.0 lb

## 2014-07-25 DIAGNOSIS — R51 Headache: Secondary | ICD-10-CM | POA: Diagnosis not present

## 2014-07-25 DIAGNOSIS — R519 Headache, unspecified: Secondary | ICD-10-CM | POA: Insufficient documentation

## 2014-07-25 DIAGNOSIS — I10 Essential (primary) hypertension: Secondary | ICD-10-CM

## 2014-07-25 DIAGNOSIS — G4700 Insomnia, unspecified: Secondary | ICD-10-CM

## 2014-07-25 NOTE — Assessment & Plan Note (Signed)
Mildly elevated today however previously well tolerated. -monitor at subsequent visits -no changes to pharmacotherapy made today

## 2014-07-25 NOTE — Patient Instructions (Signed)
Headaches - likely tension headache due to not sleeping well, may continue Tylenol as needed (can take up to six 500 mg tablets a day)  Not sleeping well - do not watch TV in your bedroom, do not go to bed until your are tired, if you lay in bed for greater than 30 minutes awake leave the bedroom and do something else in your house, I would encourage a short walk (30 minutes daily), if you continue to have difficulty sleeping please follow up with your primary doctor

## 2014-07-25 NOTE — Assessment & Plan Note (Signed)
Patient continues to have insomnia which I believe is contributing to her current headaches.  -sleep hygiene again discussed -encouraged daily exercise

## 2014-07-25 NOTE — Progress Notes (Signed)
   Subjective:    Patient ID: Pamela [name], female    DOB: [birth year], 63 y.o.   MRN: [id number]  HPI 63 y/o female presents for evaluation of headaches.  Headaches - intermittent over the past 2 weeks, left side of forehead, describes as a dull sensations, no associated photo- or phonophobia, no previous migraines, worse at night, occasionally awake her from sleep, no vision changes, no extremity weakness/numbness, relieved with 1-2 ES Tylenol, pain is 7/10 when the worst, has had previous headaches in the past that felt similar, seen by ophthalmology 2 weeks ago, no vision abnormalities, had dilated eye exam at that time  Insomnia - patient reports not sleeping well, she lost her husband a year ago to cancer, she states that her mood has improved from initially after his death, has a support system at church, no recent stressors, she sleeps with the TV on, has central air so the room is cool, will lays awake for long periods of time, does not exercise regularly   Review of Systems  Constitutional: Negative for fever, chills and fatigue.  Respiratory: Negative for shortness of breath.   Cardiovascular: Negative for chest pain.  Gastrointestinal: Negative for nausea, vomiting and diarrhea.  Neurological: Positive for headaches. Negative for tremors, syncope, weakness, light-headedness and numbness.       Objective:   Physical Exam Vitals: reviewed Gen: pleasant AAF, NAD HEENT: normocephalic, PERRL, EOMI, attempted non-dilated fundoscopic exam however unable to visualize fundus bilateral TM's pearly grey, nasal septum midline, MMM, uvula midline, neck supple, no anterior or posterior cervical lymphadenopathy, no frontal or maxillary sinus tenderness Cardiac: RRR, S1 and S2 present, no murmurs, no heaves/thrills Resp: CTAB, normal effort Neuro: CN 2-12 intact, strength 5/5 in all extremities, sensation grossly intact to light touch     Assessment & Plan:  Please see problem specific  assessment and plan.

## 2014-07-25 NOTE — Assessment & Plan Note (Signed)
Patient presents with intermittent headache for 2 weeks. Consistent with tension HA due to insomnia. No red flag neurologic signs/symptoms. -discussed sleep hygiene -continue Tylenol PRN

## 2014-09-11 ENCOUNTER — Ambulatory Visit: Payer: BC Managed Care – PPO

## 2014-09-11 ENCOUNTER — Encounter: Payer: Self-pay | Admitting: Family Medicine

## 2014-09-11 ENCOUNTER — Ambulatory Visit (INDEPENDENT_AMBULATORY_CARE_PROVIDER_SITE_OTHER): Payer: BC Managed Care – PPO | Admitting: Family Medicine

## 2014-09-11 VITALS — BP 118/71 | HR 78 | Temp 98.1°F | Ht 64.0 in | Wt 125.0 lb

## 2014-09-11 DIAGNOSIS — Z23 Encounter for immunization: Secondary | ICD-10-CM

## 2014-09-11 DIAGNOSIS — J020 Streptococcal pharyngitis: Secondary | ICD-10-CM | POA: Diagnosis not present

## 2014-09-11 DIAGNOSIS — J029 Acute pharyngitis, unspecified: Secondary | ICD-10-CM

## 2014-09-11 LAB — POCT RAPID STREP A (OFFICE): Rapid Strep A Screen: NEGATIVE

## 2014-09-11 NOTE — Progress Notes (Signed)
Patient ID: Pamela [name], female   DOB: [birth year], 63 y.o.   MRN: [id number]  HPI:  Pt presents for a same day appointment to discuss sore throat.  Has had sore throat x 3 days. Also some cough. No nasal congestion. Daughter recently had diagnosed strep throat and then came to stay with her. No ear pain. No fevers.  ROS: See HPI  PMFSH: hx T2DM, HLD, HTN  PHYSICAL EXAM: BP 118/71  Pulse 78  Temp(Src) 98.1 F (36.7 C) (Oral)  Ht 5\' 4"  (1.626 m)  Wt 125 lb (56.7 kg)  BMI 21.45 kg/m2 Gen: NAD HEENT: NCAT, oropharynx erythematous with slightly enlarged tonsils but no visible exudate. Shoddy anterior cervical lymphadenopathy. TM's clear bilat. Nares patent. Heart: RRR Lungs: CTAB, NWOB Neuro: grossly nonfocal, speech normal  ASSESSMENT/PLAN:  1. Sore throat - suspect viral URI. Rapid strep negative here in office today. Recommend supportive care with PO fluid intake, sore throat lozenges, tylenol as needed. Follow up if not improving within next few days.  FOLLOW UP: F/u as needed if symptoms worsen or do not improve.   GrenadaBrittany J. Pollie MeyerMcIntyre, MD [facility]

## 2014-09-11 NOTE — Patient Instructions (Signed)
Your strep test was negative Drink plenty of fluids Sore throat lozenges Tylenol as needed for pain Return if not feeling better in a few days  Be well, Dr. Pollie MeyerMcIntyre    Upper Respiratory Infection, Adult An upper respiratory infection (URI) is also known as the common cold. It is often caused by a type of germ (virus). Colds are easily spread (contagious). You can pass it to others by kissing, coughing, sneezing, or drinking out of the same glass. Usually, you get better in 1 or 2 weeks.  HOME CARE   Only take medicine as told by your doctor.  Use a warm mist humidifier or breathe in steam from a hot shower.  Drink enough water and fluids to keep your pee (urine) clear or pale yellow.  Get plenty of rest.  Return to work when your temperature is back to normal or as told by your doctor. You may use a face mask and wash your hands to stop your cold from spreading. GET HELP RIGHT AWAY IF:   After the first few days, you feel you are getting worse.  You have questions about your medicine.  You have chills, shortness of breath, or B[place]ron or red spit (mucus).  You have yellow or Simeone snot (nasal discharge) or pain in the face, especially when you bend forward.  You have a fever, puffy (swollen) neck, pain when you swallow, or white spots in the back of your throat.  You have a bad headache, ear pain, sinus pain, or chest pain.  You have a high-pitched whistling sound when you breathe in and out (wheezing).  You have a lasting cough or cough up blood.  You have sore muscles or a stiff neck. MAKE SURE YOU:   Understand these instructions.  Will watch your condition.  Will get help right away if you are not doing well or get worse. Document Released: 05/12/2008 Document Revised: 02/16/2012 Document Reviewed: 03/01/2014 [facility] Patient Information 2015 [employer] [place]. This information is not intended to replace advice given to you by your health care provider. Make sure you  discuss any questions you have with your health care provider.

## 2015-01-15 ENCOUNTER — Ambulatory Visit (INDEPENDENT_AMBULATORY_CARE_PROVIDER_SITE_OTHER): Payer: 59 | Admitting: Family Medicine

## 2015-01-15 ENCOUNTER — Encounter: Payer: Self-pay | Admitting: Family Medicine

## 2015-01-15 VITALS — BP 122/70 | HR 101 | Temp 97.6°F | Ht 64.0 in | Wt 123.0 lb

## 2015-01-15 DIAGNOSIS — E119 Type 2 diabetes mellitus without complications: Secondary | ICD-10-CM

## 2015-01-15 DIAGNOSIS — I10 Essential (primary) hypertension: Secondary | ICD-10-CM

## 2015-01-15 DIAGNOSIS — K219 Gastro-esophageal reflux disease without esophagitis: Secondary | ICD-10-CM

## 2015-01-15 DIAGNOSIS — G4700 Insomnia, unspecified: Secondary | ICD-10-CM

## 2015-01-15 LAB — POCT GLYCOSYLATED HEMOGLOBIN (HGB A1C): HEMOGLOBIN A1C: 6.1

## 2015-01-15 MED ORDER — PANTOPRAZOLE SODIUM 40 MG PO TBEC: 40.0000 mg | DELAYED_RELEASE_TABLET | Freq: Every day | ORAL | Status: DC

## 2015-01-15 NOTE — Patient Instructions (Signed)
Great to see you!  Great job with diabetes, try to watch your diet and work in something you can call exercise 3 times a week.   Try protonix (pantoprazole) 1 pill daily, 30 minutes before dinner\  Lets see you again in 6 weeks  Diet Recommendations for Diabetes   Starchy (carb) foods include: Bread, rice, pasta, potatoes, corn, crackers, bagels, muffins, all baked goods.   Protein foods include: Meat, fish, poultry, eggs, dairy foods, and beans such as pinto and kidney beans (beans also provide carbohydrate).   1. Eat at least 3 meals and 1-2 snacks per day. Never go more than 4-5 hours while awake without eating.  2. Limit starchy foods to TWO per meal and ONE per snack. ONE portion of a starchy  food is equal to the following:   - ONE slice of bread (or its equivalent, such as half of a hamburger bun).   - 1/2 cup of a "scoopable" starchy food such as potatoes or rice.   - 1 OUNCE (28 grams) of starchy snack foods such as crackers or pretzels (look on label).   - 15 grams of carbohydrate as shown on food label.  3. Both lunch and dinner should include a protein food, a carb food, and vegetables.   - Obtain twice as many veg's as protein or carbohydrate foods for both lunch and dinner.   - Try to keep frozen veg's on hand for a quick vegetable serving.     - Fresh or frozen veg's are best.  4. Breakfast should always include protein.

## 2015-01-15 NOTE — Assessment & Plan Note (Signed)
Possibly mood related Previous severe grief reaction with loss of her husband Loss of sleep is still around the time that she was gotten out of bed to help her husband Discussed melatonin Follow-up with formal mood evaluation

## 2015-01-15 NOTE — Progress Notes (Signed)
Patient ID: Pamela [name], female   DOB: 1951/06/28, 64 y.o.   MRN: [id number]   HPI  Patient presents today for follow-up diabetes, new concern of GERD  GERD Problems over the last 2-3 weeks, states that she has persistent problems which worried her. She's had some improvement with Zantac and times that feels like symptoms are not completely controlled. Also there some question of dysphagia and globus sensation She denies fever, chills, weight loss  Diabetes Taking metformin daily Not watching her diet Active but not formally exercising  Insomnia Patient explains that she still continues to have a difficult time sleeping. She does not want to take any additional medications that are prescribed and ask for over-the-counter help. We discussed melatonin She states that she is getting up around 1 AM every morning which is the time that she previously would've gotten up to help her husband with medications.   Smoking status noted ROS: Per HPI  Objective: BP 122/70 mmHg  Pulse 101  Temp(Src) 97.6 F (36.4 C) (Oral)  Ht 5\' 4"  (1.626 m)  Wt 123 lb (55.792 kg)  BMI 21.10 kg/m2 Gen: NAD, alert, cooperative with exam HEENT: NCAT, oropharynx clear CV: RRR, good S1/S2, no murmur Resp: CTABL, no wheezes, non-labored Neuro: Alert and oriented, No gross deficits  Assessment and plan:  Controlled diabetes mellitus type II without complication A1C well-controlled today, 6.1 Continue metformin at current dose Provided diabetic diet guidance   GERD (gastroesophageal reflux disease) New-onset heartburn over the last 2 weeks, taking Zantac frequently, some question of dysphagia Trial of PPI, follow up 6 weeks   HYPERTENSION, BENIGN SYSTEMIC Well-controlled, initially elevated but normalized on recheck Continue Prinzide   Insomnia Possibly mood related Previous severe grief reaction with loss of her husband Loss of sleep is still around the time that she was gotten out of bed to  help her husband Discussed melatonin Follow-up with formal mood evaluation     Orders Placed This Encounter  Procedures  . POCT HgB A1C    Meds ordered this encounter  Medications  . pantoprazole (PROTONIX) 40 MG tablet    Sig: Take 1 tablet (40 mg total) by mouth daily.    Dispense:  30 tablet    Refill:  3

## 2015-01-15 NOTE — Assessment & Plan Note (Signed)
A1C well-controlled today, 6.1 Continue metformin at current dose Provided diabetic diet guidance

## 2015-01-15 NOTE — Assessment & Plan Note (Signed)
New-onset heartburn over the last 2 weeks, taking Zantac frequently, some question of dysphagia Trial of PPI, follow up 6 weeks

## 2015-01-15 NOTE — Assessment & Plan Note (Signed)
Well-controlled, initially elevated but normalized on recheck Continue Prinzide

## 2015-02-12 ENCOUNTER — Telehealth: Payer: Self-pay | Admitting: *Deleted

## 2015-02-12 MED ORDER — GLUCOSE BLOOD VI STRP: ORAL_STRIP | Status: DC

## 2015-02-12 NOTE — Telephone Encounter (Signed)
Sent Rx, will ask nursing to inform.   Murtis SinkSam Bradshaw, MD [facility] Resident, PGY-3 02/12/2015, 12:16 PM

## 2015-02-12 NOTE — Telephone Encounter (Signed)
Refill request for truetest glucose test strips

## 2015-02-20 ENCOUNTER — Ambulatory Visit: Payer: 59 | Admitting: Family Medicine

## 2015-04-01 ENCOUNTER — Emergency Department (HOSPITAL_COMMUNITY)
Admission: EM | Admit: 2015-04-01 | Discharge: 2015-04-01 | Disposition: A | Discharge status: Home / Self Care | Payer: 59 | Attending: Emergency Medicine | Admitting: Emergency Medicine

## 2015-04-01 ENCOUNTER — Encounter (HOSPITAL_COMMUNITY): Payer: Self-pay | Admitting: Emergency Medicine

## 2015-04-01 DIAGNOSIS — I10 Essential (primary) hypertension: Secondary | ICD-10-CM | POA: Insufficient documentation

## 2015-04-01 DIAGNOSIS — R112 Nausea with vomiting, unspecified: Secondary | ICD-10-CM | POA: Diagnosis not present

## 2015-04-01 DIAGNOSIS — K219 Gastro-esophageal reflux disease without esophagitis: Secondary | ICD-10-CM | POA: Diagnosis not present

## 2015-04-01 DIAGNOSIS — E780 Pure hypercholesterolemia: Secondary | ICD-10-CM | POA: Insufficient documentation

## 2015-04-01 DIAGNOSIS — Z862 Personal history of diseases of the blood and blood-forming organs and certain disorders involving the immune mechanism: Secondary | ICD-10-CM | POA: Insufficient documentation

## 2015-04-01 DIAGNOSIS — Z8659 Personal history of other mental and behavioral disorders: Secondary | ICD-10-CM | POA: Insufficient documentation

## 2015-04-01 DIAGNOSIS — R197 Diarrhea, unspecified: Secondary | ICD-10-CM | POA: Insufficient documentation

## 2015-04-01 DIAGNOSIS — E11649 Type 2 diabetes mellitus with hypoglycemia without coma: Secondary | ICD-10-CM | POA: Diagnosis present

## 2015-04-01 DIAGNOSIS — Z79899 Other long term (current) drug therapy: Secondary | ICD-10-CM | POA: Diagnosis not present

## 2015-04-01 DIAGNOSIS — R1110 Vomiting, unspecified: Secondary | ICD-10-CM

## 2015-04-01 LAB — URINALYSIS, ROUTINE W REFLEX MICROSCOPIC
Bilirubin Urine: NEGATIVE
GLUCOSE, UA: NEGATIVE mg/dL
HGB URINE DIPSTICK: NEGATIVE
Ketones, ur: NEGATIVE mg/dL
LEUKOCYTES UA: NEGATIVE
Nitrite: NEGATIVE
PROTEIN: NEGATIVE mg/dL
Specific Gravity, Urine: 1.025 (ref 1.005–1.030)
Urobilinogen, UA: 0.2 mg/dL (ref 0.0–1.0)
pH: 5 (ref 5.0–8.0)

## 2015-04-01 LAB — CBC
HCT: 35.2 % — ABNORMAL LOW (ref 36.0–46.0)
HEMOGLOBIN: 11.4 g/dL — AB (ref 12.0–15.0)
MCH: 31.7 pg (ref 26.0–34.0)
MCHC: 32.4 g/dL (ref 30.0–36.0)
MCV: 97.8 fL (ref 78.0–100.0)
Platelets: 300 10*3/uL (ref 150–400)
RBC: 3.6 MIL/uL — ABNORMAL LOW (ref 3.87–5.11)
RDW: 13 % (ref 11.5–15.5)
WBC: 9.3 10*3/uL (ref 4.0–10.5)

## 2015-04-01 LAB — COMPREHENSIVE METABOLIC PANEL
ALK PHOS: 81 U/L (ref 39–117)
ALT: 11 U/L (ref 0–35)
AST: 17 U/L (ref 0–37)
Albumin: 4.5 g/dL (ref 3.5–5.2)
Anion gap: 8 (ref 5–15)
BILIRUBIN TOTAL: 0.5 mg/dL (ref 0.3–1.2)
BUN: 15 mg/dL (ref 6–23)
CALCIUM: 9.4 mg/dL (ref 8.4–10.5)
CO2: 29 mmol/L (ref 19–32)
Chloride: 102 mmol/L (ref 96–112)
Creatinine, Ser: 0.97 mg/dL (ref 0.50–1.10)
GFR calc Af Amer: 71 mL/min — ABNORMAL LOW (ref >90)
GFR, EST NON AFRICAN AMERICAN: 61 mL/min — AB (ref >90)
Glucose, Bld: 199 mg/dL — ABNORMAL HIGH (ref 70–99)
POTASSIUM: 3.5 mmol/L (ref 3.5–5.1)
Sodium: 139 mmol/L (ref 135–145)
Total Protein: 8.1 g/dL (ref 6.0–8.3)

## 2015-04-01 LAB — CBG MONITORING, ED
Glucose-Capillary: 143 mg/dL — ABNORMAL HIGH (ref 70–99)
Glucose-Capillary: 143 mg/dL — ABNORMAL HIGH (ref 70–99)

## 2015-04-01 MED ORDER — ONDANSETRON 8 MG PO TBDP
8.0000 mg | ORAL_TABLET | Freq: Once | ORAL | Status: AC
  Administered 2015-04-01: 8 mg via ORAL
  Filled 2015-04-01: qty 1

## 2015-04-01 MED ORDER — ONDANSETRON 8 MG PO TBDP
ORAL_TABLET | ORAL | Status: DC
Start: 2015-04-01 — End: 2015-05-28

## 2015-04-01 NOTE — ED Provider Notes (Signed)
CSN: [id number]     Arrival date & time 04/01/15  0307 History   First MD Initiated Contact with Patient 04/01/15 0544     Chief Complaint  Patient presents with  . Hypoglycemia     (Consider location/radiation/quality/duration/timing/severity/associated sxs/prior Treatment) Patient is a 64 y.o. female presenting with vomiting. The history is provided by the patient.  Emesis Severity:  Mild Timing:  Sporadic Quality:  Stomach contents Progression:  Unchanged Chronicity:  New Recent urination:  Normal Context: not post-tussive   Relieved by:  Nothing Worsened by:  Nothing tried Ineffective treatments:  None tried Associated symptoms: no abdominal pain, no fever and no sore throat   Associated symptoms comment:  Loose stool Risk factors: no alcohol use   Sugar was low drank some OJ and came in because her sugar is not normally that low  Past Medical History  Diagnosis Date  . Hypertension   . Diabetes mellitus without complication   . Hypercholesteremia   . Anxiety     passing of husband  . Anemia   . GERD (gastroesophageal reflux disease)    Past Surgical History  Procedure Laterality Date  . Partial hysterectomy     Family History  Problem Relation Age of Onset  . Diabetes Sister   . Diabetes Brother   . Heart disease Brother   . Heart disease Father    History  Substance Use Topics  . Smoking status: Never Smoker   . Smokeless tobacco: Former NeurosurgeonUser    Types: Snuff    Quit date: 12/08/1968  . Alcohol Use: No   OB History    No data available     Review of Systems  HENT: Negative for sore throat.   Respiratory: Negative for shortness of breath.   Cardiovascular: Negative for chest pain, palpitations and leg swelling.  Gastrointestinal: Positive for nausea and vomiting. Negative for abdominal pain and blood in stool.  All other systems reviewed and are negative.     Allergies  Review of patient's allergies indicates no known allergies.  Home  Medications   Prior to Admission medications   Medication Sig Start Date End Date Taking? Authorizing Provider  lisinopril-hydrochlorothiazide (PRINZIDE,ZESTORETIC) 20-25 MG per tablet Take 1 tablet by mouth daily. 05/11/14  Yes Elenora GammaSamuel L Bradshaw, MD  metFORMIN (GLUCOPHAGE) 500 MG tablet Take 1 tablet (500 mg total) by mouth daily with breakfast. 05/11/14  Yes Elenora GammaSamuel L Bradshaw, MD  pantoprazole (PROTONIX) 40 MG tablet Take 1 tablet (40 mg total) by mouth daily. 01/15/15  Yes Elenora GammaSamuel L Bradshaw, MD  pravastatin (PRAVACHOL) 20 MG tablet Take 1 tablet (20 mg total) by mouth daily. 05/11/14  Yes Elenora GammaSamuel L Bradshaw, MD  glucose blood test strip Check blood sugar once daily fasting 02/12/15   Elenora GammaSamuel L Bradshaw, MD   BP 159/71 mmHg  Pulse 91  Temp(Src) 97.7 F (36.5 C) (Oral)  Resp 15  SpO2 100% Physical Exam  Constitutional: She is oriented to person, place, and time. She appears well-developed and well-nourished. No distress.  HENT:  Head: Normocephalic and atraumatic.  Mouth/Throat: Oropharynx is clear and moist.  Eyes: Conjunctivae are normal. Pupils are equal, round, and reactive to light.  Neck: Normal range of motion. Neck supple.  Cardiovascular: Normal rate and regular rhythm.   Pulmonary/Chest: Effort normal and breath sounds normal. No respiratory distress. She has no wheezes. She has no rales.  Abdominal: Soft. There is no tenderness. There is no rebound, no guarding, no tenderness at McBurney's point and negative Murphy's  sign.  Musculoskeletal: Normal range of motion.  Neurological: She is alert and oriented to person, place, and time.  Skin: Skin is warm and dry.  Psychiatric: She has a normal mood and affect.    ED Course  Procedures (including critical care time) Labs Review Labs Reviewed  COMPREHENSIVE METABOLIC PANEL - Abnormal; Notable for the following:    Glucose, Bld 199 (*)    GFR calc non Af Amer 61 (*)    GFR calc Af Amer 71 (*)    All other components within normal  limits  CBC - Abnormal; Notable for the following:    RBC 3.60 (*)    Hemoglobin 11.4 (*)    HCT 35.2 (*)    All other components within normal limits  CBG MONITORING, ED - Abnormal; Notable for the following:    Glucose-Capillary 143 (*)    All other components within normal limits  URINALYSIS, ROUTINE W REFLEX MICROSCOPIC  CBG MONITORING, ED  CBG MONITORING, ED    Imaging Review No results found.   EKG Interpretation None      MDM   Final diagnoses:  None    Zofran ODT, PO challenge and discharge with close follow up.  Suspect diarrhea with GI illness.     Cy Blamer, MD 04/01/15 [id number]

## 2015-04-01 NOTE — ED Notes (Signed)
Pt reports lightheadedness, nausea, vomiting starting around 130am this morning. Pt reports her blood sugar was lower than normal. Pt given orange juice at home and blood sugar went up to 157 but was unable to keep food or orange juice down.

## 2015-04-01 NOTE — ED Notes (Signed)
Bed: WA04 Expected date:  Expected time:  Means of arrival:  Comments: 

## 2015-05-10 ENCOUNTER — Encounter: Payer: Self-pay | Admitting: Family Medicine

## 2015-05-10 ENCOUNTER — Ambulatory Visit (INDEPENDENT_AMBULATORY_CARE_PROVIDER_SITE_OTHER): Payer: 59 | Admitting: Family Medicine

## 2015-05-10 VITALS — BP 143/56 | HR 74 | Temp 98.3°F | Ht 64.0 in | Wt 119.3 lb

## 2015-05-10 DIAGNOSIS — E119 Type 2 diabetes mellitus without complications: Secondary | ICD-10-CM | POA: Diagnosis not present

## 2015-05-10 DIAGNOSIS — Z7189 Other specified counseling: Secondary | ICD-10-CM | POA: Diagnosis not present

## 2015-05-10 DIAGNOSIS — N289 Disorder of kidney and ureter, unspecified: Secondary | ICD-10-CM

## 2015-05-10 DIAGNOSIS — K5731 Diverticulosis of large intestine without perforation or abscess with bleeding: Secondary | ICD-10-CM | POA: Diagnosis not present

## 2015-05-10 DIAGNOSIS — E785 Hyperlipidemia, unspecified: Secondary | ICD-10-CM

## 2015-05-10 DIAGNOSIS — K219 Gastro-esophageal reflux disease without esophagitis: Secondary | ICD-10-CM

## 2015-05-10 DIAGNOSIS — D62 Acute posthemorrhagic anemia: Secondary | ICD-10-CM | POA: Diagnosis not present

## 2015-05-10 DIAGNOSIS — R002 Palpitations: Secondary | ICD-10-CM

## 2015-05-10 DIAGNOSIS — K5790 Diverticulosis of intestine, part unspecified, without perforation or abscess without bleeding: Secondary | ICD-10-CM | POA: Insufficient documentation

## 2015-05-10 LAB — POCT GLYCOSYLATED HEMOGLOBIN (HGB A1C): Hemoglobin A1C: 6.1

## 2015-05-10 NOTE — Assessment & Plan Note (Signed)
Doing well with Protonix, continue Feeling of food not clearing but only with Beef, wathcful waiting

## 2015-05-10 NOTE — Assessment & Plan Note (Signed)
Mostly asymptomatic but persistnet now for several months Cardiology referral

## 2015-05-10 NOTE — Assessment & Plan Note (Signed)
Arrange fasting labs, recent LFTs WNL Continue statin for now

## 2015-05-10 NOTE — Assessment & Plan Note (Signed)
Would likely benefit from Ace Urine microalbumin today Labs, consider ace on f/u

## 2015-05-10 NOTE — Assessment & Plan Note (Signed)
A1c stable Continue metformin Diabetes nutrition class About a diet information

## 2015-05-10 NOTE — Assessment & Plan Note (Signed)
Needs pap- recommended Diabetic care

## 2015-05-10 NOTE — Assessment & Plan Note (Signed)
Last CBC with Hgb 11.2, repeat with fasting labs

## 2015-05-10 NOTE — Patient Instructions (Signed)
Great to see you!  You should schedule a Pap smear PLease have your eye doctor send us records Please make an appointment for your labs for sometime next week up front.  Diet Recommendations for Diabetes   Starchy (carb) foods include: Bread, rice, pasta, potatoes, corn, crackers, bagels, muffins, all baked goods.   Protein foods include: Meat, fish, poultry, eggs, dairy foods, and beans such as pinto and kidney beans (beans also provide carbohydrate).   1. Eat at least 3 meals and 1-2 snacks per day. Never go more than 4-5 hours while awake without eating.  2. Limit starchy foods to TWO per meal and ONE per snack. ONE portion of a starchy  food is equal to the following:   - ONE slice of bread (or its equivalent, such as half of a hamburger bun).   - 1/2 cup of a "scoopable" starchy food such as potatoes or rice.   - 1 OUNCE (28 grams) of starchy snack foods such as crackers or pretzels (look on label).   - 15 grams of carbohydrate as shown on food label.  3. Both lunch and dinner should include a protein food, a carb food, and vegetables.   - Obtain twice as many veg's as protein or carbohydrate foods for both lunch and dinner.   - Try to keep frozen veg's on hand for a quick vegetable serving.     - Fresh or frozen veg's are best.  4. Breakfast should always include protein.

## 2015-05-10 NOTE — Progress Notes (Addendum)
Patient ID: Pamela [name], female   DOB: Apr 05, 1951, 64 y.o.   MRN: [id number]   HPI  Patient presents today for follow-up  Diabetes Tolerating metformin well Fasting blood sugar averages 140-150, postprandial average is 150-175  Palpitations Patient states over the last several months she's had intermittent palpitations 1-2 times per month. She did have an episode last month where she went to the hospital with emesis and palpitations. However at that time it was attributed to viral infection. The most of her episodes she denies dizziness, weakness, dyspnea, or chest pain. She denies chest pain or dyspnea in general.  GERD-doing well with Protonix, Atrophy or food not clearing her esophagus occasionally. She states this only happens with beef.  Screened for mood disorder using PHQ-9 (2), GAD-7(1)and MDQ (3, no, no problem)- Scores in parentheses - all negative  Smoking status noted - never ROS: Per HPI  Objective: BP 143/56 mmHg  Pulse 74  Temp(Src) 98.3 F (36.8 C) (Oral)  Ht 5\' 4"  (1.626 m)  Wt 119 lb 4.8 oz (54.114 kg)  BMI 20.47 kg/m2 Gen: NAD, alert, cooperative with exam HEENT: NCAT CV: RRR, good S1/S2, no murmur Resp: CTABL, no wheezes, non-labored Ext: No edema, warm Neuro: Alert and oriented, No gross deficits  Diabetic foot 2+ DP pulses, sensation intact monofilament throughout, no lesions  Assessment and plan:  Acute blood loss anemia Last CBC with Hgb 11.2, repeat with fasting labs   Controlled diabetes mellitus type II without complication A1c stable Continue metformin Diabetes nutrition class About a diet information   Counseling on health promotion and disease prevention Needs pap- recommended Diabetic care   GERD (gastroesophageal reflux disease) Doing well with Protonix, continue Feeling of food not clearing but only with Beef, wathcful waiting   HLD (hyperlipidemia) Arrange fasting labs, recent LFTs WNL Continue statin for  now   Renal insufficiency, mild Would likely benefit from Ace Urine microalbumin today Labs, consider ace on f/u    Orders Placed This Encounter  Procedures  . CBC with Differential    Standing Status: Future     Number of Occurrences:      Standing Expiration Date: 05/09/2016  . Comprehensive metabolic panel    Standing Status: Future     Number of Occurrences:      Standing Expiration Date: 05/09/2016  . Lipid Panel    Standing Status: Future     Number of Occurrences:      Standing Expiration Date: 05/09/2016  . Microalbumin, urine  . Ambulatory referral to diabetic education    Referral Priority:  Routine    Referral Type:  Consultation    Referral Reason:  Specialty Services Required    Number of Visits Requested:  1  . HgB A1c    No orders of the defined types were placed in this encounter.

## 2015-05-11 ENCOUNTER — Encounter: Payer: Self-pay | Admitting: Family Medicine

## 2015-05-11 ENCOUNTER — Other Ambulatory Visit: Payer: Self-pay | Admitting: *Deleted

## 2015-05-11 DIAGNOSIS — K219 Gastro-esophageal reflux disease without esophagitis: Secondary | ICD-10-CM

## 2015-05-11 LAB — MICROALBUMIN, URINE: MICROALB UR: 0.4 mg/dL (ref <2.0)

## 2015-05-11 MED ORDER — PANTOPRAZOLE SODIUM 40 MG PO TBEC: 40.0000 mg | DELAYED_RELEASE_TABLET | Freq: Every day | ORAL | Status: DC

## 2015-05-17 ENCOUNTER — Telehealth: Payer: Self-pay | Admitting: Family Medicine

## 2015-05-17 NOTE — Telephone Encounter (Signed)
6.9.16 attempt to call pt 3 times to sched but pt not home.Marland KitchenMarland KitchenSpecialty Services Required/Palpitations/ref Dr Suanne Marker per referral WQ.Pt never called back to schedule...removing from WQ.Marland Kitchendwm  Thanks [employer] [facility]

## 2015-05-17 NOTE — Telephone Encounter (Signed)
Called and explained to patient, was able to reach her at [phone]. I gave her [facility] number and asked her to call for an appt.   She states she will.   Murtis Sink, MD [facility] Resident, PGY-3 05/17/2015, 11:47 AM

## 2015-05-21 ENCOUNTER — Other Ambulatory Visit: Payer: Self-pay

## 2015-05-21 DIAGNOSIS — Z1231 Encounter for screening mammogram for malignant neoplasm of breast: Secondary | ICD-10-CM

## 2015-05-25 ENCOUNTER — Encounter (HOSPITAL_COMMUNITY): Payer: Self-pay

## 2015-05-25 ENCOUNTER — Emergency Department (HOSPITAL_COMMUNITY)
Admission: EM | Admit: 2015-05-25 | Discharge: 2015-05-25 | Disposition: A | Discharge status: Home / Self Care | Payer: 59 | Attending: Emergency Medicine | Admitting: Emergency Medicine

## 2015-05-25 ENCOUNTER — Emergency Department (HOSPITAL_COMMUNITY): Payer: 59

## 2015-05-25 DIAGNOSIS — Z8659 Personal history of other mental and behavioral disorders: Secondary | ICD-10-CM | POA: Insufficient documentation

## 2015-05-25 DIAGNOSIS — S20219A Contusion of unspecified front wall of thorax, initial encounter: Secondary | ICD-10-CM | POA: Diagnosis not present

## 2015-05-25 DIAGNOSIS — K219 Gastro-esophageal reflux disease without esophagitis: Secondary | ICD-10-CM | POA: Insufficient documentation

## 2015-05-25 DIAGNOSIS — S199XXA Unspecified injury of neck, initial encounter: Secondary | ICD-10-CM | POA: Diagnosis present

## 2015-05-25 DIAGNOSIS — Y999 Unspecified external cause status: Secondary | ICD-10-CM | POA: Diagnosis not present

## 2015-05-25 DIAGNOSIS — S139XXA Sprain of joints and ligaments of unspecified parts of neck, initial encounter: Secondary | ICD-10-CM

## 2015-05-25 DIAGNOSIS — E780 Pure hypercholesterolemia: Secondary | ICD-10-CM | POA: Insufficient documentation

## 2015-05-25 DIAGNOSIS — Z862 Personal history of diseases of the blood and blood-forming organs and certain disorders involving the immune mechanism: Secondary | ICD-10-CM | POA: Insufficient documentation

## 2015-05-25 DIAGNOSIS — E119 Type 2 diabetes mellitus without complications: Secondary | ICD-10-CM | POA: Diagnosis not present

## 2015-05-25 DIAGNOSIS — I10 Essential (primary) hypertension: Secondary | ICD-10-CM | POA: Diagnosis not present

## 2015-05-25 DIAGNOSIS — Y92410 Unspecified street and highway as the place of occurrence of the external cause: Secondary | ICD-10-CM | POA: Insufficient documentation

## 2015-05-25 DIAGNOSIS — S134XXA Sprain of ligaments of cervical spine, initial encounter: Secondary | ICD-10-CM | POA: Diagnosis not present

## 2015-05-25 DIAGNOSIS — Z79899 Other long term (current) drug therapy: Secondary | ICD-10-CM | POA: Insufficient documentation

## 2015-05-25 DIAGNOSIS — Y939 Activity, unspecified: Secondary | ICD-10-CM | POA: Diagnosis not present

## 2015-05-25 MED ORDER — IBUPROFEN 800 MG PO TABS: 800.0000 mg | ORAL_TABLET | Freq: Three times a day (TID) | ORAL | Status: DC

## 2015-05-25 NOTE — ED Notes (Signed)
Pt alert and oriented x4. Respirations even and unlabored, bilateral symmetrical rise and fall of chest. Skin warm and dry. In no acute distress. Denies needs.   
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
## 2015-05-25 NOTE — ED Notes (Signed)
Pt escorted to discharge window. Pt verbalized understanding discharge instructions. In no acute distress.  
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
## 2015-05-28 ENCOUNTER — Emergency Department (INDEPENDENT_AMBULATORY_CARE_PROVIDER_SITE_OTHER)
Admission: EM | Admit: 2015-05-28 | Discharge: 2015-05-28 | Disposition: A | Discharge status: Home / Self Care | Payer: Self-pay | Source: Home / Self Care | Attending: Family Medicine | Admitting: Family Medicine

## 2015-05-28 ENCOUNTER — Emergency Department (INDEPENDENT_AMBULATORY_CARE_PROVIDER_SITE_OTHER): Payer: 59

## 2015-05-28 ENCOUNTER — Encounter (HOSPITAL_COMMUNITY): Payer: Self-pay | Admitting: Emergency Medicine

## 2015-05-28 DIAGNOSIS — S20212A Contusion of left front wall of thorax, initial encounter: Secondary | ICD-10-CM

## 2015-05-28 MED ORDER — TRAMADOL HCL 50 MG PO TABS: 50.0000 mg | ORAL_TABLET | Freq: Four times a day (QID) | ORAL | Status: DC | PRN
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
## 2015-05-28 NOTE — Discharge Instructions (Signed)
Thank you for coming in today. Call or go to the emergency room if you get worse, have trouble breathing, have chest pains, or palpitations.  Use the incentive spirometer every hour while awake.  Take tramadol for severe pain. Do not take and drive.  Follow up with your doctor.   Chest Contusion A chest contusion is a deep bruise on your chest area. Contusions are the result of an injury that caused bleeding under the skin. A chest contusion may involve bruising of the skin, muscles, or ribs. The contusion may turn blue, purple, or yellow. Minor injuries will give you a painless contusion, but more severe contusions may stay painful and swollen for a few weeks. CAUSES  A contusion is usually caused by a blow, trauma, or direct force to an area of the body. SYMPTOMS   Swelling and redness of the injured area.  Discoloration of the injured area.  Tenderness and soreness of the injured area.  Pain. DIAGNOSIS  The diagnosis can be made by taking a history and performing a physical exam. An X-ray, CT scan, or MRI may be needed to determine if there were any associated injuries, such as broken bones (fractures) or internal injuries. TREATMENT  Often, the best treatment for a chest contusion is resting, icing, and applying cold compresses to the injured area. Deep breathing exercises may be recommended to reduce the risk of pneumonia. Over-the-counter medicines may also be recommended for pain control. HOME CARE INSTRUCTIONS   Put ice on the injured area.  Put ice in a plastic bag.  Place a towel between your skin and the bag.  Leave the ice on for 15-20 minutes, 03-04 times a day.  Only take over-the-counter or prescription medicines as directed by your caregiver. Your caregiver may recommend avoiding anti-inflammatory medicines (aspirin, ibuprofen, and naproxen) for 48 hours because these medicines may increase bruising.  Rest the injured area.  Perform deep-breathing exercises as  directed by your caregiver.  Stop smoking if you smoke.  Do not lift objects over 5 pounds (2.3 kg) for 3 days or longer if recommended by your caregiver. SEEK IMMEDIATE MEDICAL CARE IF:   You have increased bruising or swelling.  You have pain that is getting worse.  You have difficulty breathing.  You have dizziness, weakness, or fainting.  You have blood in your urine or stool.  You cough up or vomit blood.  Your swelling or pain is not relieved with medicines. MAKE SURE YOU:   Understand these instructions.  Will watch your condition.  Will get help right away if you are not doing well or get worse. Document Released: 08/19/2001 Document Revised: 08/18/2012 Document Reviewed: 05/17/2012 [facility] Patient Information 2015 [place], [place]. This information is not intended to replace advice given to you by your health care provider. Make sure you discuss any questions you have with your health care provider.
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
## 2015-05-28 NOTE — ED Notes (Signed)
Pt given an incentive spirometer to take home.  Pt instructed on proper use and pt returned demonstration.  Pt also vocalized understanding.

## 2015-05-30 ENCOUNTER — Emergency Department (HOSPITAL_COMMUNITY): Payer: 59

## 2015-05-30 ENCOUNTER — Encounter (HOSPITAL_COMMUNITY): Payer: Self-pay | Admitting: Emergency Medicine

## 2015-05-30 ENCOUNTER — Emergency Department (HOSPITAL_COMMUNITY)
Admission: EM | Admit: 2015-05-30 | Discharge: 2015-05-30 | Disposition: A | Discharge status: Home / Self Care | Payer: 59 | Attending: Emergency Medicine | Admitting: Emergency Medicine

## 2015-05-30 DIAGNOSIS — Z7982 Long term (current) use of aspirin: Secondary | ICD-10-CM | POA: Insufficient documentation

## 2015-05-30 DIAGNOSIS — R109 Unspecified abdominal pain: Secondary | ICD-10-CM | POA: Diagnosis not present

## 2015-05-30 DIAGNOSIS — Z862 Personal history of diseases of the blood and blood-forming organs and certain disorders involving the immune mechanism: Secondary | ICD-10-CM | POA: Insufficient documentation

## 2015-05-30 DIAGNOSIS — Z79899 Other long term (current) drug therapy: Secondary | ICD-10-CM | POA: Diagnosis not present

## 2015-05-30 DIAGNOSIS — I10 Essential (primary) hypertension: Secondary | ICD-10-CM | POA: Insufficient documentation

## 2015-05-30 DIAGNOSIS — Z791 Long term (current) use of non-steroidal anti-inflammatories (NSAID): Secondary | ICD-10-CM | POA: Insufficient documentation

## 2015-05-30 DIAGNOSIS — R112 Nausea with vomiting, unspecified: Secondary | ICD-10-CM | POA: Diagnosis not present

## 2015-05-30 DIAGNOSIS — E119 Type 2 diabetes mellitus without complications: Secondary | ICD-10-CM | POA: Insufficient documentation

## 2015-05-30 DIAGNOSIS — S20212D Contusion of left front wall of thorax, subsequent encounter: Secondary | ICD-10-CM | POA: Diagnosis not present

## 2015-05-30 DIAGNOSIS — F419 Anxiety disorder, unspecified: Secondary | ICD-10-CM | POA: Diagnosis not present

## 2015-05-30 DIAGNOSIS — E780 Pure hypercholesterolemia: Secondary | ICD-10-CM | POA: Insufficient documentation

## 2015-05-30 DIAGNOSIS — K219 Gastro-esophageal reflux disease without esophagitis: Secondary | ICD-10-CM | POA: Insufficient documentation

## 2015-05-30 DIAGNOSIS — S299XXD Unspecified injury of thorax, subsequent encounter: Secondary | ICD-10-CM | POA: Diagnosis present

## 2015-05-30 DIAGNOSIS — S20212A Contusion of left front wall of thorax, initial encounter: Secondary | ICD-10-CM

## 2015-05-30 LAB — I-STAT CHEM 8, ED
BUN: 14 mg/dL (ref 6–20)
CALCIUM ION: 1.15 mmol/L (ref 1.13–1.30)
Chloride: 99 mmol/L — ABNORMAL LOW (ref 101–111)
Creatinine, Ser: 1.1 mg/dL — ABNORMAL HIGH (ref 0.44–1.00)
Glucose, Bld: 212 mg/dL — ABNORMAL HIGH (ref 65–99)
HCT: 35 % — ABNORMAL LOW (ref 36.0–46.0)
Hemoglobin: 11.9 g/dL — ABNORMAL LOW (ref 12.0–15.0)
Potassium: 3.3 mmol/L — ABNORMAL LOW (ref 3.5–5.1)
Sodium: 138 mmol/L (ref 135–145)
TCO2: 24 mmol/L (ref 0–100)

## 2015-05-30 LAB — CBC WITH DIFFERENTIAL/PLATELET
Basophils Absolute: 0 10*3/uL (ref 0.0–0.1)
Basophils Relative: 0 % (ref 0–1)
EOS ABS: 0.1 10*3/uL (ref 0.0–0.7)
EOS PCT: 1 % (ref 0–5)
HCT: 33.1 % — ABNORMAL LOW (ref 36.0–46.0)
HEMOGLOBIN: 10.8 g/dL — AB (ref 12.0–15.0)
LYMPHS ABS: 1.6 10*3/uL (ref 0.7–4.0)
Lymphocytes Relative: 14 % (ref 12–46)
MCH: 31.1 pg (ref 26.0–34.0)
MCHC: 32.6 g/dL (ref 30.0–36.0)
MCV: 95.4 fL (ref 78.0–100.0)
MONO ABS: 0.5 10*3/uL (ref 0.1–1.0)
MONOS PCT: 4 % (ref 3–12)
Neutro Abs: 9.1 10*3/uL — ABNORMAL HIGH (ref 1.7–7.7)
Neutrophils Relative %: 81 % — ABNORMAL HIGH (ref 43–77)
Platelets: 198 10*3/uL (ref 150–400)
RBC: 3.47 MIL/uL — AB (ref 3.87–5.11)
RDW: 12.6 % (ref 11.5–15.5)
WBC: 11.3 10*3/uL — ABNORMAL HIGH (ref 4.0–10.5)

## 2015-05-30 LAB — CBG MONITORING, ED: Glucose-Capillary: 189 mg/dL — ABNORMAL HIGH (ref 65–99)

## 2015-05-30 MED ORDER — ONDANSETRON HCL 4 MG/2ML IJ SOLN
4.0000 mg | Freq: Once | INTRAMUSCULAR | Status: AC
  Administered 2015-05-30: 4 mg via INTRAVENOUS
  Filled 2015-05-30: qty 2

## 2015-05-30 MED ORDER — ONDANSETRON 8 MG PO TBDP
8.0000 mg | ORAL_TABLET | Freq: Three times a day (TID) | ORAL | Status: DC | PRN
Start: 2015-05-30 — End: 2015-09-17

## 2015-05-30 MED ORDER — IOHEXOL 300 MG/ML  SOLN
100.0000 mL | Freq: Once | INTRAMUSCULAR | Status: AC | PRN
  Administered 2015-05-30: 80 mL via INTRAVENOUS
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
## 2015-05-30 NOTE — ED Notes (Signed)
Two unsuccessful IV attempts by this nurse
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
## 2015-05-30 NOTE — Discharge Instructions (Signed)
Chest Contusion A chest contusion is a deep bruise on your chest area. Contusions are the result of an injury that caused bleeding under the skin. A chest contusion may involve bruising of the skin, muscles, or ribs. The contusion may turn blue, purple, or yellow. Minor injuries will give you a painless contusion, but more severe contusions may stay painful and swollen for a few weeks. CAUSES  A contusion is usually caused by a blow, trauma, or direct force to an area of the body. SYMPTOMS   Swelling and redness of the injured area.  Discoloration of the injured area.  Tenderness and soreness of the injured area.  Pain. DIAGNOSIS  The diagnosis can be made by taking a history and performing a physical exam. An X-ray, CT scan, or MRI may be needed to determine if there were any associated injuries, such as broken bones (fractures) or internal injuries. TREATMENT  Often, the best treatment for a chest contusion is resting, icing, and applying cold compresses to the injured area. Deep breathing exercises may be recommended to reduce the risk of pneumonia. Over-the-counter medicines may also be recommended for pain control. HOME CARE INSTRUCTIONS   Put ice on the injured area.  Put ice in a plastic bag.  Place a towel between your skin and the bag.  Leave the ice on for 15-20 minutes, 03-04 times a day.  Only take over-the-counter or prescription medicines as directed by your caregiver. Your caregiver may recommend avoiding anti-inflammatory medicines (aspirin, ibuprofen, and naproxen) for 48 hours because these medicines may increase bruising.  Rest the injured area.  Perform deep-breathing exercises as directed by your caregiver.  Stop smoking if you smoke.  Do not lift objects over 5 pounds (2.3 kg) for 3 days or longer if recommended by your caregiver. SEEK IMMEDIATE MEDICAL CARE IF:   You have increased bruising or swelling.  You have pain that is getting worse.  You have  difficulty breathing.  You have dizziness, weakness, or fainting.  You have blood in your urine or stool.  You cough up or vomit blood.  Your swelling or pain is not relieved with medicines. MAKE SURE YOU:   Understand these instructions.  Will watch your condition.  Will get help right away if you are not doing well or get worse. Document Released: 08/19/2001 Document Revised: 08/18/2012 Document Reviewed: 05/17/2012 [facility] Patient Information 2015 [place], [place]. This information is not intended to replace advice given to you by your health care provider. Make sure you discuss any questions you have with your health care provider.  Nausea and Vomiting Nausea is a sick feeling that often comes before throwing up (vomiting). Vomiting is a reflex where stomach contents come out of your mouth. Vomiting can cause severe loss of body fluids (dehydration). Children and elderly adults can become dehydrated quickly, especially if they also have diarrhea. Nausea and vomiting are symptoms of a condition or disease. It is important to find the cause of your symptoms. CAUSES   Direct irritation of the stomach lining. This irritation can result from increased acid production (gastroesophageal reflux disease), infection, food poisoning, taking certain medicines (such as nonsteroidal anti-inflammatory drugs), alcohol use, or tobacco use.  Signals from the brain.These signals could be caused by a headache, heat exposure, an inner ear disturbance, increased pressure in the brain from injury, infection, a tumor, or a concussion, pain, emotional stimulus, or metabolic problems.  An obstruction in the gastrointestinal tract (bowel obstruction).  Illnesses such as diabetes, hepatitis, gallbladder  problems, appendicitis, kidney problems, cancer, sepsis, atypical symptoms of a heart attack, or eating disorders.  Medical treatments such as chemotherapy and radiation.  Receiving medicine that makes you  sleep (general anesthetic) during surgery. DIAGNOSIS Your caregiver may ask for tests to be done if the problems do not improve after a few days. Tests may also be done if symptoms are severe or if the reason for the nausea and vomiting is not clear. Tests may include:  Urine tests.  Blood tests.  Stool tests.  Cultures (to look for evidence of infection).  X-rays or other imaging studies. Test results can help your caregiver make decisions about treatment or the need for additional tests. TREATMENT You need to stay well hydrated. Drink frequently but in small amounts.You may wish to drink water, sports drinks, clear broth, or eat frozen ice pops or gelatin dessert to help stay hydrated.When you eat, eating slowly may help prevent nausea.There are also some antinausea medicines that may help prevent nausea. HOME CARE INSTRUCTIONS   Take all medicine as directed by your caregiver.  If you do not have an appetite, do not force yourself to eat. However, you must continue to drink fluids.  If you have an appetite, eat a normal diet unless your caregiver tells you differently.  Eat a variety of complex carbohydrates (rice, wheat, potatoes, bread), lean meats, yogurt, fruits, and vegetables.  Avoid high-fat foods because they are more difficult to digest.  Drink enough water and fluids to keep your urine clear or pale yellow.  If you are dehydrated, ask your caregiver for specific rehydration instructions. Signs of dehydration may include:  Severe thirst.  Dry lips and mouth.  Dizziness.  Dark urine.  Decreasing urine frequency and amount.  Confusion.  Rapid breathing or pulse. SEEK IMMEDIATE MEDICAL CARE IF:   You have blood or Delucia flecks (like coffee grounds) in your vomit.  You have black or bloody stools.  You have a severe headache or stiff neck.  You are confused.  You have severe abdominal pain.  You have chest pain or trouble breathing.  You do not  urinate at least once every 8 hours.  You develop cold or clammy skin.  You continue to vomit for longer than 24 to 48 hours.  You have a fever. MAKE SURE YOU:   Understand these instructions.  Will watch your condition.  Will get help right away if you are not doing well or get worse. Document Released: 11/24/2005 Document Revised: 02/16/2012 Document Reviewed: 04/23/2011 [facility] Patient Information 2015 [place], [place]. This information is not intended to replace advice given to you by your health care provider. Make sure you discuss any questions you have with your health care provider.

## 2015-06-07 ENCOUNTER — Encounter: Payer: Self-pay | Admitting: Internal Medicine

## 2015-06-07 ENCOUNTER — Ambulatory Visit (INDEPENDENT_AMBULATORY_CARE_PROVIDER_SITE_OTHER): Payer: 59 | Admitting: Internal Medicine

## 2015-06-07 VITALS — BP 122/66 | HR 66 | Ht 64.0 in | Wt 119.6 lb

## 2015-06-07 DIAGNOSIS — R002 Palpitations: Secondary | ICD-10-CM

## 2015-06-07 NOTE — Patient Instructions (Signed)
Your physician recommends that you schedule a follow-up appointment as needed.    Palpitations A palpitation is the feeling that your heartbeat is irregular. It may feel like your heart is fluttering or skipping a beat. It may also feel like your heart is beating faster than normal. This is usually not a serious problem. In some cases, you may need more medical tests. HOME CARE  Avoid:  Caffeine in coffee, tea, soft drinks, diet pills, and energy drinks.  Chocolate.  Alcohol.  Stop smoking if you smoke.  Reduce your stress and anxiety. Try:  A method that measures bodily functions so you can learn to control them (biofeedback).  Yoga.  Meditation.  Physical activity such as swimming, jogging, or walking.  Get plenty of rest and sleep. GET HELP IF:  Your fast or irregular heartbeat continues after 24 hours.  Your palpitations occur more often. GET HELP RIGHT AWAY IF:   You have chest pain.  You feel short of breath.  You have a very bad headache.  You feel dizzy or pass out (faint). MAKE SURE YOU:   Understand these instructions.  Will watch your condition.  Will get help right away if you are not doing well or get worse. Document Released: 09/02/2008 Document Revised: 04/10/2014 Document Reviewed: 01/23/2012 [facility] Patient Information 2015 [place], [place]. This information is not intended to replace advice given to you by your health care provider. Make sure you discuss any questions you have with your health care provider.
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
## 2015-06-08 ENCOUNTER — Encounter: Payer: Self-pay | Admitting: *Deleted

## 2015-06-12 ENCOUNTER — Ambulatory Visit: Payer: 59 | Admitting: Dietician

## 2015-06-18 ENCOUNTER — Ambulatory Visit
Admission: RE | Admit: 2015-06-18 | Discharge: 2015-06-18 | Disposition: A | Discharge status: Home / Self Care | Payer: 59 | Source: Ambulatory Visit

## 2015-06-18 DIAGNOSIS — Z1231 Encounter for screening mammogram for malignant neoplasm of breast: Secondary | ICD-10-CM

## 2015-06-25 ENCOUNTER — Other Ambulatory Visit: Payer: Self-pay | Admitting: *Deleted

## 2015-06-26 MED ORDER — METFORMIN HCL 500 MG PO TABS: 500.0000 mg | ORAL_TABLET | Freq: Every day | ORAL | Status: DC

## 2015-07-05 ENCOUNTER — Ambulatory Visit: Payer: 59 | Admitting: Internal Medicine

## 2015-07-18 ENCOUNTER — Encounter: Payer: Self-pay | Admitting: Internal Medicine

## 2015-07-18 ENCOUNTER — Ambulatory Visit (INDEPENDENT_AMBULATORY_CARE_PROVIDER_SITE_OTHER): Payer: 59 | Admitting: Internal Medicine

## 2015-07-18 VITALS — BP 116/58 | HR 74 | Temp 98.5°F | Ht 64.0 in | Wt 118.0 lb

## 2015-07-18 DIAGNOSIS — R350 Frequency of micturition: Secondary | ICD-10-CM

## 2015-07-18 DIAGNOSIS — K5732 Diverticulitis of large intestine without perforation or abscess without bleeding: Secondary | ICD-10-CM | POA: Diagnosis not present

## 2015-07-18 DIAGNOSIS — R1032 Left lower quadrant pain: Secondary | ICD-10-CM | POA: Diagnosis not present

## 2015-07-18 HISTORY — DX: Left lower quadrant pain: R10.32

## 2015-07-18 LAB — POCT URINALYSIS DIPSTICK
Glucose, UA: NEGATIVE
Nitrite, UA: NEGATIVE
PH UA: 6
PROTEIN UA: NEGATIVE
Spec Grav, UA: 1.02
Urobilinogen, UA: 4

## 2015-07-18 MED ORDER — METRONIDAZOLE 500 MG PO TABS: 500.0000 mg | ORAL_TABLET | Freq: Three times a day (TID) | ORAL | Status: DC

## 2015-07-18 MED ORDER — CIPROFLOXACIN HCL 500 MG PO TABS: 500.0000 mg | ORAL_TABLET | Freq: Two times a day (BID) | ORAL | Status: DC

## 2015-07-18 NOTE — Patient Instructions (Addendum)
Thank you for coming in today.We are treating you for Diverticulitis. We want to start you on two antibiotics  Flagyl  500 mg 3 times a day, morning, lunch and dinner and Ciprofloxacin 500 mg twice a day - for a total of 10 days  Please follow up as needed in 6 month for maintenance, otherwise as needed      Diverticulitis Diverticulitis is inflammation or infection of small pouches in your colon that form when you have a condition called diverticulosis. The pouches in your colon are called diverticula. Your colon, or large intestine, is where water is absorbed and stool is formed. Complications of diverticulitis can include:  Bleeding.  Severe infection.  Severe pain.  Perforation of your colon.  Obstruction of your colon. CAUSES  Diverticulitis is caused by bacteria. Diverticulitis happens when stool becomes trapped in diverticula. This allows bacteria to grow in the diverticula, which can lead to inflammation and infection. RISK FACTORS People with diverticulosis are at risk for diverticulitis. Eating a diet that does not include enough fiber from fruits and vegetables may make diverticulitis more likely to develop. SYMPTOMS  Symptoms of diverticulitis may include:  Abdominal pain and tenderness. The pain is normally located on the left side of the abdomen, but may occur in other areas.  Fever and chills.  Bloating.  Cramping.  Nausea.  Vomiting.  Constipation.  Diarrhea.  Blood in your stool. DIAGNOSIS  Your health care provider will ask you about your medical history and do a physical exam. You may need to have tests done because many medical conditions can cause the same symptoms as diverticulitis. Tests may include:  Blood tests.  Urine tests.  Imaging tests of the abdomen, including X-rays and CT scans. When your condition is under control, your health care provider may recommend that you have a colonoscopy. A colonoscopy can show how severe your  diverticula are and whether something else is causing your symptoms. TREATMENT  Most cases of diverticulitis are mild and can be treated at home. Treatment may include:  Taking over-the-counter pain medicines.  Following a clear liquid diet.  Taking antibiotic medicines by mouth for 7-10 days. More severe cases may be treated at a hospital. Treatment may include:  Not eating or drinking.  Taking prescription pain medicine.  Receiving antibiotic medicines through an IV tube.  Receiving fluids and nutrition through an IV tube.  Surgery. HOME CARE INSTRUCTIONS   Follow your health care provider's instructions carefully.  Follow a full liquid diet or other diet as directed by your health care provider. After your symptoms improve, your health care provider may tell you to change your diet. He or she may recommend you eat a high-fiber diet. Fruits and vegetables are good sources of fiber. Fiber makes it easier to pass stool.  Take fiber supplements or probiotics as directed by your health care provider.  Only take medicines as directed by your health care provider.  Keep all your follow-up appointments. SEEK MEDICAL CARE IF:   Your pain does not improve.  You have a hard time eating food.  Your bowel movements do not return to normal. SEEK IMMEDIATE MEDICAL CARE IF:   Your pain becomes worse.  Your symptoms do not get better.  Your symptoms suddenly get worse.  You have a fever.  You have repeated vomiting.  You have bloody or black, tarry stools. MAKE SURE YOU:   Understand these instructions.  Will watch your condition.  Will get help right away if you  are not doing well or get worse. Document Released: 09/03/2005 Document Revised: 11/29/2013 Document Reviewed: 10/19/2013 [facility] Patient Information 2015 [place], [place]. This information is not intended to replace advice given to you by your health care provider. Make sure you discuss any questions you have  with your health care provider.

## 2015-07-18 NOTE — Progress Notes (Signed)
   [name] Pamela Chars, MD Phone: [phone]  Subjective:  Past week of abdominal pain. Mostly occurs at night. Patient notices it often in association with flatus. Pain does not radiate anywhere. However she states it is pretty severe and does sometimes keep her up at night. Patient denies any constipation, blood in stools, diarrhea, nausea or vomiting. Patient has noted decreased appetite, however still getting good fluid intake. Diabetes well controlled per last visit, currently taking metformin without any issue in the past.   GU  - Patient has had a partial hysterotomy, still have ovaries. Denies any vaginal bleeding/discharge or pain. - Patient without dysuria, urinary incontinences   All relevant systems were reviewed and were negative unless otherwise noted in the HPI  Past Medical History Reviewed problem list.  Medications- reviewed and updated Current Outpatient Prescriptions  Medication Sig Dispense Refill  . aspirin EC 81 MG tablet Take 81 mg by mouth daily.    . ciprofloxacin (CIPRO) 500 MG tablet Take 1 tablet (500 mg total) by mouth 2 (two) times daily. 20 tablet 0  . glucose blood test strip Check blood sugar once daily fasting 50 each 11  . lisinopril-hydrochlorothiazide (PRINZIDE,ZESTORETIC) 20-25 MG per tablet Take 1 tablet by mouth daily. 90 tablet 3  . metFORMIN (GLUCOPHAGE) 500 MG tablet Take 1 tablet (500 mg total) by mouth daily with breakfast. 90 tablet 1  . metroNIDAZOLE (FLAGYL) 500 MG tablet Take 1 tablet (500 mg total) by mouth 3 (three) times daily. 30 tablet 0  . ondansetron (ZOFRAN-ODT) 8 MG disintegrating tablet Take 1 tablet (8 mg total) by mouth every 8 (eight) hours as needed for nausea or vomiting. 10 tablet 0  . pantoprazole (PROTONIX) 40 MG tablet Take 40 mg by mouth daily.  0  . pravastatin (PRAVACHOL) 20 MG tablet Take 1 tablet (20 mg total) by mouth daily. 90 tablet 3   No current facility-administered medications  for this visit.   Chief complaint-noted No additions to family history Social history- patient is not a smoker  Objective: BP 116/58 mmHg  Pulse 74  Temp(Src) 98.5 F (36.9 C) (Oral)  Ht  (1.626 m)  Wt 118 lb (53.524 kg)  BMI 20.24 kg/m2 Gen: NAD, alert, cooperative with exam Abd: Left lower quadrant tenderness, BS present, no guarding or organomegaly, c-section scar midline- not hernia felt or seen  Ext: No edema, warm, normal tone, moves UE/LE spontaneously Neuro: Alert and oriented, No gross deficits Skin: no rashes no lesions   Assessment/Plan:  Left Lower Quadrant Abdominal Pain. Patient with hysterectomy in the past. No vaginal bleeding/pain. No complaints of dysuria or incontinence. UA not significant. No constipation or hematochezia. Colonoscopy in 2015 significant for diverticulosis. CT abdomen in 2016 without hernias. No significant weight loss  - Likely diverticulitis - Will start patient Flagyl 100 mg TID and Ciprofloxacin 100 mg BID,  - Follow up if pain continues   - Tylenol for pain as needed  -  Possible side effect- metformin associated abdominal pain if pain does not improve with treatment? Vs. Ovaries (CT 2016 negative for mass) and physical exam benign for mass

## 2015-07-18 NOTE — Assessment & Plan Note (Signed)
  Left Lower Quadrant Abdominal Pain. Patient with hysterectomy in the past. No vaginal bleeding/pain. No complaints of dysuria or incontinence. UA not significant. No constipation or hematochezia. Colonoscopy in 2015 significant for diverticulosis. CT abdomen in 2016 without hernias. No significant weight loss  - Likely diverticulitis - Will start patient Flagyl 100 mg TID and Ciprofloxacin 100 mg BID,  - Follow up if pain continues   - Tylenol for pain as needed  -  Possible side effect- metformin associated abdominal pain if pain does not improve with treatment? Vs. Ovaries (CT 2016 negative for mass) and physical exam benign for mass

## 2015-08-02 ENCOUNTER — Other Ambulatory Visit: Payer: Self-pay | Admitting: *Deleted

## 2015-08-03 MED ORDER — PRAVASTATIN SODIUM 20 MG PO TABS: 20.0000 mg | ORAL_TABLET | Freq: Every day | ORAL | Status: DC

## 2015-08-03 MED ORDER — LISINOPRIL-HYDROCHLOROTHIAZIDE 20-25 MG PO TABS: 1.0000 | ORAL_TABLET | Freq: Every day | ORAL | Status: DC

## 2015-09-03 ENCOUNTER — Other Ambulatory Visit: Payer: Self-pay | Admitting: *Deleted

## 2015-09-03 MED ORDER — PANTOPRAZOLE SODIUM 40 MG PO TBEC: 40.0000 mg | DELAYED_RELEASE_TABLET | Freq: Every day | ORAL | Status: DC

## 2015-09-04 ENCOUNTER — Telehealth: Payer: Self-pay | Admitting: Internal Medicine

## 2015-09-04 NOTE — Telephone Encounter (Signed)
Ba[facility] is calling because this pt needs a referral placed for the pt's appt tomorrow at 1pm. The MD that the pt will be seeing is Dr. Ernesto Rutherford and the two Dx codes are E11.9 and H35.372. Thanks! Sadie Reynolds, ASA

## 2015-09-04 NOTE — Telephone Encounter (Signed)
Referral Number is [id number] eff 09/04/15-03/03/16. Confirmation faxed to Marvin at [phone].

## 2015-09-05 LAB — HM DIABETES EYE EXAM

## 2015-09-11 ENCOUNTER — Ambulatory Visit: Payer: 59

## 2015-09-17 ENCOUNTER — Encounter (HOSPITAL_COMMUNITY): Payer: Self-pay | Admitting: Emergency Medicine

## 2015-09-17 ENCOUNTER — Emergency Department (HOSPITAL_COMMUNITY)
Admission: EM | Admit: 2015-09-17 | Discharge: 2015-09-17 | Disposition: A | Discharge status: Home / Self Care | Payer: 59 | Attending: Emergency Medicine | Admitting: Emergency Medicine

## 2015-09-17 DIAGNOSIS — Z862 Personal history of diseases of the blood and blood-forming organs and certain disorders involving the immune mechanism: Secondary | ICD-10-CM | POA: Insufficient documentation

## 2015-09-17 DIAGNOSIS — E7800 Pure hypercholesterolemia, unspecified: Secondary | ICD-10-CM | POA: Diagnosis not present

## 2015-09-17 DIAGNOSIS — I10 Essential (primary) hypertension: Secondary | ICD-10-CM | POA: Diagnosis not present

## 2015-09-17 DIAGNOSIS — R112 Nausea with vomiting, unspecified: Secondary | ICD-10-CM | POA: Diagnosis not present

## 2015-09-17 DIAGNOSIS — J029 Acute pharyngitis, unspecified: Secondary | ICD-10-CM | POA: Diagnosis not present

## 2015-09-17 DIAGNOSIS — Z7982 Long term (current) use of aspirin: Secondary | ICD-10-CM | POA: Diagnosis not present

## 2015-09-17 DIAGNOSIS — K219 Gastro-esophageal reflux disease without esophagitis: Secondary | ICD-10-CM | POA: Insufficient documentation

## 2015-09-17 DIAGNOSIS — E119 Type 2 diabetes mellitus without complications: Secondary | ICD-10-CM | POA: Diagnosis not present

## 2015-09-17 DIAGNOSIS — R079 Chest pain, unspecified: Secondary | ICD-10-CM | POA: Insufficient documentation

## 2015-09-17 DIAGNOSIS — R519 Headache, unspecified: Secondary | ICD-10-CM

## 2015-09-17 DIAGNOSIS — R51 Headache: Secondary | ICD-10-CM | POA: Diagnosis present

## 2015-09-17 LAB — CBG MONITORING, ED: Glucose-Capillary: 105 mg/dL — ABNORMAL HIGH (ref 65–99)

## 2015-09-17 MED ORDER — ONDANSETRON 8 MG PO TBDP: 8.0000 mg | ORAL_TABLET | Freq: Three times a day (TID) | ORAL | Status: DC | PRN

## 2015-09-17 MED ORDER — ONDANSETRON 4 MG PO TBDP
4.0000 mg | ORAL_TABLET | Freq: Once | ORAL | Status: AC
  Administered 2015-09-17: 4 mg via ORAL
  Filled 2015-09-17: qty 1

## 2015-09-17 MED ORDER — TRAMADOL HCL 50 MG PO TABS: 50.0000 mg | ORAL_TABLET | Freq: Four times a day (QID) | ORAL | Status: DC | PRN

## 2015-09-17 MED ORDER — OXYCODONE-ACETAMINOPHEN 5-325 MG PO TABS
1.0000 | ORAL_TABLET | Freq: Once | ORAL | Status: AC
  Administered 2015-09-17: 1 via ORAL
  Filled 2015-09-17: qty 1

## 2015-09-17 NOTE — ED Notes (Signed)
Per pt, states she is suppose to have tooth pulled today-was on antibiotics but they made her sick-complaining of H/A and nausea

## 2015-09-17 NOTE — ED Notes (Signed)
Pt alert and oriented x4. Respirations even and unlabored, bilateral symmetrical rise and fall of chest. Skin warm and dry. In no acute distress. Denies needs.   

## 2015-09-17 NOTE — ED Provider Notes (Signed)
CSN: [id number]     Arrival date & time 09/17/15  0840 History   First MD Initiated Contact with Patient 09/17/15 [phone]     Chief Complaint  Patient presents with  . Headache     (Consider location/radiation/quality/duration/timing/severity/associated sxs/prior Treatment) Patient is a 64 y.o. female presenting with headaches. The history is provided by the patient.  Headache Associated symptoms: nausea, sore throat and vomiting   Associated symptoms: no back pain    patient presents with headache sore throat feeling bad and the feeling that her pills get caught in her chest. States began a few days ago after she started taking clindamycin for a bad tooth. States the tooth is feeling okay and she is supposed to have removed at 11:00 today. She's had some nausea and vomiting. No fevers. Headache is dull. States she has not had clindamycin in the past. No numbness or weakness. No difficulty breathing. No unexplained weight loss.  Past Medical History  Diagnosis Date  . Hypertension   . Hypercholesteremia   . Anxiety     passing of husband  . Anemia   . GERD (gastroesophageal reflux disease)   . Diabetes mellitus without complication (HCC)     metformin  . Abdominal pain, left lower quadrant 07/18/2015    Left Lower Quadrant Abdominal Pain. Patient with hysterectomy in the past. No vaginal bleeding/pain. No complaints of dysuria or incontinence. UA not significant. No constipation or hematochezia. Colonoscopy in 2015 significant for diverticulosis. CT abdomen in 2016 without hernias. No significant weight loss  - Likely diverticulitis - Will start patient Flagyl 100 mg TID and Ciprofloxacin 100 mg    Past Surgical History  Procedure Laterality Date  . Partial hysterectomy     Family History  Problem Relation Age of Onset  . Diabetes Sister   . Diabetes Brother   . Heart disease Brother   . Heart disease Father   . Hyperlipidemia Brother   . Hypertension Brother    Social History   Substance Use Topics  . Smoking status: Never Smoker   . Smokeless tobacco: Former Neurosurgeon    Types: Snuff    Quit date: 12/08/1968  . Alcohol Use: No   OB History    No data available     Review of Systems  Constitutional: Negative for appetite change.  HENT: Positive for sore throat.   Respiratory: Negative for shortness of breath.   Cardiovascular: Positive for chest pain.  Gastrointestinal: Positive for nausea and vomiting.  Musculoskeletal: Negative for back pain.  Skin: Negative for wound.  Neurological: Positive for headaches.  Hematological: Negative for adenopathy.  Psychiatric/Behavioral: Negative for confusion.      Allergies  Review of patient's allergies indicates no known allergies.  Home Medications   Prior to Admission medications   Medication Sig Start Date End Date Taking? Authorizing Provider  aspirin EC 81 MG tablet Take 81 mg by mouth daily.   Yes Historical Provider, MD  clindamycin (CLEOCIN) 150 MG capsule take 2 capsules by mouth immediately then 1 capsule every 6 hours 09/11/15  Yes Historical Provider, MD  glucose blood test strip Check blood sugar once daily fasting 02/12/15  Yes Elenora Gamma, MD  lisinopril-hydrochlorothiazide (PRINZIDE,ZESTORETIC) 20-25 MG per tablet Take 1 tablet by mouth daily. 08/03/15  Yes Asiyah Mayra Reel, MD  metFORMIN (GLUCOPHAGE) 500 MG tablet Take 1 tablet (500 mg total) by mouth daily with breakfast. 06/26/15  Yes Asiyah Mayra Reel, MD  pantoprazole (PROTONIX) 40 MG tablet Take 1 tablet (  40 mg total) by mouth daily. 09/03/15  Yes Asiyah Mayra Reel, MD  pravastatin (PRAVACHOL) 20 MG tablet Take 1 tablet (20 mg total) by mouth daily. 08/03/15  Yes Asiyah Mayra Reel, MD  ciprofloxacin (CIPRO) 500 MG tablet Take 1 tablet (500 mg total) by mouth 2 (two) times daily. Patient not taking: Reported on 09/17/2015 07/18/15   Asiyah Mayra Reel, MD  metroNIDAZOLE (FLAGYL) 500 MG tablet Take 1 tablet (500 mg total) by mouth 3  (three) times daily. Patient not taking: Reported on 09/17/2015 07/18/15   Asiyah Mayra Reel, MD  ondansetron (ZOFRAN-ODT) 8 MG disintegrating tablet Take 1 tablet (8 mg total) by mouth every 8 (eight) hours as needed for nausea or vomiting. 09/17/15   Benjiman Core, MD  traMADol (ULTRAM) 50 MG tablet Take 1 tablet (50 mg total) by mouth every 6 (six) hours as needed. 09/17/15   Benjiman Core, MD   BP 98/49 mmHg  Pulse 65  Temp(Src) 98.8 F (37.1 C) (Oral)  Resp 16  SpO2 98% Physical Exam  HENT:  Head: Atraumatic.  Mouth/Throat: No oropharyngeal exudate.  Some bad teeth particular posteriorly. No swelling of the gums. No swelling or floor the mouth.  Cardiovascular: Normal rate.   Pulmonary/Chest: Effort normal.  Abdominal: Soft.  Musculoskeletal: Normal range of motion.  Neurological: She is alert.  Skin: Skin is warm.    ED Course  Procedures (including critical care time) Labs Review Labs Reviewed  CBG MONITORING, ED - Abnormal; Notable for the following:    Glucose-Capillary 105 (*)    All other components within normal limits    Imaging Review No results found. I have personally reviewed and evaluated these images and lab results as part of my medical decision-making.   EKG Interpretation None      MDM   Final diagnoses:  Nonintractable headache, unspecified chronicity pattern, unspecified headache type  Non-intractable vomiting with nausea, vomiting of unspecified type    Patient with headache. Nausea and vomiting. Started after being started on clindamycin. Has been off for the last couple days with some symptoms have remained. Glucose is reassuring. Will treat symptomatically and follow-up as needed. Has follow-up later today to get her teeth removed. Doubt severe intracranial injury/abnormality or severe intrathoracic abnormality, however with the feeling of pills getting stuck she will need to follow-up with her primary care doctor. She has tolerated  orals in the ER.    Benjiman Core, MD 09/17/15 [phone]

## 2015-09-17 NOTE — Discharge Instructions (Signed)
Your headache and nausea and vomiting could be due to the medication. Take the medicines as needed and follow-up if symptoms do not improve.  General Headache Without Cause A headache is pain or discomfort felt around the head or neck area. The specific cause of a headache may not be found. There are many causes and types of headaches. A few common ones are:  Tension headaches.  Migraine headaches.  Cluster headaches.  Chronic daily headaches. HOME CARE INSTRUCTIONS  Watch your condition for any changes. Take these steps to help with your condition: Managing Pain  Take over-the-counter and prescription medicines only as told by your health care provider.  Lie down in a dark, quiet room when you have a headache.  If directed, apply ice to the head and neck area:  Put ice in a plastic bag.  Place a towel between your skin and the bag.  Leave the ice on for 20 minutes, 2-3 times per day.  Use a heating pad or hot shower to apply heat to the head and neck area as told by your health care provider.  Keep lights dim if bright lights bother you or make your headaches worse. Eating and Drinking  Eat meals on a regular schedule.  Limit alcohol use.  Decrease the amount of caffeine you drink, or stop drinking caffeine. General Instructions  Keep all follow-up visits as told by your health care provider. This is important.  Keep a headache journal to help find out what may trigger your headaches. For example, write down:  What you eat and drink.  How much sleep you get.  Any change to your diet or medicines.  Try massage or other relaxation techniques.  Limit stress.  Sit up straight, and do not tense your muscles.  Do not use tobacco products, including cigarettes, chewing tobacco, or e-cigarettes. If you need help quitting, ask your health care provider.  Exercise regularly as told by your health care provider.  Sleep on a regular schedule. Get 7-9 hours of sleep,  or the amount recommended by your health care provider. SEEK MEDICAL CARE IF:   Your symptoms are not helped by medicine.  You have a headache that is different from the usual headache.  You have nausea or you vomit.  You have a fever. SEEK IMMEDIATE MEDICAL CARE IF:   Your headache becomes severe.  You have repeated vomiting.  You have a stiff neck.  You have a loss of vision.  You have problems with speech.  You have pain in the eye or ear.  You have muscular weakness or loss of muscle control.  You lose your balance or have trouble walking.  You feel faint or pass out.  You have confusion.   This information is not intended to replace advice given to you by your health care provider. Make sure you discuss any questions you have with your health care provider.   Document Released: 11/24/2005 Document Revised: 08/15/2015 Document Reviewed: 03/19/2015 Elsevier Interactive Patient Education 2016 [employer]  Nausea and Vomiting Nausea is a sick feeling that often comes before throwing up (vomiting). Vomiting is a reflex where stomach contents come out of your mouth. Vomiting can cause severe loss of body fluids (dehydration). Children and elderly adults can become dehydrated quickly, especially if they also have diarrhea. Nausea and vomiting are symptoms of a condition or disease. It is important to find the cause of your symptoms. CAUSES   Direct irritation of the stomach lining. This irritation can  result from increased acid production (gastroesophageal reflux disease), infection, food poisoning, taking certain medicines (such as nonsteroidal anti-inflammatory drugs), alcohol use, or tobacco use.  Signals from the brain.These signals could be caused by a headache, heat exposure, an inner ear disturbance, increased pressure in the brain from injury, infection, a tumor, or a concussion, pain, emotional stimulus, or metabolic problems.  An obstruction in the  gastrointestinal tract (bowel obstruction).  Illnesses such as diabetes, hepatitis, gallbladder problems, appendicitis, kidney problems, cancer, sepsis, atypical symptoms of a heart attack, or eating disorders.  Medical treatments such as chemotherapy and radiation.  Receiving medicine that makes you sleep (general anesthetic) during surgery. DIAGNOSIS Your caregiver may ask for tests to be done if the problems do not improve after a few days. Tests may also be done if symptoms are severe or if the reason for the nausea and vomiting is not clear. Tests may include:  Urine tests.  Blood tests.  Stool tests.  Cultures (to look for evidence of infection).  X-rays or other imaging studies. Test results can help your caregiver make decisions about treatment or the need for additional tests. TREATMENT You need to stay well hydrated. Drink frequently but in small amounts.You may wish to drink water, sports drinks, clear broth, or eat frozen ice pops or gelatin dessert to help stay hydrated.When you eat, eating slowly may help prevent nausea.There are also some antinausea medicines that may help prevent nausea. HOME CARE INSTRUCTIONS   Take all medicine as directed by your caregiver.  If you do not have an appetite, do not force yourself to eat. However, you must continue to drink fluids.  If you have an appetite, eat a normal diet unless your caregiver tells you differently.  Eat a variety of complex carbohydrates (rice, wheat, potatoes, bread), lean meats, yogurt, fruits, and vegetables.  Avoid high-fat foods because they are more difficult to digest.  Drink enough water and fluids to keep your urine clear or pale yellow.  If you are dehydrated, ask your caregiver for specific rehydration instructions. Signs of dehydration may include:  Severe thirst.  Dry lips and mouth.  Dizziness.  Dark urine.  Decreasing urine frequency and amount.  Confusion.  Rapid breathing or  pulse. SEEK IMMEDIATE MEDICAL CARE IF:   You have blood or [place] flecks (like coffee grounds) in your vomit.  You have black or bloody stools.  You have a severe headache or stiff neck.  You are confused.  You have severe abdominal pain.  You have chest pain or trouble breathing.  You do not urinate at least once every 8 hours.  You develop cold or clammy skin.  You continue to vomit for longer than 24 to 48 hours.  You have a fever. MAKE SURE YOU:   Understand these instructions.  Will watch your condition.  Will get help right away if you are not doing well or get worse.   This information is not intended to replace advice given to you by your health care provider. Make sure you discuss any questions you have with your health care provider.   Document Released: 11/24/2005 Document Revised: 02/16/2012 Document Reviewed: 04/23/2011 Elsevier Interactive Patient Education [employer]

## 2015-09-17 NOTE — ED Notes (Signed)
Pt escorted to discharge window. Pt verbalized understanding discharge instructions. In no acute distress.  

## 2015-10-02 ENCOUNTER — Ambulatory Visit (INDEPENDENT_AMBULATORY_CARE_PROVIDER_SITE_OTHER): Payer: 59 | Admitting: Internal Medicine

## 2015-10-02 ENCOUNTER — Encounter: Payer: Self-pay | Admitting: Internal Medicine

## 2015-10-02 VITALS — BP 134/53 | HR 73 | Temp 98.5°F | Ht 64.0 in | Wt 112.2 lb

## 2015-10-02 DIAGNOSIS — Z23 Encounter for immunization: Secondary | ICD-10-CM | POA: Diagnosis not present

## 2015-10-02 DIAGNOSIS — R630 Anorexia: Secondary | ICD-10-CM | POA: Diagnosis not present

## 2015-10-02 LAB — CBC
HEMATOCRIT: 34.3 % — AB (ref 36.0–46.0)
HEMOGLOBIN: 11.6 g/dL — AB (ref 12.0–15.0)
MCH: 32.5 pg (ref 26.0–34.0)
MCHC: 33.8 g/dL (ref 30.0–36.0)
MCV: 96.1 fL (ref 78.0–100.0)
MPV: 11.6 fL (ref 8.6–12.4)
Platelets: 290 10*3/uL (ref 150–400)
RBC: 3.57 MIL/uL — AB (ref 3.87–5.11)
RDW: 13.2 % (ref 11.5–15.5)
WBC: 4.2 10*3/uL (ref 4.0–10.5)

## 2015-10-02 LAB — VITAMIN B12: VITAMIN B 12: 1019 pg/mL — AB (ref 211–911)

## 2015-10-02 LAB — HIV ANTIBODY (ROUTINE TESTING W REFLEX): HIV: NONREACTIVE

## 2015-10-02 LAB — HEPATITIS C ANTIBODY: HCV AB: NEGATIVE

## 2015-10-02 LAB — TSH: TSH: 0.897 u[IU]/mL (ref 0.350–4.500)

## 2015-10-02 NOTE — Assessment & Plan Note (Signed)
Decreased Appetite: Patient with 3 week hx of increased satiety with an unintentional 3 kg weight loss.  Patient with a colonoscopy in 2015, mammogram in 2016. 2 year hx of reflux. Physical exam positive for hyperreflexia, otherwise no ttp or abdominal exam  -  Weight loss, hyperrreflexia, and fatigue, recent hx of palpations in the past year - will get a TSH to r/u thyriod involvement  - CMET and CBC, patient hx of anemia -- could be contributing to symptoms of fatigue  - 5 year hx of metformin - not a new drug, will check B12 for fatigue and anemia - Last A1C 6.2 in June (always well controlled) so unlikely gastroparesis   - If results are normal will refer to GI for an EGD, patient has never had one and consider hx of GERD and tobacco snuff use has a higher likelihood of having cancer

## 2015-10-02 NOTE — Patient Instructions (Addendum)
We will do some blood work today. Based off that blood work, we will refer you to Gastroenterology. I want to see you for follow up in the next two months as well for regular follow up.

## 2015-10-02 NOTE — Progress Notes (Signed)
Patient ID: Pamela [name], female   DOB: 1951/04/10, 64 y.o.   MRN: [id number]     [facility] Noralee CharsAsiyah Elmond Poehlman, MD Phone: [phone]  Subjective:   #Decreased Appetite:   On October 10th patient had a dental appointment, patient received antibiotics for this appointment. She states that antibiotic which she received made her sick. Following the next couple of weeks she has had decreased appetite/increased satiety. She states that she can't eat more than a small cup of food before feeling full.  Nothing that seems to make it better or worse. Patient denies ever vomiting, however she indicates that she feels nauseated about once or twice a week, with no particular association.  Patient states that she does have epigastric pain, however this intermittent.  Patient denies any intentional weight loss. Patient denies feeling depressed. However, indicates that this was the month that her husband died. She indicates that she has great support from her family and friends.    Patient has a hx of acid reflux only for about about two years. No hx of EGD, has had a colonoscopy before showing only diverculticlosis. Patient has been on metformin for about 5 years. Last A1C was 6.1 in June. Patient with a hx of snuff for about 30 years, quit ten years ago.   Endorse increasing fatigue  No fever, chills, night time sweats, no issues with swallowing,  No diarrhea, sometimes constipation, however not very often as patient tries to maintain a health diet.   All relevant systems were reviewed and were negative unless otherwise noted in the HPI  Past Medical History Reviewed problem list.  Medications- reviewed and updated Current Outpatient Prescriptions  Medication Sig Dispense Refill  . aspirin EC 81 MG tablet Take 81 mg by mouth daily.    . ciprofloxacin (CIPRO) 500 MG tablet Take 1 tablet (500 mg total) by mouth 2 (two) times daily. (Patient not taking: Reported on 09/17/2015) 20 tablet 0    . clindamycin (CLEOCIN) 150 MG capsule take 2 capsules by mouth immediately then 1 capsule every 6 hours  0  . glucose blood test strip Check blood sugar once daily fasting 50 each 11  . lisinopril-hydrochlorothiazide (PRINZIDE,ZESTORETIC) 20-25 MG per tablet Take 1 tablet by mouth daily. 90 tablet 0  . metFORMIN (GLUCOPHAGE) 500 MG tablet Take 1 tablet (500 mg total) by mouth daily with breakfast. 90 tablet 1  . metroNIDAZOLE (FLAGYL) 500 MG tablet Take 1 tablet (500 mg total) by mouth 3 (three) times daily. (Patient not taking: Reported on 09/17/2015) 30 tablet 0  . ondansetron (ZOFRAN-ODT) 8 MG disintegrating tablet Take 1 tablet (8 mg total) by mouth every 8 (eight) hours as needed for nausea or vomiting. 20 tablet 0  . pantoprazole (PROTONIX) 40 MG tablet Take 1 tablet (40 mg total) by mouth daily. 90 tablet 3  . pravastatin (PRAVACHOL) 20 MG tablet Take 1 tablet (20 mg total) by mouth daily. 90 tablet 3  . traMADol (ULTRAM) 50 MG tablet Take 1 tablet (50 mg total) by mouth every 6 (six) hours as needed. 8 tablet 0   No current facility-administered medications for this visit.   Chief complaint-noted No additions to family history Social history- patient with snuff   Objective: There were no vitals taken for this visit. Gen: NAD, alert, cooperative with exam CV: RRR, good S1/S2, no murmur, cap refill <3 Resp: CTABL, no wheezes, non-labored, normal thyroid, no lumps or bumps noted  Abd: SNTND, BS present, no guarding or  organomegaly Ext: No edema, warm, normal tone, moves UE/LE spontaneously,hyperreflexia Neuro: Alert and oriented, No gross deficits Skin: no rashes no lesions  Assessment/Plan: See problem based a/p   Appetite impaired Decreased Appetite: Patient with 3 week hx of increased satiety with an unintentional 3 kg weight loss.  Patient with a colonoscopy in 2015, mammogram in 2016. 2 year hx of reflux. Physical exam positive for hyperreflexia, otherwise no ttp or  abdominal exam  -  Weight loss, hyperrreflexia, and fatigue, recent hx of palpations in the past year - will get a TSH to r/u thyriod involvement  - CMET and CBC, patient hx of anemia -- could be contributing to symptoms of fatigue  - 5 year hx of metformin - not a new drug, will check B12 for fatigue and anemia - Last A1C 6.2 in June (always well controlled) so unlikely gastroparesis   - If results are normal will refer to GI for an EGD, patient has never had one and consider hx of GERD and tobacco snuff use has a higher likelihood of having cancer

## 2015-10-03 LAB — COMPREHENSIVE METABOLIC PANEL
ALBUMIN: 4.6 g/dL (ref 3.6–5.1)
ALT: 9 U/L (ref 6–29)
AST: 11 U/L (ref 10–35)
Alkaline Phosphatase: 63 U/L (ref 33–130)
BUN: 14 mg/dL (ref 7–25)
CALCIUM: 9.9 mg/dL (ref 8.6–10.4)
CHLORIDE: 99 mmol/L (ref 98–110)
CO2: 26 mmol/L (ref 20–31)
Creat: 1.17 mg/dL — ABNORMAL HIGH (ref 0.50–0.99)
Glucose, Bld: 114 mg/dL — ABNORMAL HIGH (ref 65–99)
POTASSIUM: 4.2 mmol/L (ref 3.5–5.3)
SODIUM: 139 mmol/L (ref 135–146)
TOTAL PROTEIN: 7.7 g/dL (ref 6.1–8.1)
Total Bilirubin: 0.8 mg/dL (ref 0.2–1.2)

## 2015-10-09 ENCOUNTER — Telehealth: Payer: Self-pay | Admitting: Internal Medicine

## 2015-10-09 NOTE — Telephone Encounter (Signed)
Called patient to discuss her labs. At last appointment, patient came in with concern for loss of appetite and feeling poorly. We went ahead and got some labs which were negative for any significant cause of appetite loss. Next step was to refer patient to GI for further work up. However patient states that since the that visit. She has significantly improved in appetite, and feels much better. She states that she thinks her lack of appetite might have been in part to the month of October being the month that she lost her husband.

## 2015-11-05 ENCOUNTER — Other Ambulatory Visit: Payer: Self-pay | Admitting: *Deleted

## 2015-11-07 MED ORDER — LISINOPRIL-HYDROCHLOROTHIAZIDE 20-25 MG PO TABS: 1.0000 | ORAL_TABLET | Freq: Every day | ORAL | Status: DC

## 2015-12-21 ENCOUNTER — Encounter: Payer: Self-pay | Admitting: Internal Medicine

## 2015-12-21 ENCOUNTER — Ambulatory Visit (INDEPENDENT_AMBULATORY_CARE_PROVIDER_SITE_OTHER): Payer: BLUE CROSS/BLUE SHIELD | Admitting: Internal Medicine

## 2015-12-21 VITALS — BP 120/53 | HR 89 | Temp 98.2°F | Ht 64.0 in | Wt 111.5 lb

## 2015-12-21 DIAGNOSIS — E119 Type 2 diabetes mellitus without complications: Secondary | ICD-10-CM | POA: Diagnosis not present

## 2015-12-21 DIAGNOSIS — I10 Essential (primary) hypertension: Secondary | ICD-10-CM

## 2015-12-21 LAB — POCT GLYCOSYLATED HEMOGLOBIN (HGB A1C): Hemoglobin A1C: 6

## 2015-12-21 MED ORDER — METFORMIN HCL 500 MG PO TABS: 500.0000 mg | ORAL_TABLET | Freq: Every day | ORAL | Status: DC

## 2015-12-21 MED ORDER — PRAVASTATIN SODIUM 20 MG PO TABS: 20.0000 mg | ORAL_TABLET | Freq: Every day | ORAL | Status: DC

## 2015-12-21 MED ORDER — LISINOPRIL-HYDROCHLOROTHIAZIDE 20-25 MG PO TABS: 1.0000 | ORAL_TABLET | Freq: Every day | ORAL | Status: DC

## 2015-12-21 NOTE — Progress Notes (Signed)
Patient ID: Pamela [name], female   DOB: March 15, 1951, 65 y.o.   MRN: [id number]   [facility] Noralee CharsAsiyah Mikell, MD Phone: [phone]  Reason For Visit: Physical Exam   # Patient doing well overall. Has no complaints.   #HTN, no issues. No dizziness, chest pain, and SOB. Patient taking meds without issue.   # Diabetes: Takes metformin without issue. Blood sugars within normal range when checked. Patient denies no increased polyuria, no n/v   Review of Systems  Constitutional: Negative for fever and weight loss.  HENT: Negative for congestion.   Respiratory: Negative for cough and shortness of breath.   Cardiovascular: Negative for chest pain.  Gastrointestinal: Negative for nausea, vomiting, abdominal pain, diarrhea and constipation.  Genitourinary: Negative for dysuria and frequency.  Musculoskeletal: Negative for myalgias.  Neurological: Negative for dizziness, tingling and headaches.   Past Medical History Reviewed problem list.  Medications- reviewed and updated No additions to family history Social history- Non-smoker   Objective: BP 120/53 mmHg  Pulse 89  Temp(Src) 98.2 F (36.8 C) (Oral)  Ht 5\' 4"  (1.626 m)  Wt 111 lb 8 oz (50.576 kg)  BMI 19.13 kg/m2 Gen: NAD, alert, cooperative with exam HEENT: NCAT, EOMI, PERRL, TMs nml Neck: FROM, supple CV: RRR, good S1/S2, no murmur, cap refill <3 Resp: CTABL, no wheezes, non-labored Abd: SNTND, BS present, no guarding or organomegaly Ext: No edema, warm, normal tone, moves UE/LE spontaneously Neuro: Alert and oriented, No gross deficits Skin: no rashes no lesions  Diabetic Foot Check - 2+ DPs  Appearance - no lesions, ulcers or calluses Skin - no unusual pallor or redness Monofilament testing - 7/7 on both feet.  Right - Great toe, medial, central, lateral ball and posterior foot intact Left - Great toe, medial, central, lateral ball and posterior foot intact  Assessment/Plan: See problem based  a/p  HYPERTENSION, BENIGN SYSTEMIC HTN, Well controlled  - HCTZ-Lisinopril, without issue  - K wnls in October 2016  Controlled diabetes mellitus type II without complication A1C 6.0 12/2015  Does not need any eye appointment  Foot exam wnl, good sensation On lisinopril  Continue Metformin

## 2015-12-21 NOTE — Assessment & Plan Note (Signed)
A1C 6.0 12/2015  Does not need any eye appointment  Foot exam wnl, good sensation On lisinopril  Continue Metformin

## 2015-12-21 NOTE — Assessment & Plan Note (Signed)
HTN, Well controlled  - HCTZ-Lisinopril, without issue  - K wnls in October 2016

## 2015-12-21 NOTE — Patient Instructions (Signed)
Lovely seeing you today. Please follow up with me in 6 months.

## 2015-12-22 LAB — LIPID PANEL
Cholesterol: 140 mg/dL (ref 125–200)
HDL: 48 mg/dL (ref >=46)
LDL CALC: 64 mg/dL (ref <130)
TRIGLYCERIDES: 138 mg/dL (ref <150)
Total CHOL/HDL Ratio: 2.9 Ratio (ref <=5.0)
VLDL: 28 mg/dL (ref <30)

## 2015-12-31 ENCOUNTER — Encounter: Payer: Self-pay | Admitting: Internal Medicine

## 2016-02-28 ENCOUNTER — Ambulatory Visit: Payer: BLUE CROSS/BLUE SHIELD | Admitting: Internal Medicine

## 2016-03-11 ENCOUNTER — Encounter: Payer: Self-pay | Admitting: Family Medicine

## 2016-03-11 ENCOUNTER — Ambulatory Visit (INDEPENDENT_AMBULATORY_CARE_PROVIDER_SITE_OTHER): Payer: BLUE CROSS/BLUE SHIELD | Admitting: Family Medicine

## 2016-03-11 VITALS — BP 151/68 | HR 79 | Temp 97.8°F | Ht 64.0 in | Wt 113.0 lb

## 2016-03-11 DIAGNOSIS — L03011 Cellulitis of right finger: Secondary | ICD-10-CM

## 2016-03-11 MED ORDER — AMOXICILLIN-POT CLAVULANATE 875-125 MG PO TABS: 1.0000 | ORAL_TABLET | Freq: Two times a day (BID) | ORAL | Status: DC

## 2016-03-11 NOTE — Progress Notes (Signed)
Date of Visit: 03/11/2016   HPI:  Patient presents for a same day appointment to discuss pain in R finger.  Had hangnail about 2 weeks ago. Used nail clippers to clip nail. Since then, edge of nail has become swollen and painful. Notes that finger is also swollen at the tip. No fever, vomiting. Eating & drinking well. Has history of diabetes. Patient notes that she HAS put the finger in her mouth on occasion. Has been doing warm water soaks and applying neosporin.  ROS: See HPI  PMFSH: history of constipation, type 2 diabetes, diverticulosis, GERD, hyperlipidemia, hypertension, osteopenia  PHYSICAL EXAM: BP 151/68 mmHg  Pulse 79  Temp(Src) 97.8 F (36.6 C) (Oral)  Ht 5\' 4"  (1.626 m)  Wt 113 lb (51.256 kg)  BMI 19.39 kg/m2 Gen: NAD, pleasant, cooperative, well appearing Extremities: R first finger with edema of distal finger from the PIP joint onward. Radial edge of nail fold with some darkening & swelling, mildly tender to palpation. No purulence expressed. Good capillary refill. See photo below. No warmth.      ASSESSMENT/PLAN:  1. Acute paronychia - with history of diabetes warrants aggressive tx with antibiotics to prevent further progression. As the nail has been exposed to oral bacteria will rx augmentin. Follow up if not improving. Discussed return precautions. Handout given. Continue soaks & topical antibiotic ointment.  FOLLOW UP: Follow up as needed if symptoms worsen or fail to improve.    GrenadaBrittany J. Pollie MeyerMcIntyre, MD [facility]

## 2016-03-11 NOTE — Patient Instructions (Signed)
Take augmentin 1 pill twice daily for 7 days Return if not getting better  Keep doing the soaks and neosporin  Be well, Dr. Haskel Khan Paronychia is an infection of the skin that surrounds a nail. It usually affects the skin around a fingernail, but it may also occur near a toenail. It often causes pain and swelling around the nail. This condition may come on suddenly or develop over a longer period. In some cases, a collection of pus (abscess) can form near or under the nail. Usually, paronychia is not serious and it clears up with treatment. CAUSES This condition may be caused by bacteria or fungi. It is commonly caused by either Streptococcus or Staphylococcus bacteria. The bacteria or fungi often cause the infection by getting into the affected area through an opening in the skin, such as a cut or a hangnail. RISK FACTORS This condition is more likely to develop in:  People who get their hands wet often, such as those who work as Fish farm manager, bartenders, or nurses.  People who bite their fingernails or suck their thumbs.  People who trim their nails too short.  People who have hangnails or injured fingertips.  People who get manicures.  People who have diabetes. SYMPTOMS Symptoms of this condition include:  Redness and swelling of the skin near the nail.  Tenderness around the nail when you touch the area.  Pus-filled bumps under the cuticle. The cuticle is the skin at the base or sides of the nail.  Fluid or pus under the nail.  Throbbing pain in the area. DIAGNOSIS This condition is usually diagnosed with a physical exam. In some cases, a sample of pus may be taken from an abscess to be tested in a lab. This can help to determine what type of bacteria or fungi is causing the condition. TREATMENT Treatment for this condition depends on the cause and severity of the condition. If the condition is mild, it may clear up on its own in a few days. Your health care  provider may recommend soaking the affected area in warm water a few times a day. When treatment is needed, the options may include:  Antibiotic medicine, if the condition is caused by a bacterial infection.  Antifungal medicine, if the condition is caused by a fungal infection.  Incision and drainage, if an abscess is present. In this procedure, the health care provider will cut open the abscess so the pus can drain out. HOME CARE INSTRUCTIONS  Soak the affected area in warm water if directed to do so by your health care provider. You may be told to do this for 20 minutes, 2-3 times a day. Keep the area dry in between soakings.  Take medicines only as directed by your health care provider.  If you were prescribed an antibiotic medicine, finish all of it even if you start to feel better.  Keep the affected area clean.  Do not try to drain a fluid-filled bump yourself.  If you will be washing dishes or performing other tasks that require your hands to get wet, wear rubber gloves. You should also wear gloves if your hands might come in contact with irritating substances, such as cleaners or chemicals.  Follow your health care provider's instructions about:  Wound care.  Bandage (dressing) changes and removal. SEEK MEDICAL CARE IF:  Your symptoms get worse or do not improve with treatment.  You have a fever or chills.  You have redness spreading from the affected  area.  You have continued or increased fluid, blood, or pus coming from the affected area.  Your finger or knuckle becomes swollen or is difficult to move.   This information is not intended to replace advice given to you by your health care provider. Make sure you discuss any questions you have with your health care provider.   Document Released: 05/20/2001 Document Revised: 04/10/2015 Document Reviewed: 11/01/2014 Elsevier Interactive Patient Education [employer] Elsevier Inc.

## 2016-03-17 ENCOUNTER — Other Ambulatory Visit: Payer: Self-pay | Admitting: *Deleted

## 2016-03-17 MED ORDER — GLUCOSE BLOOD VI STRP: ORAL_STRIP | Status: DC

## 2016-03-25 ENCOUNTER — Ambulatory Visit (INDEPENDENT_AMBULATORY_CARE_PROVIDER_SITE_OTHER): Payer: Self-pay | Admitting: Family Medicine

## 2016-03-25 ENCOUNTER — Other Ambulatory Visit (HOSPITAL_COMMUNITY)
Admission: RE | Admit: 2016-03-25 | Discharge: 2016-03-25 | Disposition: A | Discharge status: Home / Self Care | Payer: No Typology Code available for payment source | Source: Ambulatory Visit | Attending: Family Medicine | Admitting: Family Medicine

## 2016-03-25 ENCOUNTER — Encounter: Payer: Self-pay | Admitting: Family Medicine

## 2016-03-25 VITALS — BP 133/76 | HR 103 | Temp 98.3°F | Ht 64.0 in | Wt 113.2 lb

## 2016-03-25 DIAGNOSIS — N760 Acute vaginitis: Secondary | ICD-10-CM | POA: Insufficient documentation

## 2016-03-25 DIAGNOSIS — L03011 Cellulitis of right finger: Secondary | ICD-10-CM

## 2016-03-25 DIAGNOSIS — R300 Dysuria: Secondary | ICD-10-CM

## 2016-03-25 DIAGNOSIS — Z113 Encounter for screening for infections with a predominantly sexual mode of transmission: Secondary | ICD-10-CM

## 2016-03-25 LAB — POCT URINALYSIS DIPSTICK
BILIRUBIN UA: NEGATIVE
GLUCOSE UA: NEGATIVE
Ketones, UA: NEGATIVE
LEUKOCYTES UA: NEGATIVE
NITRITE UA: NEGATIVE
Protein, UA: NEGATIVE
RBC UA: NEGATIVE
Spec Grav, UA: 1.02
Urobilinogen, UA: 0.2
pH, UA: 8

## 2016-03-25 LAB — POCT WET PREP (WET MOUNT): CLUE CELLS WET PREP WHIFF POC: NEGATIVE

## 2016-03-25 NOTE — Progress Notes (Signed)
Date of Visit: 03/25/2016   HPI:  Patient presents to follow up on paronychia of R index finger.  Paronychia - got better with course of augmentin. Over the weekend she noted having some return of tenderness after washing dishes. Denies redness, hand swelling, or fevers. Has not been doing soaks. Does not wear gloves when she washes dishes.  Vulvar discomfort - over the weekend had intercourse with boyfriend. Previously had not had sex in years. Had some irritation that day. Denies vaginal discharge, pelvic pain, vaginal bleeding. Has not had period in several years. Did not use a condom. Had some mild burning with urination the day that she had intercourse. No symptoms at present. Agreeable to checking for STDs today. Declines pap smear or speculum exam.  ROS: See HPI.  PMFSH: history of constipation, osteopenia, hyperlipidemia, GERD, hypertension, type 2 diabetes  PHYSICAL EXAM: BP 133/76 mmHg  Pulse 103  Temp(Src) 98.3 F (36.8 C) (Oral)  Ht 5\' 4"  (1.626 m)  Wt 113 lb 3.2 oz (51.347 kg)  BMI 19.42 kg/m2 Gen: NAD, pleasant, cooperative, well appearing Extremities: R index finger with mild swelling distally, no redness or warmth. Very mild tenderness with deep palpation of lateral aspect of nail fold. No purulence. Full ROM of hand/fingers. 2+ radial pulse on R. Sensation intact distally. GU: normal appearing external genitalia without lesions. Blind wet prep & gc/chlamydia swabs obtained as patient declined speculum exam.  ASSESSMENT/PLAN:  1. Paronychia - no signs of definite infection at present. Improved after augmentin.  Plan: - soak nail in warm soapy water for 20 mins 2-3 times per day - topical antibiotic ointment to nail after soaks - wear gloves whenever hands are exposed to dirty substances/doing dishes - follow up if not improving.  2. Vulvar discomfort - likely was transient in setting of new sexual debut in postmenopausal woman. UA unremarkable. Exam limited by not  wanting speculum examination. Will check blind wet prep & gc/chlamydia. Given recent sexual encounter will also check HIV RNA and RPR. Counseled patient that RPR may not be positive yet and recommend she have this repeated in 1-2 months to be sure it remains negative. Discussed importance of condoms for preventing STD transmission.  FOLLOW UP: Follow up as needed if symptoms worsen or fail to improve.    GrenadaBrittany J. Pollie MeyerMcIntyre, MD [facility]

## 2016-03-25 NOTE — Patient Instructions (Signed)
Checking for STDs today The only way to prevent STDs is to use condoms  For your finger, soak the finger in warm soapy water for 20 minutes 2-3 times per day Keep it dry in between soaks Apply over the counter antibacterial ointment to the nail after soaks  Wear gloves while washing dishes or doing any other activities where your hands will get dirty  Follow up if finger not improving with this  Be well, Dr. Pollie MeyerMcIntyre

## 2016-03-26 LAB — CERVICOVAGINAL ANCILLARY ONLY
CHLAMYDIA, DNA PROBE: NEGATIVE
Neisseria Gonorrhea: NEGATIVE
TRICH (WINDOWPATH): NEGATIVE

## 2016-03-26 LAB — RPR

## 2016-03-26 LAB — HIV-1 RNA ULTRAQUANT REFLEX TO GENTYP+
HIV 1 RNA Quant: <20 copies/mL (ref <20)
HIV-1 RNA Quant, Log: <1.3 Log copies/mL (ref <1.30)

## 2016-07-08 ENCOUNTER — Other Ambulatory Visit: Payer: Self-pay | Admitting: Internal Medicine

## 2016-07-08 DIAGNOSIS — Z23 Encounter for immunization: Secondary | ICD-10-CM | POA: Diagnosis not present

## 2016-07-08 DIAGNOSIS — R69 Illness, unspecified: Secondary | ICD-10-CM | POA: Diagnosis not present

## 2016-07-08 MED ORDER — GLUCOSE BLOOD VI STRP: ORAL_STRIP | 11 refills | Status: DC

## 2016-07-08 NOTE — Telephone Encounter (Signed)
Pt is calling for a refill on her test strips. Please send to the pharmacy. jw

## 2016-07-09 ENCOUNTER — Telehealth: Payer: Self-pay | Admitting: *Deleted

## 2016-07-09 NOTE — Telephone Encounter (Signed)
Received voice mail from [facility] requesting verbal order for One Touch Ultra test strips, lancets and lancet device.  Please call [phone]. Clovis Pu, RN

## 2016-07-10 DIAGNOSIS — E118 Type 2 diabetes mellitus with unspecified complications: Secondary | ICD-10-CM | POA: Diagnosis not present

## 2016-07-10 NOTE — Telephone Encounter (Signed)
Called and release order for strips, lancets, and lancet device

## 2016-07-16 ENCOUNTER — Other Ambulatory Visit: Payer: Self-pay | Admitting: Family Medicine

## 2016-07-16 DIAGNOSIS — Z1231 Encounter for screening mammogram for malignant neoplasm of breast: Secondary | ICD-10-CM

## 2016-07-24 ENCOUNTER — Ambulatory Visit: Payer: Self-pay

## 2016-07-24 ENCOUNTER — Ambulatory Visit
Admission: RE | Admit: 2016-07-24 | Discharge: 2016-07-24 | Disposition: A | Discharge status: Home / Self Care | Payer: Self-pay | Source: Ambulatory Visit | Attending: Family Medicine | Admitting: Family Medicine

## 2016-07-24 DIAGNOSIS — Z1231 Encounter for screening mammogram for malignant neoplasm of breast: Secondary | ICD-10-CM

## 2016-08-25 NOTE — Progress Notes (Signed)
   [facility] Phone: [phone]   Date of Visit: 08/26/2016   HPI:  Pamela [name] is a 65 y.o. female presenting to clinic today for same day appointment. PCP: Danella MaiersAsiyah Z Mikell, MD Concerns today include: sore throat  - sore throat for the past week, intermittently - reports only on the right side; seems to be more sore at night but not as sore during the day  - symptoms not worsening; stable - no fevers - feels like her mouth stays dry; reports of dry cough intermittently at night. This has been going on for a few months.  - no sob, no postnasal drip, no rhinorrhea or congestion or sinus issues; no neck swelling or changes in voice; no ear pain  - salted water gargling helps a little  - sneezing since last; no itchy nose; no watery or itchy eyes - no sick contacts  - normal PO intake  - no history of smoking; chewing tobacco history "years ago"; last used over 10 years ago; used for about 40 years.  - had two teeth pulled in the front last Monday   ROS: See HPI.  PHYSICAL EXAM: BP 130/67   Pulse 92   Temp 98 F (36.7 C) (Oral)   Wt 117 lb (53.1 kg)   BMI 20.08 kg/m  GEN: NAD, pleasant, non-toxic appearing HEENT: no erythema of sclerae, no eye drainage, Nasal turbinates normal, OP with minimal erythema, tonsils without exudates. No tenderness of sinuses.Neck supple, no cervical lymphadenopathy.  CV: RRR, no murmurs, rubs, or gallops PULM: CTAB, normal effort SKIN: No rash or cyanosis; warm and well-perfused PSYCH: Mood and affect euthymic, normal rate and volume of speech  ASSESSMENT/PLAN:  Viral Pharyngitis: Symptoms likely viral pharyngitis. Considered seasonal allergies as well but does not have many symptoms supporting this.  - symptomatic treatment - return to clinic if symptoms worsen or if symptoms do not resolve in 1-2 weeks.  - Flu vaccine today   Palma HolterKanishka G Gunadasa, MD PGY 2 [facility]

## 2016-08-26 ENCOUNTER — Encounter: Payer: Self-pay | Admitting: Internal Medicine

## 2016-08-26 ENCOUNTER — Ambulatory Visit (INDEPENDENT_AMBULATORY_CARE_PROVIDER_SITE_OTHER): Payer: Medicare HMO | Admitting: Internal Medicine

## 2016-08-26 VITALS — BP 130/67 | HR 92 | Temp 98.0°F | Wt 117.0 lb

## 2016-08-26 DIAGNOSIS — E119 Type 2 diabetes mellitus without complications: Secondary | ICD-10-CM

## 2016-08-26 DIAGNOSIS — Z23 Encounter for immunization: Secondary | ICD-10-CM

## 2016-08-26 LAB — POCT GLYCOSYLATED HEMOGLOBIN (HGB A1C): Hemoglobin A1C: 6

## 2016-08-26 NOTE — Patient Instructions (Signed)
I believe you likely have viral pharyngitis (sore throat caused by virus). Your symptoms should resolve in the next week or so. Please return to clinic if your symptoms worsen or if they resolve in a week or 2.

## 2016-09-08 ENCOUNTER — Other Ambulatory Visit: Payer: Self-pay | Admitting: Internal Medicine

## 2016-09-11 ENCOUNTER — Other Ambulatory Visit: Payer: Self-pay | Admitting: Internal Medicine

## 2016-10-13 DIAGNOSIS — H1851 Endothelial corneal dystrophy: Secondary | ICD-10-CM | POA: Diagnosis not present

## 2016-10-13 DIAGNOSIS — H40013 Open angle with borderline findings, low risk, bilateral: Secondary | ICD-10-CM | POA: Diagnosis not present

## 2016-10-13 DIAGNOSIS — E119 Type 2 diabetes mellitus without complications: Secondary | ICD-10-CM | POA: Diagnosis not present

## 2016-10-13 DIAGNOSIS — H2513 Age-related nuclear cataract, bilateral: Secondary | ICD-10-CM | POA: Diagnosis not present

## 2016-10-13 LAB — HM DIABETES EYE EXAM

## 2016-11-19 DIAGNOSIS — Z0000 Encounter for general adult medical examination without abnormal findings: Secondary | ICD-10-CM | POA: Diagnosis not present

## 2016-11-19 DIAGNOSIS — Z6820 Body mass index (BMI) 20.0-20.9, adult: Secondary | ICD-10-CM | POA: Diagnosis not present

## 2016-11-19 DIAGNOSIS — E785 Hyperlipidemia, unspecified: Secondary | ICD-10-CM | POA: Diagnosis not present

## 2016-11-19 DIAGNOSIS — I10 Essential (primary) hypertension: Secondary | ICD-10-CM | POA: Diagnosis not present

## 2016-11-19 DIAGNOSIS — E119 Type 2 diabetes mellitus without complications: Secondary | ICD-10-CM | POA: Diagnosis not present

## 2016-11-19 DIAGNOSIS — K219 Gastro-esophageal reflux disease without esophagitis: Secondary | ICD-10-CM | POA: Diagnosis not present

## 2017-01-15 ENCOUNTER — Other Ambulatory Visit: Payer: Self-pay | Admitting: Internal Medicine

## 2017-01-21 ENCOUNTER — Ambulatory Visit (INDEPENDENT_AMBULATORY_CARE_PROVIDER_SITE_OTHER): Payer: Medicare HMO | Admitting: Internal Medicine

## 2017-01-21 ENCOUNTER — Encounter: Payer: Self-pay | Admitting: Internal Medicine

## 2017-01-21 VITALS — BP 125/80 | HR 97 | Temp 98.0°F | Ht 64.0 in | Wt 116.2 lb

## 2017-01-21 DIAGNOSIS — I10 Essential (primary) hypertension: Secondary | ICD-10-CM

## 2017-01-21 DIAGNOSIS — G44209 Tension-type headache, unspecified, not intractable: Secondary | ICD-10-CM | POA: Diagnosis not present

## 2017-01-21 DIAGNOSIS — E119 Type 2 diabetes mellitus without complications: Secondary | ICD-10-CM | POA: Diagnosis not present

## 2017-01-21 LAB — BASIC METABOLIC PANEL WITH GFR
BUN: 18 mg/dL (ref 7–25)
CO2: 29 mmol/L (ref 20–31)
Calcium: 9.7 mg/dL (ref 8.6–10.4)
Chloride: 100 mmol/L (ref 98–110)
Creat: 1.1 mg/dL — ABNORMAL HIGH (ref 0.50–0.99)
GFR, EST AFRICAN AMERICAN: 61 mL/min (ref >=60)
GFR, EST NON AFRICAN AMERICAN: 53 mL/min — AB (ref >=60)
Glucose, Bld: 151 mg/dL — ABNORMAL HIGH (ref 65–99)
POTASSIUM: 4.4 mmol/L (ref 3.5–5.3)
Sodium: 138 mmol/L (ref 135–146)

## 2017-01-21 LAB — POCT GLYCOSYLATED HEMOGLOBIN (HGB A1C): HEMOGLOBIN A1C: 6.1

## 2017-01-21 MED ORDER — LISINOPRIL-HYDROCHLOROTHIAZIDE 20-25 MG PO TABS: 1.0000 | ORAL_TABLET | Freq: Every day | ORAL | 3 refills | Status: DC

## 2017-01-21 MED ORDER — METFORMIN HCL 500 MG PO TABS: 500.0000 mg | ORAL_TABLET | Freq: Every day | ORAL | 3 refills | Status: DC

## 2017-01-21 NOTE — Patient Instructions (Addendum)
You need to make sure you have calcium, vitamin D and B12 - Women's vitamin Headache diary  - please right down time and duration Follow up in two weeks     Tension Headache A tension headache is a feeling of pain, pressure, or aching that is often felt over the front and sides of the head. The pain can be dull, or it can feel tight (constricting). Tension headaches are not normally associated with nausea or vomiting, and they do not get worse with physical activity. Tension headaches can last from 30 minutes to several days. This is the most common type of headache. CAUSES The exact cause of this condition is not known. Tension headaches often begin after stress, anxiety, or depression. Other triggers may include:  Alcohol.  Too much caffeine, or caffeine withdrawal.  Respiratory infections, such as colds, flu, or sinus infections.  Dental problems or teeth clenching.  Fatigue.  Holding your head and neck in the same position for a long period of time, such as while using a computer.  Smoking. SYMPTOMS Symptoms of this condition include:  A feeling of pressure around the head.  Dull, aching head pain.  Pain felt over the front and sides of the head.  Tenderness in the muscles of the head, neck, and shoulders. DIAGNOSIS This condition may be diagnosed based on your symptoms and a physical exam. Tests may be done, such as a CT scan or an MRI of your head. These tests may be done if your symptoms are severe or unusual. TREATMENT This condition may be treated with lifestyle changes and medicines to help relieve symptoms. HOME CARE INSTRUCTIONS Managing Pain   Take over-the-counter and prescription medicines only as told by your health care provider.  Lie down in a dark, quiet room when you have a headache.  If directed, apply ice to the head and neck area:  Put ice in a plastic bag.  Place a towel between your skin and the bag.  Leave the ice on for 20 minutes, 2-3  times per day.  Use a heating pad or a hot shower to apply heat to the head and neck area as told by your health care provider. Eating and Drinking   Eat meals on a regular schedule.  Limit alcohol use.  Decrease your caffeine intake, or stop using caffeine. General Instructions   Keep all follow-up visits as told by your health care provider. This is important.  Keep a headache journal to help find out what may trigger your headaches. For example, write down:  What you eat and drink.  How much sleep you get.  Any change to your diet or medicines.  Try massage or other relaxation techniques.  Limit stress.  Sit up straight, and avoid tensing your muscles.  Do not use tobacco products, including cigarettes, chewing tobacco, or e-cigarettes. If you need help quitting, ask your health care provider.  Exercise regularly as told by your health care provider.  Get 7-9 hours of sleep, or the amount recommended by your health care provider. SEEK MEDICAL CARE IF:  Your symptoms are not helped by medicine.  You have a headache that is different from what you normally experience.  You have nausea or you vomit.  You have a fever. SEEK IMMEDIATE MEDICAL CARE IF:  Your headache becomes severe.  You have repeated vomiting.  You have a stiff neck.  You have a loss of vision.  You have problems with speech.  You have pain in your  eye or ear.  You have muscular weakness or loss of muscle control.  You lose your balance or you have trouble walking.  You feel faint or you pass out.  You have confusion. This information is not intended to replace advice given to you by your health care provider. Make sure you discuss any questions you have with your health care provider. Document Released: 11/24/2005 Document Revised: 08/15/2015 Document Reviewed: 03/19/2015 Elsevier Interactive Patient Education  2017 [employer].

## 2017-01-21 NOTE — Progress Notes (Signed)
   [facility] Noralee CharsAsiyah Rylend Pietrzak, MD Phone: [phone]  Reason For Visit: Follow up T2DM, HTN and Headaches  CHRONIC HTN: Reports well controlled  Current Meds - Prinzide daily  Reports good compliance, took meds today. Tolerating well, w/o complaints. Denies CP, dyspnea, HA, edema, dizziness / lightheadedness  CHRONIC DM, Type 2: Reports no concerns Meds: Metformin, well controlled  Reports good compliance. Tolerating well w/o side-effects Currently on ACEi / ARB  Any hypoglycemia episodes: Denies palpations, diaphoresis, fatigue, weakness, jittery Denies polyuria, visual changes, numbness or tingling.  Headache  About two weeks of headache Every other day has been having headache for about two weeks  Headaches are not worsening Patient has a hx of headaches which is similar to previous hx of headaches.  Bilateral frontal headache  Often gets headaches around midday or at night  Has not been getting a lot of sleep lately  Drink plenty of fluids No recent congestion or allergies  No nausea or vomiting  tylenol resolves the headaches  No fevers, chills, weight loss,  No confusion, impaired alertness or consciousness, focal neurologic symptoms  Hx of headaches, therefore not new onset, documented previously, patient has had episodes similar to this headache in the past  No recent hx of head trauma Headache does not  awakens her from sleep   Past Medical History Reviewed problem list.  Medications- reviewed and updated No additions to family history Social history- patient is a  non smoker  Objective: BP 125/80 (BP Location: Left Arm, Patient Position: Sitting, Cuff Size: Normal)   Pulse 97   Temp 98 F (36.7 C) (Oral)   Ht 5\' 4"  (1.626 m)   Wt 116 lb 3.2 oz (52.7 kg)   SpO2 99%   BMI 19.95 kg/m  Gen: NAD, alert, cooperative with exam Cardio: regular rate and rhythm, S1S2 heard, no murmurs appreciated Pulm: clear to auscultation bilaterally, no  wheezes, rhonchi or rales Neuro: Cn 2-7 intact,  Strength equal & normal in upper & lower extremities Able to walk on heels and toes.   Balance normal  Finger to nose normal   Diabetic Foot Exam - Simple   Simple Foot Form Diabetic Foot exam was performed with the following findings:  Yes 01/21/2017  2:00 PM  Visual Inspection No deformities, no ulcerations, no other skin breakdown bilaterally:  Yes Sensation Testing Intact to touch and monofilament testing bilaterally:  Yes Pulse Check Posterior Tibialis and Dorsalis pulse intact bilaterally:  Yes Comments      Assessment/Plan: See problem based a/p   HYPERTENSION, BENIGN SYSTEMIC Controlled Continue Prinzide  Obtain BMET   Headache Headache, likely tension. No red flags. Normal neurologic exam  - Will have patient follow up in two weeks with headache diary  - Discussed the importance of sleep and fluids  - If patient continues have headaches at increased frequency would consider imaging as she is older   Controlled diabetes mellitus type II without complication Controlled, A1C 6.1 Continue metformin  Diabetic foot exam wnl  Continue ACE

## 2017-01-26 NOTE — Assessment & Plan Note (Signed)
Controlled Continue Prinzide  Obtain BMET

## 2017-01-26 NOTE — Assessment & Plan Note (Addendum)
Controlled, A1C 6.1 Continue metformin  Diabetic foot exam wnl  Continue ACE

## 2017-01-26 NOTE — Assessment & Plan Note (Addendum)
Headache, likely tension. Recently getting poor sleep in part due daytime inactivity. No red flags. Normal neurologic exam  - Will have patient follow up in two weeks with headache diary  - Discussed the importance of sleep and fluids  - If patient continues have headaches at increased frequency would consider imaging as she is older than 40  - Continue tylenol as needed for the headache - Follow up in two weeks

## 2017-02-03 DIAGNOSIS — E118 Type 2 diabetes mellitus with unspecified complications: Secondary | ICD-10-CM | POA: Diagnosis not present

## 2017-02-05 ENCOUNTER — Encounter: Payer: Self-pay | Admitting: Internal Medicine

## 2017-02-09 ENCOUNTER — Encounter (HOSPITAL_COMMUNITY): Payer: Self-pay | Admitting: Emergency Medicine

## 2017-02-09 ENCOUNTER — Emergency Department (HOSPITAL_COMMUNITY): Payer: Medicare HMO

## 2017-02-09 ENCOUNTER — Emergency Department (HOSPITAL_COMMUNITY)
Admission: EM | Admit: 2017-02-09 | Discharge: 2017-02-09 | Disposition: A | Discharge status: Home / Self Care | Payer: Medicare HMO | Attending: Emergency Medicine | Admitting: Emergency Medicine

## 2017-02-09 DIAGNOSIS — E119 Type 2 diabetes mellitus without complications: Secondary | ICD-10-CM | POA: Insufficient documentation

## 2017-02-09 DIAGNOSIS — Z7984 Long term (current) use of oral hypoglycemic drugs: Secondary | ICD-10-CM | POA: Diagnosis not present

## 2017-02-09 DIAGNOSIS — I10 Essential (primary) hypertension: Secondary | ICD-10-CM | POA: Insufficient documentation

## 2017-02-09 DIAGNOSIS — Z7982 Long term (current) use of aspirin: Secondary | ICD-10-CM | POA: Diagnosis not present

## 2017-02-09 DIAGNOSIS — R51 Headache: Secondary | ICD-10-CM | POA: Diagnosis not present

## 2017-02-09 DIAGNOSIS — R519 Headache, unspecified: Secondary | ICD-10-CM

## 2017-02-09 MED ORDER — ACETAMINOPHEN 500 MG PO TABS
1000.0000 mg | ORAL_TABLET | Freq: Once | ORAL | Status: AC
  Administered 2017-02-09: 1000 mg via ORAL
  Filled 2017-02-09: qty 2

## 2017-02-09 NOTE — ED Provider Notes (Signed)
[facility]-EMERGENCY DEPT Provider Note   CSN: [id number] Arrival date & time: 02/09/17  1228     History   Chief Complaint Chief Complaint  Patient presents with  . Headache    HPI Pamela [name] is a 66 y.o. female.  Patient is a 66 year old female who presents with a headache. She states that the headache has been intermittent over the last month. She describes as a pressure/sharp pain to her left forehead. Nonradiating. She states over the last 2 days it's been waking her up from sleep at night. Last night it was intense and woke her up about 2 AM and she was able to go back to sleep. She states it's eased off a little bit now but is still present. She denies any vision changes. No photophobia. No nausea or vomiting. No numbness or weakness to her extremities. No balance problems. No history of headaches in the past. She has a history of diabetes, hypertension and hyperlipidemia.      Past Medical History:  Diagnosis Date  . Abdominal pain, left lower quadrant 07/18/2015   Left Lower Quadrant Abdominal Pain. Patient with hysterectomy in the past. No vaginal bleeding/pain. No complaints of dysuria or incontinence. UA not significant. No constipation or hematochezia. Colonoscopy in 2015 significant for diverticulosis. CT abdomen in 2016 without hernias. No significant weight loss  - Likely diverticulitis - Will start patient Flagyl 100 mg TID and Ciprofloxacin 100 mg   . Anemia   . Anxiety    passing of husband  . Diabetes mellitus without complication (HCC)    metformin  . GERD (gastroesophageal reflux disease)   . Hypercholesteremia   . Hypertension     Patient Active Problem List   Diagnosis Date Noted  . Appetite impaired 10/02/2015  . Diverticulosis 05/10/2015  . Palpitations 05/10/2015  . GERD (gastroesophageal reflux disease) 01/15/2015  . Headache 07/25/2014  . Rectal bleeding 11/09/2013  . Acute blood loss anemia 09/29/2013  . Nausea and vomiting 09/20/2013  . LLQ  abdominal pain 09/20/2013  . Hemorrhoids 09/19/2013  . Syncope 09/18/2013  . Orthostatic hypotension 09/18/2013  . Renal insufficiency, mild 09/18/2013  . Feeling grief 09/18/2013  . Insomnia 03/07/2013  . Perimenopausal atrophic vaginitis 01/27/2012  . Counseling on health promotion and disease prevention 07/29/2011  . CONSTIPATION 12/27/2010  . WEIGHT LOSS 05/24/2010  . FATIGUE 01/23/2010  . Controlled diabetes mellitus type II without complication (HCC) 11/06/2009  . HLD (hyperlipidemia) 02/04/2007  . HYPERTENSION, BENIGN SYSTEMIC 02/04/2007  . OSTEOPENIA 02/04/2007    Past Surgical History:  Procedure Laterality Date  . PARTIAL HYSTERECTOMY      OB History    No data available       Home Medications    Prior to Admission medications   Medication Sig Start Date End Date Taking? Authorizing Provider  acetaminophen (TYLENOL) 500 MG tablet Take 1,000 mg by mouth every 6 (six) hours as needed.   Yes Historical Provider, MD  aspirin EC 81 MG tablet Take 81 mg by mouth daily.   Yes Historical Provider, MD  bisacodyl (DULCOLAX) 5 MG EC tablet Take 10 mg by mouth daily as needed for moderate constipation.   Yes Historical Provider, MD  lisinopril-hydrochlorothiazide (PRINZIDE,ZESTORETIC) 20-25 MG tablet Take 1 tablet by mouth daily. 01/21/17  Yes Asiyah Mayra Reel, MD  metFORMIN (GLUCOPHAGE) 500 MG tablet Take 1 tablet (500 mg total) by mouth daily with breakfast. 01/21/17  Yes Asiyah Mayra Reel, MD  Multiple Vitamin (MULTIVITAMIN WITH MINERALS) TABS tablet Take  1 tablet by mouth daily.   Yes Historical Provider, MD  pantoprazole (PROTONIX) 40 MG tablet take 1 tablet by mouth once daily 09/12/16  Yes Asiyah Mayra Reel, MD  pravastatin (PRAVACHOL) 20 MG tablet Take 1 tablet (20 mg total) by mouth daily. 12/21/15  Yes Asiyah Mayra Reel, MD  glucose blood test strip Check blood sugar once daily fasting 07/08/16   Asiyah Mayra Reel, MD    Family History Family History    Problem Relation Age of Onset  . Heart disease Father   . Diabetes Sister   . Diabetes Brother   . Heart disease Brother   . Hyperlipidemia Brother   . Hypertension Brother     Social History Social History  Substance Use Topics  . Smoking status: Never Smoker  . Smokeless tobacco: Former Neurosurgeon    Types: Snuff    Quit date: 12/08/1968  . Alcohol use No     Allergies   Patient has no known allergies.   Review of Systems Review of Systems  Constitutional: Negative for chills, diaphoresis, fatigue and fever.  HENT: Negative for congestion, rhinorrhea and sneezing.   Eyes: Negative.   Respiratory: Negative for cough, chest tightness and shortness of breath.   Cardiovascular: Negative for chest pain and leg swelling.  Gastrointestinal: Negative for abdominal pain, blood in stool, diarrhea, nausea and vomiting.  Genitourinary: Negative for difficulty urinating, flank pain, frequency and hematuria.  Musculoskeletal: Negative for arthralgias and back pain.  Skin: Negative for rash.  Neurological: Positive for headaches. Negative for dizziness, speech difficulty, weakness and numbness.     Physical Exam Updated Vital Signs BP 131/71 (BP Location: Left Arm)   Pulse 89   Temp 98.2 F (36.8 C) (Oral)   Resp 17   SpO2 97%   Physical Exam  Constitutional: She is oriented to person, place, and time. She appears well-developed and well-nourished.  HENT:  Head: Normocephalic and atraumatic.  Eyes: Pupils are equal, round, and reactive to light.  Neck: Normal range of motion. Neck supple.  Cardiovascular: Normal rate, regular rhythm and normal heart sounds.   Pulmonary/Chest: Effort normal and breath sounds normal. No respiratory distress. She has no wheezes. She has no rales. She exhibits no tenderness.  Abdominal: Soft. Bowel sounds are normal. There is no tenderness. There is no rebound and no guarding.  Musculoskeletal: Normal range of motion. She exhibits no edema.   Lymphadenopathy:    She has no cervical adenopathy.  Neurological: She is alert and oriented to person, place, and time.  Motor 5/5 all extremities Sensation grossly intact to LT all extremities Finger to Nose intact, no pronator drift CN II-XII grossly intact Gait normal   Skin: Skin is warm and dry. No rash noted.  Psychiatric: She has a normal mood and affect.     ED Treatments / Results  Labs (all labs ordered are listed, but only abnormal results are displayed) Labs Reviewed - No data to display  EKG  EKG Interpretation None       Radiology Ct Head Wo Contrast  Result Date: 02/09/2017 CLINICAL DATA:  Left-sided headache EXAM: CT HEAD WITHOUT CONTRAST TECHNIQUE: Contiguous axial images were obtained from the base of the skull through the vertex without intravenous contrast. COMPARISON:  02/17/2011 FINDINGS: Brain: No evidence of acute infarction, hemorrhage, hydrocephalus, extra-axial collection or mass lesion/mass effect. Vascular: No hyperdense vessel or unexpected calcification. Skull: Normal. Negative for fracture or focal lesion. Sinuses/Orbits: No acute finding. Other: None IMPRESSION: No CT evidence for  acute intracranial abnormality Electronically Signed   By: Jasmine PangKim  Fujinaga M.D.   On: 02/09/2017 17:08    Procedures Procedures (including critical care time)  Medications Ordered in ED Medications  acetaminophen (TYLENOL) tablet 1,000 mg (1,000 mg Oral Given 02/09/17 1729)     Initial Impression / Assessment and Plan / ED Course  I have reviewed the triage vital signs and the nursing notes.  Pertinent labs & imaging results that were available during my care of the patient were reviewed by me and considered in my medical decision making (see chart for details).     Patient presents with a headache. Given the fact that it has been waking her up at night, I did do a head CT to rule out mass. This was negative. She states that Tylenol has been coping with the  headaches. She didn't request any stronger pain medication. She was encouraged to have follow-up with her PCP.  Final Clinical Impressions(s) / ED Diagnoses   Final diagnoses:  Nonintractable headache, unspecified chronicity pattern, unspecified headache type    New Prescriptions New Prescriptions   No medications on file     Rolan BuccoMelanie Brodrick Curran, MD 02/09/17 1742

## 2017-02-09 NOTE — ED Triage Notes (Signed)
Patient c/o left sided headache that woke her up around 2 am. Patient denies any visual disturbance or n/v. Patient tried to get apt with PCP but unable to.

## 2017-02-11 ENCOUNTER — Telehealth: Payer: Self-pay | Admitting: Internal Medicine

## 2017-02-11 NOTE — Telephone Encounter (Signed)
Gave pt message. She doesn't think she needs an appt with the dr since she hasnt had any headaches since Saturday night. She said her CT scan showed nothing was wrong. She will call and schedule an appt if the headaches come back.

## 2017-02-11 NOTE — Telephone Encounter (Signed)
Patient  recently seen in the ED for headaches Previously seen by me earlier in February for headaches. Patient received a head CT in the ED which was negative for any acute findings. I am concerned that her headaches have not improved over the past month and are apparently waking her up at night. Called patient to have her come in and make an appointment with me for possible MRI. Patient was not available and brother answered the phone. Told brother to have patient call the clinic and make an appointment as soon as possible.

## 2017-02-23 ENCOUNTER — Ambulatory Visit (INDEPENDENT_AMBULATORY_CARE_PROVIDER_SITE_OTHER): Payer: Medicare HMO | Admitting: Internal Medicine

## 2017-02-23 ENCOUNTER — Encounter: Payer: Self-pay | Admitting: Internal Medicine

## 2017-02-23 VITALS — BP 134/76 | HR 68 | Temp 98.0°F | Wt 119.0 lb

## 2017-02-23 DIAGNOSIS — R519 Headache, unspecified: Secondary | ICD-10-CM

## 2017-02-23 DIAGNOSIS — R51 Headache: Secondary | ICD-10-CM | POA: Diagnosis not present

## 2017-02-23 MED ORDER — BUTALBITAL-APAP-CAFFEINE 50-325-40 MG PO TABS: 1.0000 | ORAL_TABLET | Freq: Four times a day (QID) | ORAL | 0 refills | Status: DC | PRN

## 2017-02-23 NOTE — Progress Notes (Signed)
   [facility] Noralee CharsAsiyah Guillermo Difrancesco, MD Phone: [phone]  Reason For Visit: F/U Headache   #Previously seen for complaint of headache. Patient was then seen in the ED on March 7 for continued headache some of which have woken her from her sleep.  Has had a previous history of headaches 1 which self resolved several years ago. Indicates headache is frontal, bilateral, in nature. States that often happens at night. However can happen during the day as well. Tylenol has helped in the past, though not always. patient recently took ibuprofen she indicates resolves the headache. No photophobia, phonophobia. No nausea vomiting, no fevers, chills. No focal abnormalities or gait changes. CT head done in the ED was negative for any acute abnormalities. Patient continues to have headaches. Most recently indicates having about 3 headaches over the last week.   Past Medical History Reviewed problem list.  Medications- reviewed and updated No additions to family history Social history- patient is a non smoker  Objective: BP 134/76   Pulse 68   Temp 98 F (36.7 C) (Oral)   Wt 119 lb (54 kg)   BMI 20.43 kg/m  Gen: NAD, alert, cooperative with exam HEENT: Normal    Neck: No masses palpated. No lymphadenopathy    Ears: Tympanic membranes intact, normal light reflex, no erythema, no bulging    Eyes: PERRLA, EOMI    Nose: nasal turbinates moist    Throat: moist mucus membranes, no erythema Cardio: regular rate and rhythm, S1S2 heard, no murmurs appreciated Neuro: Neurologic exam : Cn 2-7 intact Strength equal & normal in upper & lower extremities Able to walk on heels and toes.   Balance normal   finger to nose    Assessment/Plan: See problem based a/p  Headache Chronic headache for about one month which is new onset. CT head was negative in ED. Will get an MRI to rule out any masses not imaged on CT. Will prescribe patient Fioricet for headaches. If patient continues to  have headaches 2-3 times a week will consider a controller medication to take daily.

## 2017-02-23 NOTE — Patient Instructions (Addendum)
I will provide you with a medication you can use for break through headaches. First try tylenol and see if this works. I also recommend you get an MRI to determine to make sure everything is neurologically intact. Follow up in 2 weeks if headaches do not improve

## 2017-02-23 NOTE — Assessment & Plan Note (Addendum)
Chronic headache for about one month which is new onset. CT head was negative in ED. Will get an MRI to rule out any masses not imaged on CT. Will prescribe patient Fioricet for headaches. If patient continues to have headaches 2-3 times a week will consider a controller medication to take daily. Follow up in two weeks

## 2017-02-25 ENCOUNTER — Other Ambulatory Visit: Payer: Self-pay | Admitting: Internal Medicine

## 2017-02-25 MED ORDER — PRAVASTATIN SODIUM 20 MG PO TABS: 20.0000 mg | ORAL_TABLET | Freq: Every day | ORAL | 3 refills | Status: DC

## 2017-02-25 NOTE — Telephone Encounter (Signed)
Pt  calling to request refill of:  Name of Medication(s):  Pravastatin  Last date of OV:  02-23-17 Pharmacy:  [facility] [address]  Will route refill request to Clinic RN.  Discussed with patient policy to call pharmacy for future refills.  Also, discussed refills may take up to 48 hours to approve or deny.  Markus JarvisEmily C Pittman

## 2017-02-27 ENCOUNTER — Ambulatory Visit (HOSPITAL_COMMUNITY): Admission: RE | Admit: 2017-02-27 | Payer: Medicare HMO | Source: Ambulatory Visit

## 2017-03-09 ENCOUNTER — Ambulatory Visit (HOSPITAL_COMMUNITY)
Admission: RE | Admit: 2017-03-09 | Discharge: 2017-03-09 | Disposition: A | Discharge status: Home / Self Care | Payer: Medicare HMO | Source: Ambulatory Visit | Attending: Family Medicine | Admitting: Family Medicine

## 2017-03-09 DIAGNOSIS — R51 Headache: Secondary | ICD-10-CM

## 2017-03-09 DIAGNOSIS — R519 Headache, unspecified: Secondary | ICD-10-CM

## 2017-03-09 DIAGNOSIS — H61892 Other specified disorders of left external ear: Secondary | ICD-10-CM | POA: Insufficient documentation

## 2017-03-12 ENCOUNTER — Telehealth: Payer: Self-pay | Admitting: Internal Medicine

## 2017-03-12 DIAGNOSIS — R519 Headache, unspecified: Secondary | ICD-10-CM

## 2017-03-12 DIAGNOSIS — R51 Headache: Principal | ICD-10-CM

## 2017-03-12 NOTE — Telephone Encounter (Signed)
Called on-call ENT Dr. 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 Patient was evaluated by me for headaches, new onset and it was deemed necessary for her to have an MRI. Dr. Lazarus Salines stated that patient could benefit from surgical procedure, he indicated that Dr. Ignacia Felling at [facility] would be the best person to do this surgical procedure. He indicated that there was no further need for imaging. Patient could be seen in next week or so as this was a slow growing tumor.  Discussed plan with patient, she states that she would be willing to go to [facility] to have this evaluated.

## 2017-03-12 NOTE — Telephone Encounter (Signed)
See previous note

## 2017-03-12 NOTE — Addendum Note (Signed)
Addended by: [name] Z on: 03/12/2017 12:26 PM   Modules accepted: Orders

## 2017-03-13 ENCOUNTER — Telehealth: Payer: Self-pay | Admitting: Internal Medicine

## 2017-03-13 NOTE — Telephone Encounter (Signed)
Pt called and would like to speak to her doctor about some test that she had done in [facility]. jw

## 2017-03-16 ENCOUNTER — Telehealth: Payer: Self-pay | Admitting: *Deleted

## 2017-03-16 NOTE — Telephone Encounter (Signed)
Prior Authorization received from [facility] for Butalbital-APAP caffeine.  PA form placed in provider box for completion. Clovis Pu, RN

## 2017-03-19 NOTE — Telephone Encounter (Signed)
PA for butalbital-Acetaminophen 325-40 approved via [employer] until 12/07/17.  Clovis Pu, RN

## 2017-03-19 NOTE — Telephone Encounter (Signed)
PA for [facility] faxed to [facility] for review.  Review process could take 24-72 hours to complete.  Clovis Pu, RN

## 2017-04-06 DIAGNOSIS — H7192 Unspecified cholesteatoma, left ear: Secondary | ICD-10-CM | POA: Diagnosis not present

## 2017-04-06 DIAGNOSIS — H9190 Unspecified hearing loss, unspecified ear: Secondary | ICD-10-CM | POA: Diagnosis not present

## 2017-04-06 DIAGNOSIS — R51 Headache: Secondary | ICD-10-CM | POA: Diagnosis not present

## 2017-05-01 ENCOUNTER — Telehealth: Payer: Self-pay | Admitting: Internal Medicine

## 2017-05-01 ENCOUNTER — Ambulatory Visit: Payer: Medicare HMO | Admitting: Internal Medicine

## 2017-05-01 NOTE — Telephone Encounter (Signed)
Pt has a lot of gas and the OTC medicines are not working. Could dr just call something in[facility] [address]

## 2017-05-07 ENCOUNTER — Telehealth: Payer: Self-pay | Admitting: Internal Medicine

## 2017-05-07 NOTE — Telephone Encounter (Signed)
Pt was advised. ep °

## 2017-05-07 NOTE — Telephone Encounter (Signed)
Called patient twice to discuss issues with gas, however no answer. OTC medications like gas-x are available. Patient can also try miralax/colace which help her have bowel movements and therefore her gas.

## 2017-05-07 NOTE — Telephone Encounter (Signed)
Pt hasnt heard anything from the dr. She is still having problems with gas and OTC meds arent working. Please advise

## 2017-05-25 ENCOUNTER — Ambulatory Visit (INDEPENDENT_AMBULATORY_CARE_PROVIDER_SITE_OTHER): Payer: Medicare HMO | Admitting: Internal Medicine

## 2017-05-25 ENCOUNTER — Encounter: Payer: Self-pay | Admitting: Internal Medicine

## 2017-05-25 VITALS — BP 130/76 | HR 74 | Temp 97.7°F | Ht 64.0 in | Wt 114.0 lb

## 2017-05-25 DIAGNOSIS — E119 Type 2 diabetes mellitus without complications: Secondary | ICD-10-CM | POA: Diagnosis not present

## 2017-05-25 DIAGNOSIS — E785 Hyperlipidemia, unspecified: Secondary | ICD-10-CM

## 2017-05-25 DIAGNOSIS — Z7189 Other specified counseling: Secondary | ICD-10-CM

## 2017-05-25 DIAGNOSIS — M949 Disorder of cartilage, unspecified: Secondary | ICD-10-CM | POA: Diagnosis not present

## 2017-05-25 DIAGNOSIS — Z23 Encounter for immunization: Secondary | ICD-10-CM | POA: Diagnosis not present

## 2017-05-25 DIAGNOSIS — I10 Essential (primary) hypertension: Secondary | ICD-10-CM | POA: Diagnosis not present

## 2017-05-25 DIAGNOSIS — M899 Disorder of bone, unspecified: Secondary | ICD-10-CM

## 2017-05-25 DIAGNOSIS — M8580 Other specified disorders of bone density and structure, unspecified site: Secondary | ICD-10-CM

## 2017-05-25 LAB — POCT GLYCOSYLATED HEMOGLOBIN (HGB A1C): Hemoglobin A1C: 6

## 2017-05-25 NOTE — Assessment & Plan Note (Addendum)
Needs repeat Dexa scan  Vitamin D level  Hx of osteoporosis  Needs to be evaluated for health maintenance visit

## 2017-05-25 NOTE — Progress Notes (Signed)
   [facility] Pamela CharsAsiyah Psalm Schappell, MD Phone: [phone]  Reason For Visit: F/U T2DM, HTN, HLD  # CHRONIC DM, Type 2: Metformin 500 mg daily  Reports good compliance. Tolerating well w/o side-effects Currently on ACEi / ARB  Lifestyle: Exercise - going to gym and exercises Any hypoglycemia episodes: Denies palpations, diaphoresis, fatigue, weakness, jittery Denies polyuria, visual changes, numbness or tingling.   CHRONIC HTN: Reports  Current Meds - HCTZ -lisinopril  Reports good compliance, took meds today. Tolerating well, w/o complaints. Lifestyle - exercise  Denies CP, dyspnea, HA, edema, dizziness / lightheadedness  HYPERLIPIDEMIA Disease Monitoring: Pravastatin and Aspirin  See symptoms for Hypertension Medications:  Compliance-yes  Right upper quadrant pain- None  Muscle aches- none    Past Medical History Reviewed problem list.  Medications- reviewed and updated No additions to family history Social history- patient is a non-smoker  Objective: BP 130/76   Pulse 74   Temp 97.7 F (36.5 C) (Oral)   Ht 5\' 4"  (1.626 m)   Wt 114 lb (51.7 kg)   SpO2 98%   BMI 19.57 kg/m  Gen: NAD, alert, cooperative with exam Cardio: regular rate and rhythm, S1S2 heard, no murmurs appreciated Pulm: clear to auscultation bilaterally, no wheezes, rhonchi or rales GI: soft, non-tender, non-distended, bowel sounds present, no hepatomegaly, no splenomegaly Skin: dry, intact, no rashes or lesions   Assessment/Plan: See problem based a/p  Controlled diabetes mellitus type II without complication (HCC) A1C 6.0, Continue Metformin 500 mg daily, doing well  HYPERTENSION, BENIGN SYSTEMIC Initial blood pressure elevated, recheck within patient's normal limits  - Continue patient's regiment of Lisnopril-HCTZ  - F/U CMET and CBC  - Follow up in 1-2 months - Check blood pressures daily at home to ensure good control   HLD (hyperlipidemia) No issues - continue  pravastatin and aspirin  - CMET    Counseling on health promotion and disease prevention Needs repeat Dexa scan  Vitamin D level  Hx of osteoporosis  Needs to be evaluated for health maintenance visit

## 2017-05-25 NOTE — Assessment & Plan Note (Addendum)
Initial blood pressure elevated, recheck within patient's normal limits  - Continue patient's regiment of Lisnopril-HCTZ  - F/U CMET and CBC  - Follow up in 1-2 months - Check blood pressures daily at home to ensure good control

## 2017-05-25 NOTE — Patient Instructions (Addendum)
I want you to check her blood pressures at home to make sure they are under 130 systolic. You can follow up with me in the next 1-2 months.  You will need to do some blood work today. We will want to get a scan of your bones

## 2017-05-25 NOTE — Addendum Note (Signed)
Addended by: Garen GramsBENTON, ASHA F on: 05/25/2017 02:38 PM   Modules accepted: Orders

## 2017-05-25 NOTE — Assessment & Plan Note (Signed)
A1C 6.0, Continue Metformin 500 mg daily, doing well

## 2017-05-25 NOTE — Assessment & Plan Note (Signed)
No issues - continue pravastatin and aspirin  - CMET

## 2017-05-26 LAB — CBC
HEMOGLOBIN: 11.7 g/dL (ref 11.1–15.9)
Hematocrit: 35.3 % (ref 34.0–46.6)
MCH: 33.1 pg — AB (ref 26.6–33.0)
MCHC: 33.1 g/dL (ref 31.5–35.7)
MCV: 100 fL — AB (ref 79–97)
Platelets: 284 10*3/uL (ref 150–379)
RBC: 3.53 x10E6/uL — AB (ref 3.77–5.28)
RDW: 13.1 % (ref 12.3–15.4)
WBC: 6.9 10*3/uL (ref 3.4–10.8)

## 2017-05-26 LAB — CMP14+EGFR
A/G RATIO: 1.5 (ref 1.2–2.2)
ALT: 10 IU/L (ref 0–32)
AST: 16 IU/L (ref 0–40)
Albumin: 4.5 g/dL (ref 3.6–4.8)
Alkaline Phosphatase: 78 IU/L (ref 39–117)
BUN/Creatinine Ratio: 14 (ref 12–28)
BUN: 14 mg/dL (ref 8–27)
Bilirubin Total: 0.7 mg/dL (ref 0.0–1.2)
CALCIUM: 9.8 mg/dL (ref 8.7–10.3)
CO2: 26 mmol/L (ref 20–29)
CREATININE: 1.01 mg/dL — AB (ref 0.57–1.00)
Chloride: 99 mmol/L (ref 96–106)
GFR, EST AFRICAN AMERICAN: 68 mL/min/{1.73_m2} (ref >59)
GFR, EST NON AFRICAN AMERICAN: 59 mL/min/{1.73_m2} — AB (ref >59)
Globulin, Total: 3 g/dL (ref 1.5–4.5)
Glucose: 129 mg/dL — ABNORMAL HIGH (ref 65–99)
POTASSIUM: 4.3 mmol/L (ref 3.5–5.2)
Sodium: 140 mmol/L (ref 134–144)
TOTAL PROTEIN: 7.5 g/dL (ref 6.0–8.5)

## 2017-05-26 LAB — VITAMIN D 25 HYDROXY (VIT D DEFICIENCY, FRACTURES): VIT D 25 HYDROXY: 41.1 ng/mL (ref 30.0–100.0)

## 2017-05-27 ENCOUNTER — Encounter: Payer: Self-pay | Admitting: Internal Medicine

## 2017-06-03 DIAGNOSIS — E118 Type 2 diabetes mellitus with unspecified complications: Secondary | ICD-10-CM | POA: Diagnosis not present

## 2017-06-18 ENCOUNTER — Other Ambulatory Visit: Payer: Self-pay | Admitting: Family Medicine

## 2017-06-18 ENCOUNTER — Other Ambulatory Visit: Payer: Self-pay | Admitting: Internal Medicine

## 2017-06-18 DIAGNOSIS — Z1231 Encounter for screening mammogram for malignant neoplasm of breast: Secondary | ICD-10-CM

## 2017-07-27 ENCOUNTER — Inpatient Hospital Stay: Admission: RE | Admit: 2017-07-27 | Payer: Medicare HMO | Source: Ambulatory Visit

## 2017-07-27 ENCOUNTER — Ambulatory Visit: Payer: Medicare HMO

## 2017-08-06 ENCOUNTER — Ambulatory Visit
Admission: RE | Admit: 2017-08-06 | Discharge: 2017-08-06 | Disposition: A | Discharge status: Home / Self Care | Payer: Medicare HMO | Source: Ambulatory Visit | Attending: Family Medicine | Admitting: Family Medicine

## 2017-08-06 ENCOUNTER — Telehealth: Payer: Self-pay | Admitting: Internal Medicine

## 2017-08-06 DIAGNOSIS — M810 Age-related osteoporosis without current pathological fracture: Secondary | ICD-10-CM | POA: Diagnosis not present

## 2017-08-06 DIAGNOSIS — Z780 Asymptomatic menopausal state: Secondary | ICD-10-CM | POA: Diagnosis not present

## 2017-08-06 DIAGNOSIS — M8580 Other specified disorders of bone density and structure, unspecified site: Secondary | ICD-10-CM

## 2017-08-06 DIAGNOSIS — Z1231 Encounter for screening mammogram for malignant neoplasm of breast: Secondary | ICD-10-CM

## 2017-08-06 NOTE — Telephone Encounter (Signed)
Covering Dr. Aris GeorgiaMikell's inbox while she is out of town. Received Ms. Seymore's dexa scan results, which showed a new diagnosis of osteoporosis. I called patient to let her know. She voiced understanding. I have scheduled an appointment for patient to see Dr. Cathlean CowerMikell on 9/4 at 1:50pm to discuss further steps. Patient also had a normal mammogram and I told her the result over the phone.  Willadean CarolKaty Mayo, MD PGY-3

## 2017-08-11 ENCOUNTER — Encounter: Payer: Self-pay | Admitting: Internal Medicine

## 2017-08-11 ENCOUNTER — Ambulatory Visit (INDEPENDENT_AMBULATORY_CARE_PROVIDER_SITE_OTHER): Payer: Medicare HMO | Admitting: Internal Medicine

## 2017-08-11 VITALS — BP 116/58 | HR 80 | Temp 98.4°F | Ht 64.0 in | Wt 116.8 lb

## 2017-08-11 DIAGNOSIS — M899 Disorder of bone, unspecified: Secondary | ICD-10-CM

## 2017-08-11 DIAGNOSIS — Z23 Encounter for immunization: Secondary | ICD-10-CM | POA: Diagnosis not present

## 2017-08-11 DIAGNOSIS — M949 Disorder of cartilage, unspecified: Secondary | ICD-10-CM

## 2017-08-11 DIAGNOSIS — M810 Age-related osteoporosis without current pathological fracture: Secondary | ICD-10-CM

## 2017-08-11 MED ORDER — ALENDRONATE SODIUM 70 MG PO TABS: 70.0000 mg | ORAL_TABLET | ORAL | 3 refills | Status: DC

## 2017-08-11 MED ORDER — CALCIUM CARB-CHOLECALCIFEROL 1000-800 MG-UNIT PO TABS: 1.0000 | ORAL_TABLET | Freq: Every day | ORAL | 3 refills | Status: DC

## 2017-08-11 NOTE — Patient Instructions (Signed)
You will need to take Fosamax once weekly. Make sure that you take it with a full glass of water and sit up for 30 minutes. Continue also taking calcium and vitamin D daily as well.

## 2017-08-11 NOTE — Progress Notes (Signed)
   [facility] Pamela CharsAsiyah Shalom Mcguiness, MD Phone: [phone]  Reason For Visit: F/U for Positive Bone Density Scan   #Positive diagnosis for Osteoporosis  - Discussed results of bone density results. Patient has been exercise about 3 times weekly. She denies any hx of fractures. She has always been relatively thin. Has never smoked. Does not drink   Past Medical History Reviewed problem list.  Medications- reviewed and updated No additions to family history Social history- patient is a non-smoker  Objective: BP (!) 116/58   Pulse 80   Temp 98.4 F (36.9 C) (Oral)   Ht 5\' 4"  (1.626 m)   Wt 116 lb 12.8 oz (53 kg)   SpO2 99%   BMI 20.05 kg/m  Gen: NAD, alert, cooperative with exam Skin: dry, intact, no rashes or lesions Neuro: Strength and sensation grossly intact  Assessment/Plan: See problem based a/p  Osteoporosis Positive Bone Density test  1. Disorder of bone and cartilage - alendronate (FOSAMAX) 70 MG tablet; Take 1 tablet (70 mg total) by mouth once a week. Take with a full glass of water on an empty stomach.  Dispense: 12 tablet; Refill: 3  Explained patient to sit up for 30 minutes afterwards - Calcium Carb-Cholecalciferol (CALCIUM 1000 + D) 1000-800 MG-UNIT TABS; Take 1 tablet by mouth daily.  Dispense: 90 tablet; Refill: 3 - Patient will likely need to BMD testing in two years - August 2020 to make sure BMD is improving  - Discussed the importance of  Weight bearing exercise 3 times weekly

## 2017-08-12 DIAGNOSIS — M810 Age-related osteoporosis without current pathological fracture: Secondary | ICD-10-CM | POA: Insufficient documentation

## 2017-08-12 NOTE — Assessment & Plan Note (Signed)
Positive Bone Density test  1. Disorder of bone and cartilage - alendronate (FOSAMAX) 70 MG tablet; Take 1 tablet (70 mg total) by mouth once a week. Take with a full glass of water on an empty stomach.  Dispense: 12 tablet; Refill: 3  Explained patient to sit up for 30 minutes afterwards - Calcium Carb-Cholecalciferol (CALCIUM 1000 + D) 1000-800 MG-UNIT TABS; Take 1 tablet by mouth daily.  Dispense: 90 tablet; Refill: 3 - Patient will likely need to BMD testing in two years - August 2020 to make sure BMD is improving  - Discussed the importance of  Weight bearing exercise 3 times weekly

## 2017-09-17 ENCOUNTER — Other Ambulatory Visit: Payer: Self-pay | Admitting: Internal Medicine

## 2017-09-17 MED ORDER — PANTOPRAZOLE SODIUM 40 MG PO TBEC: 40.0000 mg | DELAYED_RELEASE_TABLET | Freq: Every day | ORAL | 3 refills | Status: DC

## 2017-10-19 DIAGNOSIS — H1851 Endothelial corneal dystrophy: Secondary | ICD-10-CM | POA: Diagnosis not present

## 2017-10-19 DIAGNOSIS — H2513 Age-related nuclear cataract, bilateral: Secondary | ICD-10-CM | POA: Diagnosis not present

## 2017-10-19 DIAGNOSIS — E119 Type 2 diabetes mellitus without complications: Secondary | ICD-10-CM | POA: Diagnosis not present

## 2017-10-19 DIAGNOSIS — H40013 Open angle with borderline findings, low risk, bilateral: Secondary | ICD-10-CM | POA: Diagnosis not present

## 2017-10-19 LAB — HM DIABETES EYE EXAM

## 2017-10-27 ENCOUNTER — Other Ambulatory Visit: Payer: Self-pay

## 2017-10-27 ENCOUNTER — Ambulatory Visit (INDEPENDENT_AMBULATORY_CARE_PROVIDER_SITE_OTHER): Payer: Medicare HMO | Admitting: Internal Medicine

## 2017-10-27 ENCOUNTER — Encounter: Payer: Self-pay | Admitting: Internal Medicine

## 2017-10-27 VITALS — BP 124/68 | HR 78 | Temp 98.1°F | Wt 117.4 lb

## 2017-10-27 DIAGNOSIS — M899 Disorder of bone, unspecified: Secondary | ICD-10-CM | POA: Diagnosis not present

## 2017-10-27 DIAGNOSIS — M810 Age-related osteoporosis without current pathological fracture: Secondary | ICD-10-CM | POA: Diagnosis not present

## 2017-10-27 DIAGNOSIS — G479 Sleep disorder, unspecified: Secondary | ICD-10-CM | POA: Diagnosis not present

## 2017-10-27 DIAGNOSIS — M949 Disorder of cartilage, unspecified: Secondary | ICD-10-CM | POA: Diagnosis not present

## 2017-10-27 DIAGNOSIS — I10 Essential (primary) hypertension: Secondary | ICD-10-CM | POA: Diagnosis not present

## 2017-10-27 DIAGNOSIS — E119 Type 2 diabetes mellitus without complications: Secondary | ICD-10-CM | POA: Diagnosis not present

## 2017-10-27 LAB — POCT GLYCOSYLATED HEMOGLOBIN (HGB A1C): HEMOGLOBIN A1C: 5.7

## 2017-10-27 MED ORDER — ALENDRONATE SODIUM 70 MG PO TABS: 70.0000 mg | ORAL_TABLET | ORAL | 3 refills | Status: DC

## 2017-10-27 MED ORDER — METFORMIN HCL 500 MG PO TABS: 500.0000 mg | ORAL_TABLET | Freq: Every day | ORAL | 3 refills | Status: DC

## 2017-10-27 MED ORDER — CALCIUM CARB-CHOLECALCIFEROL 1000-800 MG-UNIT PO TABS: 1.0000 | ORAL_TABLET | Freq: Every day | ORAL | 3 refills | Status: DC

## 2017-10-27 MED ORDER — PRAVASTATIN SODIUM 20 MG PO TABS: 20.0000 mg | ORAL_TABLET | Freq: Every day | ORAL | 3 refills | Status: DC

## 2017-10-27 MED ORDER — TRAZODONE HCL 50 MG PO TABS: 50.0000 mg | ORAL_TABLET | Freq: Every evening | ORAL | 0 refills | Status: DC | PRN

## 2017-10-27 MED ORDER — LISINOPRIL-HYDROCHLOROTHIAZIDE 20-25 MG PO TABS: 1.0000 | ORAL_TABLET | Freq: Every day | ORAL | 3 refills | Status: DC

## 2017-10-27 NOTE — Assessment & Plan Note (Signed)
Well controlled Continue lisinopril hydrochlorothiazide

## 2017-10-27 NOTE — Patient Instructions (Signed)
Overall you are doing well.  Keep on doing what you are doing.  I have prescribed you some trazodone. You can take 1 pill at night before going to bed if you are having trouble with sleep.  Please follow-up in 3-6 months.

## 2017-10-27 NOTE — Assessment & Plan Note (Signed)
A1c 5.7 today Recently had ophthalmology exam Continue metformin Follow-up in 4-6 months

## 2017-10-27 NOTE — Assessment & Plan Note (Signed)
Patient with some stress specially since she has been taking care of her brother who had a stroke.  she states that this is improving.  She states that happens every year during this time.  She states that sometimes she has trouble sleeping. Will provide patient with trazodone to take as needed for trouble with sleep Discussed using melatonin though patient states this does not help

## 2017-10-27 NOTE — Progress Notes (Signed)
   [facility] Noralee CharsAsiyah Reco Shonk, MD Phone: [phone]  Reason For Visit: Follow up   #Stress  She states she has been having a lot of stress lately.  She has been taking care of her brother who recently had a stroke.  She states that this is generally a bad time of year because this is the time where patient lost her husband several years back.  Overall feels like she is doing okay  # CHRONIC DM, Type 2: Meds: metformin  Reports  good compliance. Tolerating well w/o side-effects Currently on ACEi / ARB  Lifestyle: Weightlifting  #CHRONIC HTN: Current Meds - Lisinopril-HCTZ  Reports good compliance, took meds today. Tolerating well, w/o complaints. Lifestyle -as above Denies CP, dyspnea, HA, edema, dizziness / lightheadedness  #Osteoporesis  -Patient has been doing some weightlifting per discussion with me -Continue Fosamax, calcium, vitamin D  Past Medical History Reviewed problem list.  Medications- reviewed and updated No additions to family history Social history- patient is a non-smoker  Objective: BP 124/68   Pulse 78   Temp 98.1 F (36.7 C) (Oral)   Wt 117 lb 6.4 oz (53.3 kg)   SpO2 99%   BMI 20.15 kg/m  Gen: NAD, alert, cooperative with exam HEENT: Normal    Neck: No masses palpated. No lymphadenopathy     Eyes: PERRLA,     Nose: nasal turbinates moist    Throat: moist mucus membranes, no erythema Cardio: regular rate and rhythm, S1S2 heard, no murmurs appreciated Pulm: clear to auscultation bilaterally, no wheezes, rhonchi or rales GI: soft, non-tender, non-distended, bowel sounds present, no hepatomegaly, no splenomegaly Extremities: warm, well perfused, No edema, MSK: Normal gait and station Skin: dry, intact, no rashes or lesions Neuro: Strength and sensation grossly intact   Assessment/Plan: See problem based a/p  HYPERTENSION, BENIGN SYSTEMIC Well controlled Continue lisinopril hydrochlorothiazide  Controlled diabetes  mellitus type II without complication (HCC) A1c 5.7 today Recently had ophthalmology exam Continue metformin Follow-up in 4-6 months  Osteoporosis Continue Fosamax, calcium, vitamin D Continue weight lifting   Sleep disorder Patient with some stress specially since she has been taking care of her brother who had a stroke.  she states that this is improving.  She states that happens every year during this time.  She states that sometimes she has trouble sleeping. Will provide patient with trazodone to take as needed for trouble with sleep Discussed using melatonin though patient states this does not help

## 2017-10-27 NOTE — Assessment & Plan Note (Signed)
Continue Fosamax, calcium, vitamin D Continue weight lifting

## 2017-11-17 ENCOUNTER — Other Ambulatory Visit: Payer: Self-pay | Admitting: Internal Medicine

## 2017-12-21 ENCOUNTER — Other Ambulatory Visit: Payer: Self-pay | Admitting: Internal Medicine

## 2017-12-21 DIAGNOSIS — G479 Sleep disorder, unspecified: Secondary | ICD-10-CM

## 2017-12-21 MED ORDER — TRAZODONE HCL 50 MG PO TABS: 50.0000 mg | ORAL_TABLET | Freq: Every evening | ORAL | 0 refills | Status: DC | PRN

## 2018-01-05 ENCOUNTER — Other Ambulatory Visit: Payer: Self-pay | Admitting: *Deleted

## 2018-01-05 DIAGNOSIS — G479 Sleep disorder, unspecified: Secondary | ICD-10-CM

## 2018-01-05 MED ORDER — TRAZODONE HCL 50 MG PO TABS: 50.0000 mg | ORAL_TABLET | Freq: Every evening | ORAL | 0 refills | Status: DC | PRN

## 2018-01-05 NOTE — Telephone Encounter (Signed)
Wants 90-day supply. Deseree Blount, CMA  

## 2018-01-08 ENCOUNTER — Encounter: Payer: Self-pay | Admitting: Internal Medicine

## 2018-01-28 ENCOUNTER — Ambulatory Visit (INDEPENDENT_AMBULATORY_CARE_PROVIDER_SITE_OTHER): Payer: Medicare HMO | Admitting: Internal Medicine

## 2018-01-28 VITALS — BP 101/80 | HR 91 | Temp 98.4°F | Ht 64.0 in | Wt 114.6 lb

## 2018-01-28 DIAGNOSIS — T783XXA Angioneurotic edema, initial encounter: Secondary | ICD-10-CM

## 2018-01-28 DIAGNOSIS — I10 Essential (primary) hypertension: Secondary | ICD-10-CM

## 2018-01-28 DIAGNOSIS — E119 Type 2 diabetes mellitus without complications: Secondary | ICD-10-CM

## 2018-01-28 DIAGNOSIS — E785 Hyperlipidemia, unspecified: Secondary | ICD-10-CM

## 2018-01-28 LAB — POCT GLYCOSYLATED HEMOGLOBIN (HGB A1C): Hemoglobin A1C: 5.9

## 2018-01-28 MED ORDER — HYDROCHLOROTHIAZIDE 25 MG PO TABS
25.0000 mg | ORAL_TABLET | Freq: Every day | ORAL | 0 refills | Status: DC
Start: 2018-01-28 — End: 2018-04-25

## 2018-01-28 NOTE — Patient Instructions (Addendum)
Stop the current blood pressure medication regiment that you are on.  I have represcribed your blood pressure medication without the lisinopril.  If you have any symptoms of further swelling trouble breathing or any other issues please go to the emergency room.  Please follow-up in about 1 month for recheck of your blood pressure.

## 2018-01-28 NOTE — Progress Notes (Signed)
   [facility] Noralee CharsAsiyah Taquisha Phung, MD Phone: [phone]  Reason For Visit: Follow-up  #Angioedema  -Presents with angioedema.  She states that she has been having dental work done for the past 2 weeks.  About 1 week ago she had a tooth pulled.  Today she woke up with swelling in her top lip.  She states that the swelling has not gotten any worse and has gotten a little bit better since this morning.  She denies any swelling of her tongue or throat.  She denies any issues with breathing.  She denies any nausea or vomiting.  #CHRONIC DM, Type 2: Reports  no concerns Meds: metformin 500 mg daily Reports  good compliance. Tolerating well w/o side-effects Currently on ACEi / ARB, will stop ACE today given patient with angioedema Denies polyuria, visual changes, numbness or tingling.  Past Medical History Reviewed problem list.  Medications- reviewed and updated No additions to family history Social history- patient is a non- smoker  Objective: BP 101/80 (BP Location: Right Arm, Patient Position: Sitting, Cuff Size: Normal)   Pulse 91   Temp 98.4 F (36.9 C) (Oral)   Ht 5\' 4"  (1.626 m)   Wt 114 lb 9.6 oz (52 kg)   SpO2 99%   BMI 19.67 kg/m  Gen: NAD, alert, cooperative with exam HEENT: Normal    Neck: No masses palpated. No lymphadenopathy    Nose: nasal turbinates moist    Throat: Top lip slightly swollen compared to bottom lip, moist mucus membranes, no erythema,  no swelling in mouth Cardio: regular rate and rhythm, S1S2 heard, no murmurs appreciated Pulm: clear to auscultation bilaterally, no wheezes, rhonchi or rales Extremities: warm, well perfused, No edema   Diabetic Foot Exam - Simple   Simple Foot Form Diabetic Foot exam was performed with the following findings:  Yes 01/28/2018 11:00 AM  Visual Inspection No deformities, no ulcerations, no other skin breakdown bilaterally:  Yes Sensation Testing Intact to touch and monofilament testing  bilaterally:  Yes Pulse Check Posterior Tibialis and Dorsalis pulse intact bilaterally:  Yes Comments      Assessment/Plan: See problem based a/p  Angio-edema Angioedema noted on top lip during appointment today.  no signs or symptoms of swelling in the mouth or throat.  Normal respirations.  No signs of distress.  A lot of recent trauma to mouth was seen by the dentist for tooth removal about a week and a half ago.  No signs of infection though on exam - will stop lisinopril -Add ACE to patient's allergy list -Provided  with return to ED precautions if symptoms worsen -Follow-up in about 2 weeks to check on blood pressure  Controlled diabetes mellitus type II without complication (HCC) Well controlled,  Diabetic foot exam today  A1C  Metformin 500 mg daily

## 2018-02-02 DIAGNOSIS — T783XXA Angioneurotic edema, initial encounter: Secondary | ICD-10-CM | POA: Insufficient documentation

## 2018-02-02 NOTE — Assessment & Plan Note (Signed)
Angioedema noted on top lip during appointment today.  no signs or symptoms of swelling in the mouth or throat.  Normal respirations.  No signs of distress.  A lot of recent trauma to mouth was seen by the dentist for tooth removal about a week and a half ago.  No signs of infection though on exam - will stop lisinopril -Add ACE to patient's allergy list -Provided  with return to ED precautions if symptoms worsen -Follow-up in about 2 weeks to check on blood pressure

## 2018-02-02 NOTE — Assessment & Plan Note (Signed)
Well controlled,  Diabetic foot exam today  A1C  Metformin 500 mg daily

## 2018-02-25 ENCOUNTER — Ambulatory Visit: Payer: Medicare HMO | Admitting: Internal Medicine

## 2018-03-09 ENCOUNTER — Ambulatory Visit (INDEPENDENT_AMBULATORY_CARE_PROVIDER_SITE_OTHER): Payer: Medicare HMO | Admitting: Internal Medicine

## 2018-03-09 ENCOUNTER — Encounter: Payer: Self-pay | Admitting: Internal Medicine

## 2018-03-09 ENCOUNTER — Other Ambulatory Visit: Payer: Self-pay

## 2018-03-09 VITALS — BP 164/80 | HR 70 | Temp 98.1°F | Ht 64.0 in | Wt 115.8 lb

## 2018-03-09 DIAGNOSIS — R1310 Dysphagia, unspecified: Secondary | ICD-10-CM | POA: Diagnosis not present

## 2018-03-09 DIAGNOSIS — K219 Gastro-esophageal reflux disease without esophagitis: Secondary | ICD-10-CM

## 2018-03-09 DIAGNOSIS — Z8639 Personal history of other endocrine, nutritional and metabolic disease: Secondary | ICD-10-CM | POA: Diagnosis not present

## 2018-03-09 DIAGNOSIS — I10 Essential (primary) hypertension: Secondary | ICD-10-CM | POA: Diagnosis not present

## 2018-03-09 MED ORDER — AMLODIPINE BESYLATE 5 MG PO TABS: 10.0000 mg | ORAL_TABLET | Freq: Every day | ORAL | 0 refills | Status: DC

## 2018-03-09 MED ORDER — RANITIDINE HCL 150 MG PO TABS: 150.0000 mg | ORAL_TABLET | Freq: Two times a day (BID) | ORAL | 1 refills | Status: DC

## 2018-03-09 NOTE — Patient Instructions (Signed)
I want you to stop metformin.  I want you to stop taking Protonix.  I am going to replace that medication with ranitidine which is a medication you will take twice daily.  Once in the morning once at night.  I also am going to you on a second blood pressure medication.  Please follow-up in about 1 month to recheck on your blood pressure.  With the issues that you discussed with swallowing,  let continue to watch it, if there is no improvement over the next month or it worsens I will send you to gastroenterology

## 2018-03-09 NOTE — Progress Notes (Signed)
   [facility] Noralee CharsAsiyah Rakan Soffer, MD Phone: [phone]  Reason For Visit: Follow up   # CHRONIC HTN: previously very well controlled on lisinopril, however patient had an episode of angioedema and therefore lisinopril was stopped Current medications now hydrochlorothiazide 25 mg Reports good compliance, took meds today. Tolerating well, w/o complaints. Lifestyle -discussed decreasing salt intake-patient said last night she had fat-back meat and pork chops  Denies CP, dyspnea, HA, edema, dizziness / lightheadedness  #GERD:  -Patient denies any recent symptoms with her reflux.  Has been on Protonix for about a year.  She does have a history of osteoporosis.  #Dysphagia: -Patient states that last week she had some trouble swallowing meat.  She states that she felt like the food was just sitting in her esophagus.  Denies any further episode.  she denies any issues with swallowing liquids.   Past Medical History Reviewed problem list.  Medications- reviewed and updated No additions to family history Social history- patient is a non-smoker  Objective: BP (!) 164/80   Pulse 70   Temp 98.1 F (36.7 C) (Oral)   Ht 5\' 4"  (1.626 m)   Wt 115 lb 12.8 oz (52.5 kg)   SpO2 99%   BMI 19.88 kg/m  Gen: NAD, alert, cooperative with exam Cardio: regular rate and rhythm, S1S2 heard, no murmurs appreciated Pulm: clear to auscultation bilaterally, no wheezes, rhonchi or rales Skin: dry, intact, no rashes or lesions   Assessment/Plan: See problem based a/p  Hx of diabetes mellitus A1c's have been less than 6.5 since 2010 -Currently on metformin 500 mg only -Will stop this medication -Check A1c in 3 months-then follow-up once yearly  GERD (gastroesophageal reflux disease) Will stop Protonix given patient has history of osteoporosis Will start Zantac twice daily   Dysphagia Trouble swallowing meat x1 -given history this could possibly be a lower esophageal  stricture-no history of tobacco or alcohol use therefore limited risk factors for esophageal carcinoma.  Given one episode will have patient watch for further episodes -Follow-up in 1 month  HYPERTENSION, BENIGN SYSTEMIC Blood pressure not well controlled previously patient was on lisinopril however recent episode of angioedema prohibits use of ACE or ARB - Started patient on hydrochlorothiazide at last visit - Will add on a Norvasc

## 2018-03-10 DIAGNOSIS — R1310 Dysphagia, unspecified: Secondary | ICD-10-CM | POA: Insufficient documentation

## 2018-03-10 NOTE — Assessment & Plan Note (Signed)
Will stop Protonix given patient has history of osteoporosis Will start Zantac twice daily

## 2018-03-10 NOTE — Assessment & Plan Note (Signed)
A1c's have been less than 6.5 since 2010 -Currently on metformin 500 mg only -Will stop this medication -Check A1c in 3 months-then follow-up once yearly

## 2018-03-10 NOTE — Assessment & Plan Note (Signed)
Blood pressure not well controlled previously patient was on lisinopril however recent episode of angioedema prohibits use of ACE or ARB - Started patient on hydrochlorothiazide at last visit - Will add on a Norvasc

## 2018-03-10 NOTE — Assessment & Plan Note (Signed)
Trouble swallowing meat x1 -given history this could possibly be a lower esophageal stricture-no history of tobacco or alcohol use therefore limited risk factors for esophageal carcinoma.  Given one episode will have patient watch for further episodes -Follow-up in 1 month

## 2018-03-31 ENCOUNTER — Other Ambulatory Visit: Payer: Self-pay

## 2018-03-31 DIAGNOSIS — I10 Essential (primary) hypertension: Secondary | ICD-10-CM

## 2018-04-01 MED ORDER — AMLODIPINE BESYLATE 5 MG PO TABS: 10.0000 mg | ORAL_TABLET | Freq: Every day | ORAL | 3 refills | Status: DC

## 2018-04-06 ENCOUNTER — Ambulatory Visit: Payer: Medicare HMO | Admitting: Internal Medicine

## 2018-04-09 ENCOUNTER — Encounter: Payer: Self-pay | Admitting: Internal Medicine

## 2018-04-09 ENCOUNTER — Ambulatory Visit (INDEPENDENT_AMBULATORY_CARE_PROVIDER_SITE_OTHER): Payer: Medicare HMO | Admitting: Internal Medicine

## 2018-04-09 VITALS — BP 145/90 | HR 91 | Temp 98.4°F | Wt 110.6 lb

## 2018-04-09 DIAGNOSIS — K219 Gastro-esophageal reflux disease without esophagitis: Secondary | ICD-10-CM | POA: Diagnosis not present

## 2018-04-09 DIAGNOSIS — K5900 Constipation, unspecified: Secondary | ICD-10-CM

## 2018-04-09 DIAGNOSIS — I10 Essential (primary) hypertension: Secondary | ICD-10-CM | POA: Diagnosis not present

## 2018-04-09 MED ORDER — AMLODIPINE BESYLATE 10 MG PO TABS: 10.0000 mg | ORAL_TABLET | Freq: Every day | ORAL | 3 refills | Status: DC

## 2018-04-09 MED ORDER — METOPROLOL TARTRATE 25 MG PO TABS: 25.0000 mg | ORAL_TABLET | Freq: Two times a day (BID) | ORAL | 2 refills | Status: DC

## 2018-04-09 MED ORDER — PANTOPRAZOLE SODIUM 20 MG PO TBEC: 20.0000 mg | DELAYED_RELEASE_TABLET | Freq: Every day | ORAL | 0 refills | Status: DC

## 2018-04-09 MED ORDER — POLYETHYLENE GLYCOL 3350 17 GM/SCOOP PO POWD: 17.0000 g | Freq: Two times a day (BID) | ORAL | 1 refills | Status: DC | PRN

## 2018-04-09 NOTE — Patient Instructions (Signed)
It was nice seeing you today.  I am going to change you back to Protonix the medication you are on previously for your reflux given the ranitidine was not working.  I am also going to add on a blood pressure medication called Lopressor.  I have also changed your Norvasc to 1 pill rather than 2 pills to help consolidate your medications.

## 2018-04-09 NOTE — Progress Notes (Signed)
   [name] [name], MD Phone: [phone]  Reason For Visit: Follow up   # CHRONIC HTN: Current Meds -HCTZ, Norvasc  Reports good compliance, took meds today. Tolerating well, w/o complaints. Denies CP, dyspnea, HA, edema, dizziness / lightheadedness  # GERD  -Patient states she has been having burning sensation and cough at night.  We recently changed her from Protonix to ranitidine.  -She feels like this medication does not work for her.   #Constipation  -She has been suffering from bouts of constipation.  She is not currently taking anything for her constipation  Past Medical History Reviewed problem list.  Medications- reviewed and updated No additions to family history  Objective: BP (!) 145/90 (BP Location: Right Arm, Patient Position: Sitting, Cuff Size: Normal)   Pulse 91   Temp 98.4 F (36.9 C) (Oral)   Wt 110 lb 9.6 oz (50.2 kg)   SpO2 97%   BMI 18.98 kg/m  Gen: NAD, alert, cooperative with exam Cardio: regular rate and rhythm, S1S2 heard, no murmurs appreciated Pulm: clear to auscultation bilaterally, no wheezes, rhonchi or rales Skin: dry, intact, no rashes or lesions   Assessment/Plan: See problem based a/p  HYPERTENSION, BENIGN SYSTEMIC Blood pressure still elevated  -Continue Norvasc and hydrochlorothiazide -Will add on Lopressor 25 mg BID  -Will follow up in two weeks   Constipation Provided mirlax   GERD (gastroesophageal reflux disease) Has failed transition to an H2 blocker.  Discussed risks and benefits of using a PPI with patient Will restart Protonix at a reduced dose of 20 mg Follow up as needed

## 2018-04-09 NOTE — Assessment & Plan Note (Signed)
Has failed transition to an H2 blocker.  Discussed risks and benefits of using a PPI with patient Will restart Protonix at a reduced dose of 20 mg Follow up as needed

## 2018-04-09 NOTE — Assessment & Plan Note (Signed)
Provided mirlax

## 2018-04-09 NOTE — Assessment & Plan Note (Signed)
Blood pressure still elevated  -Continue Norvasc and hydrochlorothiazide -Will add on Lopressor 25 mg BID  -Will follow up in two weeks

## 2018-04-23 ENCOUNTER — Ambulatory Visit (INDEPENDENT_AMBULATORY_CARE_PROVIDER_SITE_OTHER): Payer: Medicare HMO | Admitting: Internal Medicine

## 2018-04-23 ENCOUNTER — Encounter: Payer: Self-pay | Admitting: Internal Medicine

## 2018-04-23 ENCOUNTER — Other Ambulatory Visit: Payer: Self-pay | Admitting: *Deleted

## 2018-04-23 VITALS — BP 140/80 | HR 69 | Temp 97.9°F | Ht 64.0 in | Wt 113.8 lb

## 2018-04-23 DIAGNOSIS — I10 Essential (primary) hypertension: Secondary | ICD-10-CM

## 2018-04-23 NOTE — Assessment & Plan Note (Signed)
Blood pressure slightly improved  -Continue Norvasc and hydrochlorothiazide - Continue lopressor for now  -If blood pressure is not 130/80 at next visit would consider stopping Lopressor as this is likely helping blood pressure that much.  Would consider starting  instead aldactone instead as this will likely be stronger blood pressure medication

## 2018-04-23 NOTE — Progress Notes (Signed)
   [name] [name], MD Phone: [phone]  Reason For Visit: Follow up for Blood pressure   CHRONIC HTN: Reports she has not been taking the lopressor consistently at night. Blood pressure is a bit better controlled than previously  Current Meds - Lopressor, HCTZ, norvasc Reports good compliance, took meds today. Tolerating well, w/o complaints. Lifestyle - exercising  Denies CP, dyspnea, HA, edema, dizziness / lightheadedness  Past Medical History Reviewed problem list.  Medications- reviewed and updated No additions to family history Social history- patient is a non- smoker  Objective: BP 140/80 (BP Location: Left Arm, Patient Position: Sitting, Cuff Size: Normal)   Pulse 69   Temp 97.9 F (36.6 C) (Oral)   Ht  (1.626 m)   Wt 113 lb 12.8 oz (51.6 kg)   SpO2 100%   BMI 19.53 kg/m  Gen: NAD, alert, cooperative with exam Cardio: regular rate and rhythm, S1S2 heard, no murmurs appreciated Pulm: clear to auscultation bilaterally, no wheezes, rhonchi or rales Skin: dry, intact, no rashes or lesions   Assessment/Plan: See problem based a/p  HYPERTENSION, BENIGN SYSTEMIC Blood pressure slightly improved  -Continue Norvasc and hydrochlorothiazide - Continue lopressor for now  -If blood pressure is not 130/80 at next visit would consider stopping Lopressor as this is likely helping blood pressure that much.  Would consider starting  instead aldactone instead as this will likely be stronger blood pressure medication

## 2018-04-23 NOTE — Patient Instructions (Signed)
Please follow up in July

## 2018-04-25 MED ORDER — HYDROCHLOROTHIAZIDE 25 MG PO TABS: 25.0000 mg | ORAL_TABLET | Freq: Every day | ORAL | 0 refills | Status: DC

## 2018-05-06 ENCOUNTER — Telehealth: Payer: Self-pay

## 2018-05-06 ENCOUNTER — Telehealth: Payer: Self-pay | Admitting: *Deleted

## 2018-05-06 NOTE — Telephone Encounter (Signed)
Patient is no longer taking this medication as it does not work for her.

## 2018-05-06 NOTE — Telephone Encounter (Signed)
Rx request for ranitidine 150 mg tablet. Please advise. Deseree [name], CMA

## 2018-05-07 NOTE — Telephone Encounter (Signed)
No need for refill, patient no longer on this medication

## 2018-05-13 ENCOUNTER — Ambulatory Visit: Payer: Medicare HMO | Admitting: Internal Medicine

## 2018-05-13 ENCOUNTER — Telehealth: Payer: Self-pay | Admitting: Internal Medicine

## 2018-05-13 DIAGNOSIS — I10 Essential (primary) hypertension: Secondary | ICD-10-CM

## 2018-05-13 MED ORDER — BLOOD PRESSURE CUFF MISC: 0 refills | Status: DC

## 2018-05-13 NOTE — Telephone Encounter (Signed)
Place order at patient's request for blood pressure cuff.

## 2018-05-13 NOTE — Telephone Encounter (Signed)
Please call patient as requested concerning a blood pressure cuff and glucose meter.  Pamela [name], [name], CMA

## 2018-05-13 NOTE — Telephone Encounter (Signed)
Pt would like to have an order put in for a blood pressure cuff and a sugar meter. This is from her insurance company.  There was no direction as to where to order the supplies.  Pt was confused as to where to start with this,  Please advise

## 2018-05-24 ENCOUNTER — Other Ambulatory Visit: Payer: Self-pay

## 2018-05-24 ENCOUNTER — Encounter: Payer: Self-pay | Admitting: Internal Medicine

## 2018-05-24 ENCOUNTER — Ambulatory Visit (INDEPENDENT_AMBULATORY_CARE_PROVIDER_SITE_OTHER): Payer: Medicare HMO | Admitting: Internal Medicine

## 2018-05-24 DIAGNOSIS — I10 Essential (primary) hypertension: Secondary | ICD-10-CM | POA: Diagnosis not present

## 2018-05-24 MED ORDER — EPLERENONE 25 MG PO TABS: 25.0000 mg | ORAL_TABLET | Freq: Every day | ORAL | 1 refills | Status: DC

## 2018-05-24 NOTE — Patient Instructions (Signed)
I want you to stop lopressor and start [place]. You need to follow up with me 1 week to recheck your potassium levels and your blood pressure.

## 2018-05-24 NOTE — Progress Notes (Signed)
   [facility] Noralee CharsAsiyah Kandi Brusseau, MD Phone: [phone]  Reason For Visit: Follow up BP  # CHRONIC HTN: Current Meds - Norvasc, HCTZ, Lopressor  Reports good compliance, took meds today. Tolerating well, w/o complaints. Denies CP, dyspnea, HA, edema, dizziness / lightheadedness  Past Medical History Reviewed problem list.  Medications- reviewed and updated No additions to family history Social history- patient is a non- smoker  Objective: BP (!) 142/68   Pulse 67   Temp 98.1 F (36.7 C) (Oral)   Ht 5\' 4"  (1.626 m)   Wt 112 lb (50.8 kg)   SpO2 98%   BMI 19.22 kg/m  Gen: NAD, alert, cooperative with exam Cardio: regular rate and rhythm, S1S2 heard, no murmurs appreciated Pulm: clear to auscultation bilaterally, no wheezes, rhonchi or rales Skin: dry, intact, no rashes or lesions  Assessment/Plan: See problem based a/p  HYPERTENSION, BENIGN SYSTEMIC Blood pressure has not been well controlled ever since patient has been off of ACE given history of angioedema -Previously patient has been on Norvasc, hydrochlorothiazide and Lopressor -Given Lopressor is beta selective-less likely to be as effective -Will try Inspra  -Obtain bmet today,  -Follow-up in 1 week for repeat BMET

## 2018-05-25 ENCOUNTER — Telehealth: Payer: Self-pay | Admitting: Internal Medicine

## 2018-05-25 DIAGNOSIS — I10 Essential (primary) hypertension: Secondary | ICD-10-CM

## 2018-05-25 DIAGNOSIS — S0502XA Injury of conjunctiva and corneal abrasion without foreign body, left eye, initial encounter: Secondary | ICD-10-CM | POA: Diagnosis not present

## 2018-05-25 LAB — BASIC METABOLIC PANEL
BUN / CREAT RATIO: 14 (ref 12–28)
BUN: 15 mg/dL (ref 8–27)
CHLORIDE: 99 mmol/L (ref 96–106)
CO2: 29 mmol/L (ref 20–29)
Calcium: 10.3 mg/dL (ref 8.7–10.3)
Creatinine, Ser: 1.09 mg/dL — ABNORMAL HIGH (ref 0.57–1.00)
GFR calc non Af Amer: 53 mL/min/{1.73_m2} — ABNORMAL LOW (ref >59)
GFR, EST AFRICAN AMERICAN: 61 mL/min/{1.73_m2} (ref >59)
GLUCOSE: 120 mg/dL — AB (ref 65–99)
Potassium: 3.7 mmol/L (ref 3.5–5.2)
SODIUM: 143 mmol/L (ref 134–144)

## 2018-05-25 NOTE — Telephone Encounter (Signed)
Pt said the new bp medication that was called in for her yesterday is $100+ with her insurance and she cannot afford that. Pt would like something else called in that she will not have to pay for. Please advise

## 2018-05-26 NOTE — Assessment & Plan Note (Addendum)
Blood pressure has not been well controlled ever since patient has been off of ACE given history of angioedema -Previously patient has been on Norvasc, hydrochlorothiazide and Lopressor -Given Lopressor is beta selective-less likely to be as effective -Will try Inspra  -Obtain bmet today,  -Follow-up in 1 week for repeat BMET

## 2018-05-28 MED ORDER — SPIRONOLACTONE 25 MG PO TABS: 25.0000 mg | ORAL_TABLET | Freq: Every day | ORAL | 0 refills | Status: DC

## 2018-05-28 NOTE — Telephone Encounter (Signed)
Called patient to change blood pressure medication to spironolactone. Will have her follow up in 1 week for blood pressure and BMET check

## 2018-05-31 ENCOUNTER — Ambulatory Visit: Payer: Medicare HMO | Admitting: Internal Medicine

## 2018-06-10 NOTE — Progress Notes (Signed)
Subjective:    Patient ID: Pamela [name], female    DOB: September 26, 1951, [age] y.o.   MRN: [id number]   CC: cramps  HPI: Cramps Patient today reporting cramps x1 week.  Say they occur at night, around 3 AM patient says cramps occur bilaterally and calves and occasionally in her toes bilaterally.  Patient has not had any symptoms for the past 2 days.  Cramps are improved with walking.  No inciting events that patient is aware of.  Patient says she drinks "lots of water" reporting she drinks 216 ounce water bottles a day.  Patient says when she stretches in bed before sleeping sometimes cramps occur then as well.  HTN Patient today with elevated blood pressure reading of 160/80.  Patient is asymptomatic at this time.  Patient has recent stressful events in her life.  Patient's brother is currently admitted to the hospital and she was up with him all night as she stayed with him in the hospital.  Patient is very stressed about this and became tearful.  Patient does have good support, says her best friend is here with her today.  Objective:  BP (!) 160/80 (BP Location: Right Arm, Patient Position: Sitting, Cuff Size: Normal)   Pulse 99   Temp 98 F (36.7 C) (Oral)   Wt 110 lb 9.6 oz (50.2 kg)   SpO2 98%   BMI 18.98 kg/m  Vitals and nursing note reviewed  General: well nourished, in no acute distress HEENT: normocephalic, PERRL, no scleral icterus or conjunctival pallor, no nasal discharge, moist mucous membranes Neck: supple, non-tender, without lymphadenopathy Cardiac: RRR, clear S1 and S2, no murmurs, rubs, or gallops Respiratory: clear to auscultation bilaterally, no increased work of breathing Abdomen: soft, nontender, nondistended, no masses or organomegaly. Bowel sounds present Extremities: no cyanosis. Warm, well perfused.  No edema in lower extremities.  Negative Homans sign.  Nontender to palpation bilaterally Skin: warm and dry, no rashes noted Neuro: alert and oriented, no focal  deficits   Assessment & Plan:    Cramp of both lower extremities Unclear etiology at this time.  Possibly secondary to poor water intake.  Encourage patient to increase water intake as she is only drinking 32 ounces a day.  Patient is also on hydrochlorothiazide and spironolactone so possible electrolyte abnormalities.  Will obtain BMP today to rule out electrolyte imbalance.  Encourage patient to stretch daily to help prevent cramping at night.  Calf stretching exercises provided in patient's AVS and demonstrated in room for patient.  Encourage patient to stretch during the day if stretching is painful at nighttime. -BMP -Follow-up if no improvement -Increase water intake  HYPERTENSION, BENIGN SYSTEMIC Patient with elevated blood pressures today likely secondary to stress.  Patient was very tearful on exam when discussing her brother's recent hospitalization.  Patient says normally her blood pressure does run lower, but she does need to purchase a new blood pressure cuff.  Given that patient has recent life stressors this is likely because of elevated blood pressure.  Encouraged patient to follow-up with behavioral health or with PCP.  Offered warm handoff to behavioral health during today's visit, patient refused at this time saying her best friend is waiting for her in the waiting room and she provides great support. -Follow-up in 1 month to ensure that blood pressures normalized -Offered resources of behavioral health and clinic visits, patient refusing at this time but aware of resources if needed    Return in about 1 month (around 07/12/2018).  Oralia Manis, DO, PGY-2

## 2018-06-11 ENCOUNTER — Encounter: Payer: Self-pay | Admitting: Family Medicine

## 2018-06-11 ENCOUNTER — Ambulatory Visit (INDEPENDENT_AMBULATORY_CARE_PROVIDER_SITE_OTHER): Payer: Medicare HMO | Admitting: Family Medicine

## 2018-06-11 VITALS — BP 160/80 | HR 99 | Temp 98.0°F | Wt 110.6 lb

## 2018-06-11 DIAGNOSIS — R252 Cramp and spasm: Secondary | ICD-10-CM

## 2018-06-11 DIAGNOSIS — I10 Essential (primary) hypertension: Secondary | ICD-10-CM

## 2018-06-11 MED ORDER — AMLODIPINE BESYLATE 10 MG PO TABS: 10.0000 mg | ORAL_TABLET | Freq: Every day | ORAL | 3 refills | Status: DC

## 2018-06-11 MED ORDER — HYDROCHLOROTHIAZIDE 25 MG PO TABS: 25.0000 mg | ORAL_TABLET | Freq: Every day | ORAL | 0 refills | Status: DC

## 2018-06-11 MED ORDER — SPIRONOLACTONE 25 MG PO TABS: 25.0000 mg | ORAL_TABLET | Freq: Every day | ORAL | 0 refills | Status: DC

## 2018-06-11 NOTE — Assessment & Plan Note (Signed)
Patient with elevated blood pressures today likely secondary to stress.  Patient was very tearful on exam when discussing her brother's recent hospitalization.  Patient says normally her blood pressure does run lower, but she does need to purchase a new blood pressure cuff.  Given that patient has recent life stressors this is likely because of elevated blood pressure.  Encouraged patient to follow-up with behavioral health or with PCP.  Offered warm handoff to behavioral health during today's visit, patient refused at this time saying her best friend is waiting for her in the waiting room and she provides great support. -Follow-up in 1 month to ensure that blood pressures normalized -Offered resources of behavioral health and clinic visits, patient refusing at this time but aware of resources if needed

## 2018-06-11 NOTE — Patient Instructions (Signed)
Exercise A: Calf stretch, seated  1. Sit with your left / right leg extended. 2. Hold onto both ends of a belt or towel and loop it around the ball of your left / right foot. The ball of your foot is on the walking surface, right under your toes. 3. Keep your left / right ankle and foot relaxed and keep your knee straight while you use the belt or towel to pull your foot and ankle toward you. You should feel a gentle stretch behind your calf or knee. 4. Hold this position for 15-30 seconds.   It was a pleasure seeing you today.   Today we discussed your cramps  For your cramps: I will draw labs today. Please continue to drink plenty of water. Please stretch every morning as this will help prevent cramps at night.   Please follow up in 1 month for blood pressure or sooner if symptoms persist or worsen. Please call the clinic immediately if you have any concerns.   Our clinic's number is [phone]. Please call with questions or concerns.   Please go to the emergency room if headaches with vision changes, stroke like symptoms, or weakness.   Thank you,  Oralia ManisSherin Khaden Gater, DO

## 2018-06-11 NOTE — Assessment & Plan Note (Signed)
Unclear etiology at this time.  Possibly secondary to poor water intake.  Encourage patient to increase water intake as she is only drinking 32 ounces a day.  Patient is also on hydrochlorothiazide and spironolactone so possible electrolyte abnormalities.  Will obtain BMP today to rule out electrolyte imbalance.  Encourage patient to stretch daily to help prevent cramping at night.  Calf stretching exercises provided in patient's AVS and demonstrated in room for patient.  Encourage patient to stretch during the day if stretching is painful at nighttime. -BMP -Follow-up if no improvement -Increase water intake

## 2018-06-12 LAB — BASIC METABOLIC PANEL
BUN/Creatinine Ratio: 11 — ABNORMAL LOW (ref 12–28)
BUN: 13 mg/dL (ref 8–27)
CALCIUM: 10.3 mg/dL (ref 8.7–10.3)
CO2: 24 mmol/L (ref 20–29)
CREATININE: 1.14 mg/dL — AB (ref 0.57–1.00)
Chloride: 96 mmol/L (ref 96–106)
GFR, EST AFRICAN AMERICAN: 58 mL/min/{1.73_m2} — AB (ref >59)
GFR, EST NON AFRICAN AMERICAN: 50 mL/min/{1.73_m2} — AB (ref >59)
Glucose: 263 mg/dL — ABNORMAL HIGH (ref 65–99)
Potassium: 4.3 mmol/L (ref 3.5–5.2)
SODIUM: 136 mmol/L (ref 134–144)

## 2018-06-23 ENCOUNTER — Other Ambulatory Visit: Payer: Self-pay

## 2018-06-23 ENCOUNTER — Ambulatory Visit (INDEPENDENT_AMBULATORY_CARE_PROVIDER_SITE_OTHER): Payer: Medicare HMO | Admitting: Family Medicine

## 2018-06-23 VITALS — BP 152/76 | HR 79 | Ht 64.0 in | Wt 108.0 lb

## 2018-06-23 DIAGNOSIS — R252 Cramp and spasm: Secondary | ICD-10-CM

## 2018-06-23 DIAGNOSIS — E119 Type 2 diabetes mellitus without complications: Secondary | ICD-10-CM

## 2018-06-23 LAB — POCT GLYCOSYLATED HEMOGLOBIN (HGB A1C): HBA1C, POC (CONTROLLED DIABETIC RANGE): 6.2 % (ref 0.0–7.0)

## 2018-06-23 NOTE — Progress Notes (Signed)
Subjective:    Patient ID: Pamela [name], female    DOB: 1951-10-27, 67 y.o.   MRN: [id number]   CC: Follow-up for leg cramping  HPI: Patient is a 67 year old female who presents today to follow-up on lower extremity cramping for the past 3 weeks.  Patient was recently seen and given home exercises and told to increase fluid intake.  Electrolytes were within normal limits and calcium was also within normal limits.  Patient reports that she has been doing exercises in the morning and has not noticed any change in her calf cramping mostly at night.  Patient denies any prior similar symptoms.  She is otherwise healthy with no acute complaint.  Patient continued to take all her medication as prescribed.  Patient has history of osteoporosis and is currently on Fosamax and calcium supplementation.  Patient also reports increased fluid intake for the past 3 weeks.  She denies any chest pain, shortness of breath, dizziness, headache, dysuria, polyuria or polydipsia.  Smoking status reviewed   ROS: all other systems were reviewed and are negative other than in the HPI   Past Medical History:  Diagnosis Date  . Abdominal pain, left lower quadrant 07/18/2015   Left Lower Quadrant Abdominal Pain. Patient with hysterectomy in the past. No vaginal bleeding/pain. No complaints of dysuria or incontinence. UA not significant. No constipation or hematochezia. Colonoscopy in 2015 significant for diverticulosis. CT abdomen in 2016 without hernias. No significant weight loss  - Likely diverticulitis - Will start patient Flagyl 100 mg TID and Ciprofloxacin 100 mg   . Anemia   . Anxiety    passing of husband  . Diabetes mellitus without complication (HCC)    metformin  . GERD (gastroesophageal reflux disease)   . Hypercholesteremia   . Hypertension     Past Surgical History:  Procedure Laterality Date  . PARTIAL HYSTERECTOMY      Past medical history, surgical, family, and social history reviewed and  updated in the EMR as appropriate.  Objective:  BP (!) 152/76   Pulse 79   Ht 5\' 4"  (1.626 m)   Wt 108 lb (49 kg)   SpO2 98%   BMI 18.54 kg/m   Vitals and nursing note reviewed  General: NAD, pleasant, able to participate in exam Cardiac: RRR, normal heart sounds, no murmurs. 2+ radial and PT pulses bilaterally Respiratory: CTAB, normal effort, No wheezes, rales or rhonchi Abdomen: soft, nontender, nondistended, no hepatic or splenomegaly, +BS Extremities: no edema or cyanosis. WWP. Skin: warm and dry, no rashes noted Neuro: alert and oriented x4, no focal deficits Psych: Normal affect and mood   Assessment & Plan:    Cramp of both lower extremities Patient presents today with bilateral calf cramping at night.  BMP and calcium within normal limits.  She denies any numbness, tingling or burning.  Patient has increased fluid intake and is currently on calcium.  Home exercises have not helped although she has been doing them in the morning.  A1c today 6.2 slightly higher than last check at 5.9.  Symptoms are not consistent with neuropathy. Will not check B12 today.  She will continue calcium supplementation.  She will also continue with increased fluid intake. --Recommended stretching prior to going to bed in the evening --Recommended tonic water, there is some evidence that it could help with leg cramping. --Continue calcium supplementation --Dietary changes given increase in A1c --Follow-up in clinic as needed if symptoms do not improve or worsen    Jahred Tatar,  MD [facility] PGY-3

## 2018-06-23 NOTE — Patient Instructions (Signed)
It was great seeing you today! We have addressed the following issues today  1. Continue your stretching exercises and do them at night before bed. 2. Continue to drink plenty of water  3. You can also drink some tonic water which sometimes has helped patient with similar symptoms.  If we did any lab work today, and the results require attention, either me or my nurse will get in touch with you. If everything is normal, you will get a letter in mail and a message via . If you don't hear from us in two weeks, please give us a call. Otherwise, we look forward to seeing you again at your next visit. If you have any questions or concerns before then, please call the clinic at [phone].  Please bring all your medications to every doctors visit  Sign up for My Chart to have easy access to your labs results, and communication with your Primary care physician. Please ask Front Desk for some assistance.   Please check-out at the front desk before leaving the clinic.    Take Care,   Dr. Sydnee Cabaliallo

## 2018-06-23 NOTE — Assessment & Plan Note (Signed)
Patient presents today with bilateral calf cramping at night.  BMP and calcium within normal limits.  She denies any numbness, tingling or burning.  Patient has increased fluid intake and is currently on calcium.  Home exercises have not helped although she has been doing them in the morning.  A1c today 6.2 slightly higher than last check at 5.9.  Symptoms are not consistent with neuropathy. Will not check B12 today.  She will continue calcium supplementation.  She will also continue with increased fluid intake. --Recommended stretching prior to going to bed in the evening --Recommended tonic water, there is some evidence that it could help with leg cramping. --Continue calcium supplementation --Dietary changes given increase in A1c --Follow-up in clinic as needed if symptoms do not improve or worsen

## 2018-06-28 ENCOUNTER — Other Ambulatory Visit: Payer: Self-pay | Admitting: Family Medicine

## 2018-06-28 DIAGNOSIS — Z1231 Encounter for screening mammogram for malignant neoplasm of breast: Secondary | ICD-10-CM

## 2018-07-14 ENCOUNTER — Other Ambulatory Visit: Payer: Self-pay

## 2018-07-14 ENCOUNTER — Encounter: Payer: Self-pay | Admitting: Family Medicine

## 2018-07-14 ENCOUNTER — Ambulatory Visit (INDEPENDENT_AMBULATORY_CARE_PROVIDER_SITE_OTHER): Payer: Medicare HMO | Admitting: Family Medicine

## 2018-07-14 VITALS — BP 150/72 | HR 95 | Temp 98.2°F | Ht 64.0 in | Wt 109.6 lb

## 2018-07-14 DIAGNOSIS — R252 Cramp and spasm: Secondary | ICD-10-CM | POA: Diagnosis not present

## 2018-07-14 DIAGNOSIS — I10 Essential (primary) hypertension: Secondary | ICD-10-CM | POA: Diagnosis not present

## 2018-07-14 DIAGNOSIS — R7989 Other specified abnormal findings of blood chemistry: Secondary | ICD-10-CM | POA: Insufficient documentation

## 2018-07-14 DIAGNOSIS — M6281 Muscle weakness (generalized): Secondary | ICD-10-CM | POA: Diagnosis not present

## 2018-07-14 MED ORDER — SPIRONOLACTONE 50 MG PO TABS: 50.0000 mg | ORAL_TABLET | Freq: Every day | ORAL | 1 refills | Status: DC

## 2018-07-14 NOTE — Assessment & Plan Note (Signed)
She continues to have elevated blood pressures today.  Patient says that despite changing medications and adding on a third agent she continues to have elevated blood pressures.  Patient cannot tolerate ACE inhibitors as this causes angioedema.  Discussed blood pressure management with patient.  But patient continues to have healthy diet and exercise daily.  Discussed whether patient would prefer increasing Spironolactone to 50 mg or switching spironolactone to an ARB.  Patient says that she would much rather increase her Spironolactone dose to 50 mg as she has not tolerated other blood pressure medications in the past.  Patient says that she likes the 3 medication she is on now she just wishes that her blood pressure was more controlled.  We will plan to increase Spironolactone to 50 mg daily, Rx sent to pharmacy. -Continue Norvasc 10 mg and hydrochlorothiazide 25 mg -Increase Spironolactone to 25 mg -Follow-up in 1 week with a nursing blood pressure check -Patient states she is going to buy a new blood pressure monitor -Follow-up in 1 month with MD for hypertension management

## 2018-07-14 NOTE — Progress Notes (Signed)
Subjective:    Patient ID: Pamela [name], female    DOB: August 08, 1951, 67 y.o.   MRN: [id number]   CC: HTN follow-up and cramps and leg  HPI: Hypertension: - Medications: Norvasc 10mg , Spironolactone 25mg , HCTZ 25mg   - Compliance: good - Checking BP at home: no, needs a new meter  - Denies any SOB, CP, vision changes, LE edema, medication SEs, or symptoms of hypotension - Diet: Eats mostly grilled or baked foods.  Rarely eats anything fried but will eat some fried chicken sometimes. - Exercise: Is very active during the day, [facility] right by her house.  Cramps and legs Patient presenting today with follow-up for cramping in her legs bilaterally.  Patient has been seen on 7/5 and 7/17 for the same problem.  Patient says it is not improving despite conservative measures of increasing water intake, drinking tonic water, using supplements and stretching.  Patient says it is mainly in her legs and toes.  Says they occur at nighttime when she is stretching in bed and sometimes at midnight.  Patient says she rubs her legs or move them around to help improve pain.  Pain is improved with movement.  Patient says that only really happens at night and can rarely occur during the day.  Patient says sometimes it feels as if she has a charley horse.   Objective:  BP (!) 150/72   Pulse 95   Temp 98.2 F (36.8 C) (Oral)   Ht 5\' 4"  (1.626 m)   Wt 109 lb 9.6 oz (49.7 kg)   SpO2 98%   BMI 18.81 kg/m  Vitals and nursing note reviewed  General: well nourished, in no acute distress HEENT: normocephalic, moist mucous membranes, good dentition without erythema or discharge noted in posterior oropharynx Neck: supple, non-tender, without lymphadenopathy Cardiac: RRR, clear S1 and S2, no murmurs, rubs, or gallops Respiratory: clear to auscultation bilaterally, no increased work of breathing Abdomen: soft, nontender, nondistended, no masses or organomegaly. Bowel sounds  present Extremities: no edema or cyanosis. Warm, well perfused. 2+ radial and PT pulses bilaterally, no calf tenderness Skin: warm and dry, no rashes noted Neuro: alert and oriented, no focal deficits   Assessment & Plan:    HYPERTENSION, BENIGN SYSTEMIC She continues to have elevated blood pressures today.  Patient says that despite changing medications and adding on a third agent she continues to have elevated blood pressures.  Patient cannot tolerate ACE inhibitors as this causes angioedema.  Discussed blood pressure management with patient.  But patient continues to have healthy diet and exercise daily.  Discussed whether patient would prefer increasing Spironolactone to 50 mg or switching spironolactone to an ARB.  Patient says that she would much rather increase her Spironolactone dose to 50 mg as she has not tolerated other blood pressure medications in the past.  Patient says that she likes the 3 medication she is on now she just wishes that her blood pressure was more controlled.  We will plan to increase Spironolactone to 50 mg daily, Rx sent to pharmacy. -Continue Norvasc 10 mg and hydrochlorothiazide 25 mg -Increase Spironolactone to 25 mg -Follow-up in 1 week with a nursing blood pressure check -Patient states she is going to buy a new blood pressure monitor -Follow-up in 1 month with MD for hypertension management  Cramp of both lower extremities She continues to have bilateral calf and feet cramping.  BMP and calcium within normal limits on previous testing.  Patient continues to have cramping despite increasing fluid intake and drinking tonic water.  Actively restless leg syndrome his symptoms only occur at nighttime and are improved with moving.  Will check ferritin as well as magnesium today to ensure patient is not anemic and having restless leg syndrome or low on magnesium which is causing cramping.  Patient is also on statin which may cause some myalgia.  Will check  CK. -Follow-up if no improvement sooner if worsening  Elevated serum creatinine With elevated creatinine on BMP obtained on 7/5.  Patient has been increasing water intake, says she did not have as much water yesterday.  Will recheck BMP today to ensure that creatinine has normalized.    Return in about 1 week (around 07/21/2018).   Oralia ManisSherin Renley Banwart, DO, PGY-2

## 2018-07-14 NOTE — Assessment & Plan Note (Signed)
With elevated creatinine on BMP obtained on 7/5.  Patient has been increasing water intake, says she did not have as much water yesterday.  Will recheck BMP today to ensure that creatinine has normalized.

## 2018-07-14 NOTE — Assessment & Plan Note (Addendum)
She continues to have bilateral calf and feet cramping.  BMP and calcium within normal limits on previous testing.  Patient continues to have cramping despite increasing fluid intake and drinking tonic water.  Actively restless leg syndrome his symptoms only occur at nighttime and are improved with moving.  Will check ferritin as well as magnesium today to ensure patient is not anemic and having restless leg syndrome or low on magnesium which is causing cramping.  Patient is also on statin which may cause some myalgia.  Will check CK. -Follow-up if no improvement sooner if worsening

## 2018-07-14 NOTE — Patient Instructions (Signed)
It was a pleasure seeing you today.   Today we discussed your blood pressure and leg cramps  For your blood pressure: I have increased your spironolactone to 50 mg.  Please follow-up in 1 week for a nursing visit to check blood pressure.  Please follow-up with me in 1 month to ensure your blood pressure is well controlled.  Your leg cramps: I will be checking labs.  I will either call or send a letter with these results.  Please follow up in 1 week for a nursing blood pressure check or sooner if symptoms persist or worsen. Please call the clinic immediately if you have any concerns.   Our clinic's number is [phone]. Please call with questions or concerns.   Please go to the emergency room if you have shortness of breath, chest pain, vision changes, severe headache.  Thank you,  Oralia ManisSherin Alfonse Garringer, DO

## 2018-07-15 LAB — BASIC METABOLIC PANEL
BUN / CREAT RATIO: 14 (ref 12–28)
BUN: 16 mg/dL (ref 8–27)
CO2: 27 mmol/L (ref 20–29)
CREATININE: 1.18 mg/dL — AB (ref 0.57–1.00)
Calcium: 10.4 mg/dL — ABNORMAL HIGH (ref 8.7–10.3)
Chloride: 98 mmol/L (ref 96–106)
GFR calc non Af Amer: 48 mL/min/{1.73_m2} — ABNORMAL LOW (ref >59)
GFR, EST AFRICAN AMERICAN: 56 mL/min/{1.73_m2} — AB (ref >59)
GLUCOSE: 139 mg/dL — AB (ref 65–99)
Potassium: 4.4 mmol/L (ref 3.5–5.2)
SODIUM: 141 mmol/L (ref 134–144)

## 2018-07-15 LAB — CK: CK TOTAL: 57 U/L (ref 24–173)

## 2018-07-15 LAB — FERRITIN: FERRITIN: 98 ng/mL (ref 15–150)

## 2018-07-15 LAB — MAGNESIUM: Magnesium: 2.4 mg/dL — ABNORMAL HIGH (ref 1.6–2.3)

## 2018-07-19 ENCOUNTER — Other Ambulatory Visit: Payer: Self-pay | Admitting: Family Medicine

## 2018-07-19 MED ORDER — GABAPENTIN 100 MG PO CAPS: 100.0000 mg | ORAL_CAPSULE | Freq: Every day | ORAL | 0 refills | Status: DC

## 2018-07-19 NOTE — Progress Notes (Signed)
Called patient to discuss labs.  During this phone call patient reported that she continues to have muscle cramping despite conservative measures of increasing fluid intake and daily stretching.  Patient says that they are very uncomfortable and sometimes keep her up at night.  Ferritin within normal limits so not iron deficiency anemia leading to restless leg syndrome.  Other electrolytes within normal limits.  Given that patient has failed conservative measures will prescribe low-dose gabapentin, 100 mg, at bedtime.  Will not prescribe diphenhydramine given patient's age.  Called patient and informed her that this medication will be sent to pharmacy.  Patient's creatinine on BMP slowly trending upward.  We will plan to follow this trend and remeasure labs in 1 month.  If labs continue to trend upwards drastically will refer to nephrology but at this time can follow closely at PCP office.  Discussed patient with both Dr. Jennette KettleNeal and Dr. Sheffield SliderHale.    Orpah ClintonSherin Babette Stum, DO, PGY-2 [facility] 07/19/2018 9:27 AM

## 2018-07-20 ENCOUNTER — Other Ambulatory Visit: Payer: Self-pay | Admitting: Family Medicine

## 2018-07-20 DIAGNOSIS — I10 Essential (primary) hypertension: Secondary | ICD-10-CM

## 2018-07-21 ENCOUNTER — Ambulatory Visit: Payer: Medicare HMO

## 2018-07-23 ENCOUNTER — Ambulatory Visit: Payer: Medicare HMO

## 2018-07-23 ENCOUNTER — Telehealth: Payer: Self-pay | Admitting: Family Medicine

## 2018-07-23 ENCOUNTER — Ambulatory Visit (INDEPENDENT_AMBULATORY_CARE_PROVIDER_SITE_OTHER): Payer: Medicare HMO | Admitting: *Deleted

## 2018-07-23 DIAGNOSIS — I10 Essential (primary) hypertension: Secondary | ICD-10-CM

## 2018-07-23 NOTE — Progress Notes (Signed)
Patient here today for BP check.    BP today is 142/70.  Checked BP in left arm with normal cuff.  Symptoms present: none.  Patient last took BP med 8am today and has been taking the increased dose since Sunday.  She is getting money together to get a new BP cuff.  F/u Appt made for 08/10/18 with Dr. Darin EngelsAbraham. Routed note to PCP.    Pamela [name], Pamela RochesterJessica [name], CMA

## 2018-07-23 NOTE — Progress Notes (Signed)
Please let patient know that I have found a coupon code online to receive medication at [facility] for $8.53 for 90 capsules. If she would prefer this I would be happy to send gabapentin to [facility] and she can either print coupon from goodrx.com at home or I can print it for her and leave it at the front desk on Monday.   Jaden Batchelder, DO, PGY-2 [facility] 07/23/2018 3:08 PM 

## 2018-07-23 NOTE — Telephone Encounter (Signed)
Please let patient know that I have found a coupon code online [facility] capsules. If she would prefer this I would be happy to send gabapentin to [facility] and she can either print coupon from goodrx.com at home or I can print it for her and leave it at the front desk on Monday.   Orpah ClintonSherin Ranjit Ashurst, DO, PGY-2 [facility] 07/23/2018 3:08 PM

## 2018-07-23 NOTE — Progress Notes (Signed)
Spoke with pt and she wants RX sent to [facility] [address] And she will come pick up the coupon Monday. Sira Adsit Bruna PotterBlount, CMA

## 2018-07-23 NOTE — Progress Notes (Signed)
Pt is here today for a BP check .  She states that she was unable to pick up the Gabapentin because it was going to cost her $40.  Can you please send in something cheaper. Pamela [name], Maryjo RochesterJessica Dawn, CMA

## 2018-07-26 ENCOUNTER — Other Ambulatory Visit: Payer: Self-pay | Admitting: Family Medicine

## 2018-07-26 MED ORDER — GABAPENTIN 100 MG PO CAPS: 100.0000 mg | ORAL_CAPSULE | Freq: Every day | ORAL | 0 refills | Status: DC

## 2018-07-26 NOTE — Progress Notes (Signed)
Received message that previous RX for gabapentin did not go thru to pharmacy. 2nd RX sent.  Orpah ClintonSherin Kalijah Zeiss, DO, PGY-2 [facility] 07/26/2018 8:49 AM

## 2018-07-26 NOTE — Progress Notes (Signed)
RX re-sent  Oralia ManisSherin Kambryn Dapolito, DO, PGY-2 [facility] 07/26/2018 8:49 AM

## 2018-07-26 NOTE — Progress Notes (Signed)
[facility]. [facility]one [facility] left up front for patient to pick up. Patient aware.

## 2018-08-06 ENCOUNTER — Other Ambulatory Visit: Payer: Self-pay | Admitting: Family Medicine

## 2018-08-06 DIAGNOSIS — I10 Essential (primary) hypertension: Secondary | ICD-10-CM

## 2018-08-10 ENCOUNTER — Ambulatory Visit: Payer: Medicare HMO | Admitting: Family Medicine

## 2018-08-10 ENCOUNTER — Ambulatory Visit: Payer: Medicare HMO

## 2018-08-30 ENCOUNTER — Other Ambulatory Visit: Payer: Self-pay | Admitting: Family Medicine

## 2018-08-30 DIAGNOSIS — I10 Essential (primary) hypertension: Secondary | ICD-10-CM

## 2018-09-02 ENCOUNTER — Other Ambulatory Visit: Payer: Self-pay

## 2018-09-02 ENCOUNTER — Emergency Department (HOSPITAL_COMMUNITY): Payer: Medicare HMO

## 2018-09-02 ENCOUNTER — Emergency Department (HOSPITAL_COMMUNITY)
Admission: EM | Admit: 2018-09-02 | Discharge: 2018-09-02 | Disposition: A | Discharge status: Home / Self Care | Payer: Medicare HMO | Attending: Emergency Medicine | Admitting: Emergency Medicine

## 2018-09-02 ENCOUNTER — Encounter (HOSPITAL_COMMUNITY): Payer: Self-pay | Admitting: *Deleted

## 2018-09-02 DIAGNOSIS — E7800 Pure hypercholesterolemia, unspecified: Secondary | ICD-10-CM | POA: Insufficient documentation

## 2018-09-02 DIAGNOSIS — Z79899 Other long term (current) drug therapy: Secondary | ICD-10-CM | POA: Insufficient documentation

## 2018-09-02 DIAGNOSIS — R0789 Other chest pain: Secondary | ICD-10-CM

## 2018-09-02 DIAGNOSIS — Z7982 Long term (current) use of aspirin: Secondary | ICD-10-CM | POA: Diagnosis not present

## 2018-09-02 DIAGNOSIS — E785 Hyperlipidemia, unspecified: Secondary | ICD-10-CM | POA: Insufficient documentation

## 2018-09-02 DIAGNOSIS — E1122 Type 2 diabetes mellitus with diabetic chronic kidney disease: Secondary | ICD-10-CM | POA: Diagnosis not present

## 2018-09-02 DIAGNOSIS — R079 Chest pain, unspecified: Secondary | ICD-10-CM | POA: Insufficient documentation

## 2018-09-02 DIAGNOSIS — N183 Chronic kidney disease, stage 3 (moderate): Secondary | ICD-10-CM | POA: Diagnosis not present

## 2018-09-02 DIAGNOSIS — I129 Hypertensive chronic kidney disease with stage 1 through stage 4 chronic kidney disease, or unspecified chronic kidney disease: Secondary | ICD-10-CM | POA: Insufficient documentation

## 2018-09-02 DIAGNOSIS — K219 Gastro-esophageal reflux disease without esophagitis: Secondary | ICD-10-CM

## 2018-09-02 LAB — BASIC METABOLIC PANEL
ANION GAP: 11 (ref 5–15)
BUN: 14 mg/dL (ref 8–23)
CHLORIDE: 99 mmol/L (ref 98–111)
CO2: 26 mmol/L (ref 22–32)
Calcium: 9.6 mg/dL (ref 8.9–10.3)
Creatinine, Ser: 1.14 mg/dL — ABNORMAL HIGH (ref 0.44–1.00)
GFR calc Af Amer: 56 mL/min — ABNORMAL LOW (ref >60)
GFR, EST NON AFRICAN AMERICAN: 49 mL/min — AB (ref >60)
GLUCOSE: 186 mg/dL — AB (ref 70–99)
POTASSIUM: 3.8 mmol/L (ref 3.5–5.1)
Sodium: 136 mmol/L (ref 135–145)

## 2018-09-02 LAB — CBC
HEMATOCRIT: 38.5 % (ref 36.0–46.0)
HEMOGLOBIN: 12.5 g/dL (ref 12.0–15.0)
MCH: 32.5 pg (ref 26.0–34.0)
MCHC: 32.5 g/dL (ref 30.0–36.0)
MCV: 100 fL (ref 78.0–100.0)
Platelets: 242 10*3/uL (ref 150–400)
RBC: 3.85 MIL/uL — ABNORMAL LOW (ref 3.87–5.11)
RDW: 12.8 % (ref 11.5–15.5)
WBC: 7 10*3/uL (ref 4.0–10.5)

## 2018-09-02 LAB — I-STAT TROPONIN, ED
TROPONIN I, POC: 0 ng/mL (ref 0.00–0.08)
Troponin i, poc: 0 ng/mL (ref 0.00–0.08)

## 2018-09-02 NOTE — ED Triage Notes (Signed)
Pt in c/o mid CP burning onset today lasting 10 mins with hx of the same just not as intense, denies n/v/d, denies SOB, A&Ox4

## 2018-09-02 NOTE — ED Provider Notes (Signed)
[facility] EMERGENCY DEPARTMENT Provider Note   CSN: [id number] Arrival date & time: 09/02/18  4098     History   Chief Complaint Chief Complaint  Patient presents with  . Chest Pain    HPI Pamela [name] is a 67 y.o. female.  HPI   Ate after taking medication this AM, and about 5-10 minutes later began to have severe burning sensation in the chest, lasted 10 minutes, stayed in middle. Happened 840AM.  While sitting in the waiting room it improved, and have not had it since then.  No dyspnea, sweating, diarrhea, nausea. Did have some palpitations.   Not exertional pain, not pleuritic or positional.  Has had some burning in the past but not like this--not associated with anything in particular, very rare.  Ate tomato yesterday, has hx of reflux. No pain right now.  NO hx of heart problems No cigarettes, no other drugs, occasional red wine Brother has heart disease, wanted to put in defibrillator but didn't have it, hx of stroke, 67yo, no hx of MI, or stent placed.   Past Medical History:  Diagnosis Date  . Abdominal pain, left lower quadrant 07/18/2015   Left Lower Quadrant Abdominal Pain. Patient with hysterectomy in the past. No vaginal bleeding/pain. No complaints of dysuria or incontinence. UA not significant. No constipation or hematochezia. Colonoscopy in 2015 significant for diverticulosis. CT abdomen in 2016 without hernias. No significant weight loss  - Likely diverticulitis - Will start patient Flagyl 100 mg TID and Ciprofloxacin 100 mg   . Anemia   . Anxiety    passing of husband  . Diabetes mellitus without complication (HCC)    metformin  . GERD (gastroesophageal reflux disease)   . Hypercholesteremia   . Hypertension     Patient Active Problem List   Diagnosis Date Noted  . Elevated serum creatinine 07/14/2018  . Cramp of both lower extremities 06/11/2018  . Dysphagia 03/10/2018  . Hx of diabetes mellitus 03/09/2018  . Sleep disorder  10/27/2017  . Osteoporosis 08/12/2017  . Diverticulosis 05/10/2015  . GERD (gastroesophageal reflux disease) 01/15/2015  . Rectal bleeding 11/09/2013  . Hemorrhoids 09/19/2013  . CKD (chronic kidney disease) stage 3, GFR 30-59 ml/min (HCC) 09/18/2013  . Perimenopausal atrophic vaginitis 01/27/2012  . Counseling on health promotion and disease prevention 07/29/2011  . Constipation 12/27/2010  . Controlled diabetes mellitus type II without complication (HCC) 11/06/2009  . HLD (hyperlipidemia) 02/04/2007  . HYPERTENSION, BENIGN SYSTEMIC 02/04/2007    Past Surgical History:  Procedure Laterality Date  . PARTIAL HYSTERECTOMY       OB History   None      Home Medications    Prior to Admission medications   Medication Sig Start Date End Date Taking? Authorizing Provider  acetaminophen (TYLENOL) 500 MG tablet Take 1,000 mg by mouth every 6 (six) hours as needed.   Yes [provider]  alendronate (FOSAMAX) 70 MG tablet Take 1 tablet (70 mg total) by mouth once a week. Take with a full glass of water on an empty stomach. 10/27/17  Yes Mikell, Antionette Poles, MD  amLODipine (NORVASC) 10 MG tablet Take 1 tablet (10 mg total) by mouth daily. 06/11/18  Yes Oralia Manis, DO  aspirin EC 81 MG tablet Take 81 mg by mouth daily.   Yes [provider]  bisacodyl (DULCOLAX) 5 MG EC tablet Take 10 mg by mouth daily as needed for moderate constipation.   Yes [provider]  Calcium Carb-Cholecalciferol (CALCIUM  1000 + D) 1000-800 MG-UNIT TABS Take 1 tablet by mouth daily. 10/27/17  Yes Mikell, Antionette Poles, MD  hydrochlorothiazide (HYDRODIURIL) 25 MG tablet Take 1 tablet (25 mg total) by mouth daily. 06/11/18  Yes Oralia Manis, DO  Multiple Vitamin (MULTIVITAMIN WITH MINERALS) TABS tablet Take 1 tablet by mouth daily.   Yes [provider]  pantoprazole (PROTONIX) 40 MG tablet Take 40 mg by mouth daily. 06/20/18  Yes [provider]  polyethylene glycol  powder (GLYCOLAX/MIRALAX) powder Take 17 g by mouth 2 (two) times daily as needed. 04/09/18  Yes Mikell, Antionette Poles, MD  pravastatin (PRAVACHOL) 20 MG tablet Take 1 tablet (20 mg total) by mouth daily. 10/27/17  Yes Mikell, Antionette Poles, MD  spironolactone (ALDACTONE) 50 MG tablet TAKE 1 TABLET BY MOUTH EVERY DAY 08/30/18  Yes Darin Engels, Sherin, DO  traZODone (DESYREL) 50 MG tablet Take 1 tablet (50 mg total) by mouth at bedtime as needed for sleep. 01/05/18  Yes Palma Holter, MD  Blood Pressure Monitoring (BLOOD PRESSURE CUFF) MISC 1 Blood pressure cuff 05/13/18   Mikell, Antionette Poles, MD  gabapentin (NEURONTIN) 100 MG capsule Take 1 capsule (100 mg total) by mouth at bedtime. Patient not taking: Reported on 09/02/2018 07/26/18   Oralia Manis, DO  glucose blood test strip Check blood sugar once daily fasting 07/08/16   Mikell, Antionette Poles, MD  pantoprazole (PROTONIX) 20 MG tablet Take 1 tablet (20 mg total) by mouth daily. Patient not taking: Reported on 09/02/2018 04/09/18   Berton Bon, MD    Family History Family History  Problem Relation Age of Onset  . Heart disease Father   . Diabetes Sister   . Diabetes Brother   . Heart disease Brother   . Hyperlipidemia Brother   . Hypertension Brother     Social History Social History   Tobacco Use  . Smoking status: Never Smoker  . Smokeless tobacco: Former Neurosurgeon    Types: Snuff  Substance Use Topics  . Alcohol use: No  . Drug use: Not on file     Allergies   Lisinopril   Review of Systems Review of Systems  Constitutional: Negative for fever.  HENT: Negative for sore throat.   Eyes: Negative for visual disturbance.  Respiratory: Negative for cough and shortness of breath.   Cardiovascular: Positive for chest pain.  Gastrointestinal: Negative for abdominal pain, constipation, diarrhea, nausea and vomiting.  Genitourinary: Negative for difficulty urinating and dysuria.  Musculoskeletal: Negative for back pain and  neck pain.  Skin: Negative for rash.  Neurological: Negative for syncope and headaches.     Physical Exam Updated Vital Signs BP 130/72   Pulse 78   Temp 98 F (36.7 C) (Oral)   Resp (!) 26   Ht 5\' 3"  (1.6 m)   Wt 52.2 kg   SpO2 100%   BMI 20.37 kg/m   Physical Exam  Constitutional: She is oriented to person, place, and time. She appears well-developed and well-nourished. No distress.  HENT:  Head: Normocephalic and atraumatic.  Eyes: Conjunctivae and EOM are normal.  Neck: Normal range of motion.  Cardiovascular: Normal rate, regular rhythm, normal heart sounds and intact distal pulses. Exam reveals no gallop and no friction rub.  No murmur heard. Pulmonary/Chest: Effort normal and breath sounds normal. No respiratory distress. She has no wheezes. She has no rales.  Abdominal: Soft. She exhibits no distension. There is no tenderness. There is no guarding.  Musculoskeletal: She exhibits no edema or tenderness.  Neurological: She is alert and oriented to person, place, and time.  Skin: Skin is warm and dry. No rash noted. She is not diaphoretic. No erythema.  Nursing note and vitals reviewed.    ED Treatments / Results  Labs (all labs ordered are listed, but only abnormal results are displayed) Labs Reviewed  BASIC METABOLIC PANEL - Abnormal; Notable for the following components:      Result Value   Glucose, Bld 186 (*)    Creatinine, Ser 1.14 (*)    GFR calc non Af Amer 49 (*)    GFR calc Af Amer 56 (*)    All other components within normal limits  CBC - Abnormal; Notable for the following components:   RBC 3.85 (*)    All other components within normal limits  I-STAT TROPONIN, ED  I-STAT TROPONIN, ED    EKG EKG Interpretation  Date/Time:  Thursday September 02 2018 10:16:11 EDT Ventricular Rate:  82 PR Interval:  154 QRS Duration: 80 QT Interval:  376 QTC Calculation: 439 R Axis:   78 Text Interpretation:  Normal sinus rhythm Normal ECG Since prior  ECG, TW inversions have now improved Confirmed by Alvira Monday ([id number]) on 09/02/2018 12:37:20 PM   Radiology Dg Chest 2 View  Result Date: 09/02/2018 CLINICAL DATA:  Chest burning EXAM: CHEST - 2 VIEW COMPARISON:  05/28/2015 FINDINGS: Heart and mediastinal contours are within normal limits. No focal opacities or effusions. No acute bony abnormality. IMPRESSION: No active cardiopulmonary disease. Electronically Signed   By: Charlett Nose M.D.   On: 09/02/2018 11:09    Procedures Procedures (including critical care time)  Medications Ordered in ED Medications - No data to display   Initial Impression / Assessment and Plan / ED Course  I have reviewed the triage vital signs and the nursing notes.  Pertinent labs & imaging results that were available during my care of the patient were reviewed by me and considered in my medical decision making (see chart for details).     67yo female with history of DM, hypertension, hypercholesterolemia presents with burning in the chest lasting 10 minutes after eating today.  She has not had any other chest pain since this episode and since it resolved.  Have low suspicion for pulmonary embolus, aortic dissection, pneumothorax, pneumonia by history, physical, chest x-ray.  Patient without hypoxia, no tachycardia, no tachypnea, no asymmetric leg swelling.  EKG without acute findings.  Troponin negative x2.  She denies any exertional symptoms prior to development of burning today, and do not feel inpatient cardiac evaluation given atypical symptoms is indicated.  Overall, burning pain after eating with self resolution may be GERD, however given her risk factors for cardiac disease, recommend outpatient cardiology follow-up.  Patient discharged in stable condition with understanding of reasons to return.    Final Clinical Impressions(s) / ED Diagnoses   Final diagnoses:  Burning chest pain    ED Discharge Orders    None       Alvira Monday,  MD 09/02/18 1540

## 2018-09-02 NOTE — ED Notes (Signed)
Patient was given a Ginger Ale w/ Cup of Ice. 

## 2018-09-03 ENCOUNTER — Ambulatory Visit: Payer: Medicare HMO

## 2018-09-08 ENCOUNTER — Ambulatory Visit
Admission: RE | Admit: 2018-09-08 | Discharge: 2018-09-08 | Disposition: A | Discharge status: Home / Self Care | Payer: Medicare HMO | Source: Ambulatory Visit | Attending: *Deleted | Admitting: *Deleted

## 2018-09-08 DIAGNOSIS — Z1231 Encounter for screening mammogram for malignant neoplasm of breast: Secondary | ICD-10-CM

## 2018-09-14 ENCOUNTER — Ambulatory Visit: Payer: Medicare HMO

## 2018-09-16 ENCOUNTER — Other Ambulatory Visit: Payer: Self-pay

## 2018-09-16 ENCOUNTER — Ambulatory Visit (INDEPENDENT_AMBULATORY_CARE_PROVIDER_SITE_OTHER): Payer: Medicare HMO

## 2018-09-16 VITALS — BP 140/80 | HR 72 | Temp 98.0°F | Ht 63.0 in | Wt 109.2 lb

## 2018-09-16 DIAGNOSIS — M899 Disorder of bone, unspecified: Secondary | ICD-10-CM

## 2018-09-16 DIAGNOSIS — M949 Disorder of cartilage, unspecified: Secondary | ICD-10-CM

## 2018-09-16 DIAGNOSIS — Z23 Encounter for immunization: Secondary | ICD-10-CM | POA: Diagnosis not present

## 2018-09-16 DIAGNOSIS — G479 Sleep disorder, unspecified: Secondary | ICD-10-CM

## 2018-09-16 DIAGNOSIS — Z0000 Encounter for general adult medical examination without abnormal findings: Secondary | ICD-10-CM | POA: Diagnosis not present

## 2018-09-16 MED ORDER — CALCIUM CARB-CHOLECALCIFEROL 1000-800 MG-UNIT PO TABS: 1.0000 | ORAL_TABLET | Freq: Every day | ORAL | 3 refills | Status: DC

## 2018-09-16 MED ORDER — TRAZODONE HCL 50 MG PO TABS: 50.0000 mg | ORAL_TABLET | Freq: Every evening | ORAL | 0 refills | Status: DC | PRN

## 2018-09-16 NOTE — Progress Notes (Signed)
Subjective:   Pamela [name] is a 67 y.o. female who presents for an Initial Medicare Annual Wellness Visit. The patient was informed that the wellness visit is to identify future health risk and educate and initiate measures that can reduce risk for increased disease through the lifespan.   Review of Systems    Physical assessment deferred to PCP.  Cardiac Risk Factors include: advanced age (>28men, >46 women);dyslipidemia;hypertension    Objective:    Today's Vitals   09/16/18 1627  BP: 140/80  Pulse: 72  Temp: 98 F (36.7 C)  TempSrc: Oral  SpO2: 98%  Weight: 109 lb 3.2 oz (49.5 kg)  Height: 5\' 3"  (1.6 m)  PainSc: 0-No pain   Body mass index is 19.34 kg/m.  Advanced Directives 09/16/2018 09/02/2018 07/14/2018 06/23/2018 05/24/2018 04/23/2018 01/28/2018  Does Patient Have a Medical Advance Directive? No No No No No No No  Type of Advance Directive - - - - - - -  Copy of Healthcare Power of Attorney in Chart? - - - - - - -  Would patient like information on creating a medical advance directive? No - Patient declined - No - Patient declined No - Patient declined No - Patient declined No - Patient declined No - Patient declined  Pre-existing out of facility DNR order (yellow form or pink MOST form) - - - - - - -    Current Medications (verified) Outpatient Encounter Medications as of 09/16/2018  Medication Sig  . acetaminophen (TYLENOL) 500 MG tablet Take 1,000 mg by mouth every 6 (six) hours as needed.  Marland Kitchen alendronate (FOSAMAX) 70 MG tablet Take 1 tablet (70 mg total) by mouth once a week. Take with a full glass of water on an empty stomach.  Marland Kitchen amLODipine (NORVASC) 10 MG tablet Take 1 tablet (10 mg total) by mouth daily.  Marland Kitchen aspirin EC 81 MG tablet Take 81 mg by mouth daily.  . bisacodyl (DULCOLAX) 5 MG EC tablet Take 10 mg by mouth daily as needed for moderate constipation.  . Blood Pressure Monitoring (BLOOD PRESSURE CUFF) MISC 1 Blood pressure cuff  . Calcium  Carb-Cholecalciferol (CALCIUM 1000 + D) 1000-800 MG-UNIT TABS Take 1 tablet by mouth daily.  . hydrochlorothiazide (HYDRODIURIL) 25 MG tablet Take 1 tablet (25 mg total) by mouth daily.  . Multiple Vitamin (MULTIVITAMIN WITH MINERALS) TABS tablet Take 1 tablet by mouth daily.  . pantoprazole (PROTONIX) 20 MG tablet Take 1 tablet (20 mg total) by mouth daily.  . polyethylene glycol powder (GLYCOLAX/MIRALAX) powder Take 17 g by mouth 2 (two) times daily as needed.  . pravastatin (PRAVACHOL) 20 MG tablet Take 1 tablet (20 mg total) by mouth daily.  Marland Kitchen spironolactone (ALDACTONE) 50 MG tablet TAKE 1 TABLET BY MOUTH EVERY DAY  . traZODone (DESYREL) 50 MG tablet Take 1 tablet (50 mg total) by mouth at bedtime as needed for sleep.  Marland Kitchen gabapentin (NEURONTIN) 100 MG capsule Take 1 capsule (100 mg total) by mouth at bedtime. (Patient not taking: Reported on 09/02/2018)  . glucose blood test strip Check blood sugar once daily fasting  . pantoprazole (PROTONIX) 40 MG tablet Take 40 mg by mouth daily.   No facility-administered encounter medications on file as of 09/16/2018.    Allergies (verified) Lisinopril   History: Past Medical History:  Diagnosis Date  . Abdominal pain, left lower quadrant 07/18/2015   Left Lower Quadrant Abdominal Pain. Patient with hysterectomy in the past. No vaginal bleeding/pain. No complaints of dysuria or  incontinence. UA not significant. No constipation or hematochezia. Colonoscopy in 2015 significant for diverticulosis. CT abdomen in 2016 without hernias. No significant weight loss  - Likely diverticulitis - Will start patient Flagyl 100 mg TID and Ciprofloxacin 100 mg   . Anemia   . Anxiety    passing of husband  . Diabetes mellitus without complication (HCC)    metformin  . GERD (gastroesophageal reflux disease)   . Hypercholesteremia   . Hypertension    Past Surgical History:  Procedure Laterality Date  . PARTIAL HYSTERECTOMY     Family History  Problem Relation  Age of Onset  . Heart disease Father   . Diabetes Sister   . Diabetes Brother   . Heart disease Brother   . Hyperlipidemia Brother   . Hypertension Brother   . Hypertension Sister    Social History   Socioeconomic History  . Marital status: Widowed    Spouse name: Not on file  . Number of children: 3  . Years of education: 30  . Highest education level: 11th grade  Occupational History    Employer: UNEMPLOYED  Social Needs  . Financial resource strain: Not hard at all  . Food insecurity:    Worry: Never true    Inability: Never true  . Transportation needs:    Medical: No    Non-medical: No  Tobacco Use  . Smoking status: Never Smoker  . Smokeless tobacco: Former Neurosurgeon    Types: Snuff  Substance and Sexual Activity  . Alcohol use: No    Comment: occasional red wine  . Drug use: Never  . Sexual activity: Not Currently    Birth control/protection: Condom  Lifestyle  . Physical activity:    Days per week: 1 day    Minutes per session: 30 min  . Stress: Not at all  Relationships  . Social connections:    Talks on phone: More than three times a week    Gets together: Twice a week    Attends religious service: More than 4 times per year    Active member of club or organization: No    Attends meetings of clubs or organizations: Never    Relationship status: Widowed  Other Topics Concern  . Not on file  Social History Narrative   Lives in own house, brother lives with her. One level house. Step with handrail on front porch. No pets.   Smoke alarms in house, no grab bars. No throw rugs.       Hobbies include shopping, cook outs, visit family      Diet includes meat, veg, and fruit. Drinks water, Pepsi, and juice.   Tobacco Counseling Counseling given: Yes  Clinical Intake:  Pre-visit preparation completed: No  Pain : No/denies pain Pain Score: 0-No pain   Nutritional Status: BMI of 19-24  Normal Diabetes: No  How often do you need to have someone help  you when you read instructions, pamphlets, or other written materials from your doctor or pharmacy?: 3 - Sometimes What is the last grade level you completed in school?: 11th grade  Interpreter Needed?: No    Activities of Daily Living In your present state of health, do you have any difficulty performing the following activities: 09/16/2018  Hearing? N  Vision? N  Difficulty concentrating or making decisions? N  Walking or climbing stairs? N  Dressing or bathing? N  Doing errands, shopping? N  Preparing Food and eating ? N  Using the Toilet? N  In  the past six months, have you accidently leaked urine? N  Do you have problems with loss of bowel control? N  Managing your Medications? N  Managing your Finances? N  Some recent data might be hidden   Immunizations and Health Maintenance Immunization History  Administered Date(s) Administered  . Influenza Split 09/22/2011, 09/24/2012  . Influenza,inj,Quad PF,6+ Mos 09/20/2013, 09/11/2014, 10/02/2015, 08/26/2016, 08/11/2017, 09/16/2018  . Pneumococcal Conjugate-13 05/25/2017  . Td 08/08/2002  . Tdap 03/07/2013, 07/08/2016   Flu vaccine given today. Patient declined Pneumovax.  There are no preventive care reminders to display for this patient.  Patient Care Team: Oralia Manis, DO as PCP - General (Family Medicine)  Indicate any recent Medical Services you may have received from other than [facility] providers in the past year (date may be approximate).     Assessment:   This is a routine wellness examination for Nicosha.  Hearing/Vision screen  Hearing Screening   Method: Audiometry   125Hz  250Hz  500Hz  1000Hz  2000Hz  3000Hz  4000Hz  6000Hz  8000Hz   Right ear:  Pass Pass Pass Pass  Pass    Left ear:  Pass Pass Pass Pass  Pass    Vision Screening Comments: Eye exam during summer. Does not wear glasses.   Dietary issues and exercise activities discussed: Current Exercise Habits: The patient does not participate in regular exercise  at present  Goals    . Gain weight      Depression Screen PHQ 2/9 Scores 09/16/2018 07/14/2018 06/23/2018 05/24/2018 04/23/2018 03/09/2018 01/28/2018  PHQ - 2 Score 0 0 0 0 0 0 0    Fall Risk Fall Risk  09/16/2018 07/14/2018 06/23/2018 05/24/2018 04/23/2018  Falls in the past year? No No No No No    Is the patient's home free of loose throw rugs in walkways, pet beds, electrical cords, etc?   yes      Grab bars in the bathroom? no      Handrails on the stairs?   yes      Adequate lighting?   yes  Cognitive Function: MMSE - Mini Mental State Exam 09/16/2018  Orientation to time 5  Orientation to Place 5  Registration 3  Attention/ Calculation 5  Recall 3  Language- name 2 objects 2  Language- repeat 1  Language- follow 3 step command 3  Language- read & follow direction 1  Write a sentence 1  Copy design 1  Total score 30     6CIT Screen 09/16/2018  What Year? 0 points  What month? 0 points  What time? 0 points  Count back from 20 0 points  Months in reverse 0 points  Repeat phrase 0 points  Total Score 0    Screening Tests Health Maintenance  Topic Date Due  . URINE MICROALBUMIN  10/14/2018 (Originally 05/09/2016)  . PNA vac Low Risk Adult (2 of 2 - PPSV23) 09/17/2019 (Originally 05/25/2018)  . OPHTHALMOLOGY EXAM  10/19/2018  . HEMOGLOBIN A1C  12/24/2018  . FOOT EXAM  01/28/2019  . MAMMOGRAM  09/08/2020  . COLONOSCOPY  01/04/2024  . TETANUS/TDAP  07/08/2026  . INFLUENZA VACCINE  Completed  . DEXA SCAN  Completed  . Hepatitis C Screening  Completed    Cancer Screenings: Lung: Low Dose CT Chest recommended if Age 15-80 years, 30 pack-year currently smoking OR have quit w/in 15years. Patient does not qualify. Breast: Up to date on Mammogram? Yes   Up to date of Bone Density/Dexa? Yes Colorectal: yes   Additional Screenings:  Hepatitis C Screening: complete  Patient request FOBT      Plan:  Flu vaccine completed today. Patient declined Pneumovax. Patient would  like FOBT at direction of Chief of Staff. Colonoscopy is up to date. Patient needs two medication refills. Sent to PCP on different encounter. F/U appt with PCP on 10/14/18.  I have personally reviewed and noted the following in the patient's chart:   . Medical and social history . Use of alcohol, tobacco or illicit drugs  . Current medications and supplements . Functional ability and status . Nutritional status . Physical activity . Advanced directives . List of other physicians . Hospitalizations, surgeries, and ER visits in previous 12 months . Vitals . Screenings to include cognitive, depression, and falls . Referrals and appointments  In addition, I have reviewed and discussed with patient certain preventive protocols, quality metrics, and best practice recommendations. A written personalized care plan for preventive services as well as general preventive health recommendations were provided to patient.     Nigel Mormon, RN   09/16/2018

## 2018-09-16 NOTE — Patient Instructions (Signed)
[name] , Thank you for taking time to come for your Medicare Wellness Visit. I appreciate your ongoing commitment to your health goals. Please review the following plan we discussed and let me know if I can assist you in the future.   Your flu vaccine was given today. Please keep appt with PCP on 10/14/18 and consider pneumonia vaccine.  These are the goals we discussed: Goals    . Gain weight       This is a list of the screening recommended for you and due dates:  Health Maintenance  Topic Date Due  . Urine Protein Check  10/14/2018*  . Pneumonia vaccines (2 of 2 - PPSV23) 09/17/2019*  . Eye exam for diabetics  10/19/2018  . Hemoglobin A1C  12/24/2018  . Complete foot exam   01/28/2019  . Mammogram  09/08/2020  . Colon Cancer Screening  01/04/2024  . Tetanus Vaccine  07/08/2026  . Flu Shot  Completed  . DEXA scan (bone density measurement)  Completed  .  Hepatitis C: One time screening is recommended by Center for Disease Control  (CDC) for  adults born from [birth year] through 1965.   Completed  *Topic was postponed. The date shown is not the original due date.    High-Protein and High-Calorie Diet Eating high-protein and high-calorie foods can help you to gain weight, heal after an injury, and recover after an illness or surgery. What is my plan? The specific amount of daily protein and calories you need depends on:  Your body weight.  The reason this diet is recommended for you.  Generally, a high-protein, high-calorie diet involves:  Eating 250-500 extra calories each day.  Making sure that 10-35% of your daily calories come from protein.  Talk to your health care provider about how much protein and how many calories you need each day. Follow the diet as directed by your health care provider. What do I need to know about this diet?  Ask your health care provider if you should take a nutritional supplement.  Try to eat six small meals each day instead of three large  meals.  Eat a balanced diet, including one food that is high in protein at each meal.  Keep nutritious snacks handy, such as nuts, trail mixes, dried fruit, and yogurt.  If you have kidney disease or diabetes, eating too much protein may put extra stress on your kidneys. Talk to your health care provider if you have either of those conditions. What are some high-protein foods? Grains Quinoa. Bulgur wheat. Vegetables Soybeans. Peas. Meats and Other Protein Sources Beef, pork, and poultry. Fish and seafood. Eggs. Tofu. Textured vegetable protein (TVP). Peanut butter. Nuts and seeds. Dried beans. Protein powders. Dairy Whole milk. Whole-milk yogurt. Powdered milk. Cheese. [employer]. Eggnog. Beverages High-protein supplement drinks. Soy milk. Other Protein bars. The items listed above may not be a complete list of recommended foods or beverages. Contact your dietitian for more options. What are some high-calorie foods? Grains Pasta. Quick breads. Muffins. Pancakes. Ready-to-eat cereal. Vegetables Vegetables cooked in oil or butter. Fried potatoes. Fruits Dried fruit. Fruit leather. Canned fruit in syrup. Fruit juice. Avocados. Meats and Other Protein Sources Peanut butter. Nuts and seeds. Dairy Heavy cream. Whipped cream. Cream cheese. Sour cream. Ice cream. Custard. Pudding. Beverages Meal-replacement beverages. Nutrition shakes. Fruit juice. Sugar-sweetened soft drinks. Condiments Salad dressing. Mayonnaise. Alfredo sauce. Fruit preserves or jelly. Honey. Syrup. Sweets/Desserts Cake. Cookies. Pie. Pastries. Candy bars. Chocolate. Fats and Oils Butter or margarine.  Oil. Gravy. Other Meal-replacement bars. The items listed above may not be a complete list of recommended foods or beverages. Contact your dietitian for more options. What are some tips for including high-protein and high-calorie foods in my diet?  Add whole milk, half-and-half, or heavy cream to cereal,  pudding, soup, or hot cocoa.  Add whole milk to instant breakfast drinks.  Add peanut butter to oatmeal or smoothies.  Add powdered milk to baked goods, smoothies, or milkshakes.  Add powdered milk, cream, or butter to mashed potatoes.  Add cheese to cooked vegetables.  Make whole-milk yogurt parfaits. Top them with granola, fruit, or nuts.  Add cottage cheese to your fruit.  Add avocados, cheese, or both to sandwiches or salads.  Add meat, poultry, or seafood to rice, pasta, casseroles, salads, and soups.  Use mayonnaise when making egg salad, chicken salad, or tuna salad.  Use peanut butter as a topping for pretzels, celery, or crackers.  Add beans to casseroles, dips, and spreads.  Add pureed beans to sauces and soups.  Replace calorie-free drinks with calorie-containing drinks, such as milk and fruit juice. This information is not intended to replace advice given to you by your health care provider. Make sure you discuss any questions you have with your health care provider. Document Released: 11/24/2005 Document Revised: 05/01/2016 Document Reviewed: 05/09/2014 Elsevier Interactive Patient Education  [employer].

## 2018-09-20 NOTE — Progress Notes (Signed)
FOBT ordered  Oralia Manis, DO, PGY-2 [facility] 09/20/2018 7:55 PM

## 2018-09-30 ENCOUNTER — Other Ambulatory Visit: Payer: Self-pay | Admitting: Family Medicine

## 2018-09-30 DIAGNOSIS — I10 Essential (primary) hypertension: Secondary | ICD-10-CM

## 2018-10-09 NOTE — Progress Notes (Signed)
   Subjective:    Patient ID: Pamela [name], female    DOB: 01-28-1951, 67 y.o.   MRN: [id number]   CC: HTN f/u  HPI: Hypertension: - Medications: Norvasc 10mg , Spironolactone 50mg , HCTZ 25mg   - Compliance: good - Checking BP at home: no, does not have BP cuff at home but would like one - Denies any SOB, CP, vision changes, LE edema, medication SEs, or symptoms of hypotension - Diet: Patient states she tries to eat healthy, eat smaller more frequent meals due to her GERD.  Eats lots of greens, sweet potatoes, baked/boiled chicken, rarely eats fried chicken and if she does she removes the skin - Exercise: Walks daily, yardwork daily  Leg cramps Patient reports improvement with her leg cramps.  Denies any nighttime leg cramps.  Only has occasional daytime leg cramps now.  States that she has not used her gabapentin as a Advertising account planner who came to her house told her it was bad for her.  States that she has been trying to eat right and exercise daily and this improved her leg cramping.  Like to have gabapentin removed off her medication list.   Objective:  BP (!) 146/70 (BP Location: Left Arm, Patient Position: Sitting, Cuff Size: Normal)   Pulse 86   Temp 97.9 F (36.6 C) (Oral)   Ht 5\' 4"  (1.626 m)   Wt 110 lb (49.9 kg)   SpO2 100%   BMI 18.88 kg/m  Vitals and nursing note reviewed  General: well nourished, in no acute distress HEENT: normocephalic, moist mucous membranes  Cardiac: RRR, clear S1 and S2, no murmurs, rubs, or gallops Respiratory: clear to auscultation bilaterally, no increased work of breathing Abdomen: soft, nontender, nondistended, no masses or organomegaly. Bowel sounds present Extremities: no edema or cyanosis. Warm, well perfused.   Skin: warm and dry, no rashes noted Neuro: alert and oriented, no focal deficits   Assessment & Plan:    HYPERTENSION, BENIGN SYSTEMIC Patient with blood pressures almost at goal with new medication plan. Last 3 blood  pressure readings recorded in EMR are as follows 137/60> 140/80> 146/70.  Given that blood pressure readings are essentially within goal of 140/90 will plan to continue current medication regimen.  Patient without any side effects.  Patient is very happy that her blood pressures are now better controlled. Given that patient is on spironolactone will need to check electrolytes today. Will obtain BMP. BP cuff ordered, script for RX given to patient to take to pharmacy/medical supply store.  -follow up in 3 months -continue norvasc 10mg , HCTZ 25mg , spironolactone 50mg    Cramp of both lower extremities Improved with diet and exercise. Gabapentin discontinued. Encouraged healthy diet and exercise as well as daily stretching and hydration.  -follow up as needed   Counseling on health promotion and disease prevention Cologuard ordered today per patient request    Return in about 3 months (around 01/14/2019).   Oralia Manis, DO, PGY-2

## 2018-10-14 ENCOUNTER — Encounter: Payer: Self-pay | Admitting: Family Medicine

## 2018-10-14 ENCOUNTER — Ambulatory Visit (INDEPENDENT_AMBULATORY_CARE_PROVIDER_SITE_OTHER): Payer: Medicare HMO | Admitting: Family Medicine

## 2018-10-14 ENCOUNTER — Other Ambulatory Visit: Payer: Self-pay

## 2018-10-14 VITALS — BP 146/70 | HR 86 | Temp 97.9°F | Ht 64.0 in | Wt 110.0 lb

## 2018-10-14 DIAGNOSIS — Z1211 Encounter for screening for malignant neoplasm of colon: Secondary | ICD-10-CM | POA: Diagnosis not present

## 2018-10-14 DIAGNOSIS — I10 Essential (primary) hypertension: Secondary | ICD-10-CM | POA: Diagnosis not present

## 2018-10-14 DIAGNOSIS — Z7189 Other specified counseling: Secondary | ICD-10-CM

## 2018-10-14 DIAGNOSIS — R252 Cramp and spasm: Secondary | ICD-10-CM | POA: Diagnosis not present

## 2018-10-14 MED ORDER — BLOOD PRESSURE CUFF MISC: 1.0000 | Freq: Every day | 0 refills | Status: AC

## 2018-10-14 NOTE — Assessment & Plan Note (Addendum)
Patient with blood pressures almost at goal with new medication plan. Last 3 blood pressure readings recorded in EMR are as follows 137/60> 140/80> 146/70.  Given that blood pressure readings are essentially within goal of 140/90 will plan to continue current medication regimen.  Patient without any side effects.  Patient is very happy that her blood pressures are now better controlled. Given that patient is on spironolactone will need to check electrolytes today. Will obtain BMP. BP cuff ordered, script for RX given to patient to take to pharmacy/medical supply store.  -follow up in 3 months -continue norvasc 10mg , HCTZ 25mg , spironolactone 50mg 

## 2018-10-14 NOTE — Patient Instructions (Signed)
It was a pleasure seeing you today.   Today we discussed your blood pressure  For your blood pressure: continue your current medications. Try to eat right and exercise daily. I have ordered a blood pressure cuff so you can check your BP at home.   I have ordered your cologuard. This will be sent to your house with instructions.   Please follow up in 3 months or sooner if symptoms persist or worsen. Please call the clinic immediately if you have any concerns.   Our clinic's number is [phone]. Please call with questions or concerns.   Please go to the emergency room if chest pain or shortness of breath  Thank you,  Oralia Manis, DO

## 2018-10-14 NOTE — Assessment & Plan Note (Signed)
Cologuard ordered today per patient request

## 2018-10-14 NOTE — Assessment & Plan Note (Signed)
Improved with diet and exercise. Gabapentin discontinued. Encouraged healthy diet and exercise as well as daily stretching and hydration.  -follow up as needed

## 2018-10-15 LAB — BASIC METABOLIC PANEL
BUN / CREAT RATIO: 13 (ref 12–28)
BUN: 15 mg/dL (ref 8–27)
CHLORIDE: 94 mmol/L — AB (ref 96–106)
CO2: 26 mmol/L (ref 20–29)
Calcium: 10.4 mg/dL — ABNORMAL HIGH (ref 8.7–10.3)
Creatinine, Ser: 1.14 mg/dL — ABNORMAL HIGH (ref 0.57–1.00)
GFR calc Af Amer: 57 mL/min/{1.73_m2} — ABNORMAL LOW (ref >59)
GFR calc non Af Amer: 50 mL/min/{1.73_m2} — ABNORMAL LOW (ref >59)
GLUCOSE: 116 mg/dL — AB (ref 65–99)
POTASSIUM: 4.9 mmol/L (ref 3.5–5.2)
SODIUM: 136 mmol/L (ref 134–144)

## 2018-10-27 ENCOUNTER — Telehealth: Payer: Self-pay | Admitting: *Deleted

## 2018-10-27 NOTE — Telephone Encounter (Signed)
If patient has already been seen by practice she will not need a referral, she can just call the office and schedule an appointment. After she calls if a referral is needed I will be happy to send one in.  Orpah ClintonSherin Lidya Mccalister, DO, PGY-2 [facility] 10/27/2018 2:31 PM

## 2018-10-27 NOTE — Telephone Encounter (Signed)
Pt is requesting a referral back to Dr. Arlyce DiceKaplan.  She is having digestive issues.  She endorses feeling like her food gets stuck and constipation/diarrhea.  Sometimes needs to take laxatives. , Maryjo RochesterJessica Dawn, CMA

## 2018-10-27 NOTE — Telephone Encounter (Signed)
Patient says it has been so long since she's been seen she would rather have a referral.  Ples SpecterAlisa Brake, RN [facility] RN)

## 2018-10-27 NOTE — Telephone Encounter (Signed)
Patient still would like a referral placed as it has been a while since she has been to see Dr. Arlyce DiceKaplan.  Pamela [name].Thomasa Heidler R, CMA

## 2018-10-28 ENCOUNTER — Other Ambulatory Visit: Payer: Self-pay | Admitting: Family Medicine

## 2018-10-28 DIAGNOSIS — K5900 Constipation, unspecified: Secondary | ICD-10-CM

## 2018-10-28 NOTE — Telephone Encounter (Signed)
Referral placed  Oralia ManisSherin Ala Kratz, DO, PGY-2 [facility] 10/28/2018 7:43 AM

## 2018-10-28 NOTE — Telephone Encounter (Signed)
Informed patient that referral has been placed.  Glennie Hawk.Leotis Isham R, CMA

## 2018-10-29 ENCOUNTER — Other Ambulatory Visit: Payer: Self-pay | Admitting: *Deleted

## 2018-10-29 MED ORDER — PANTOPRAZOLE SODIUM 40 MG PO TBEC: 40.0000 mg | DELAYED_RELEASE_TABLET | Freq: Every day | ORAL | 3 refills | Status: DC

## 2018-10-30 ENCOUNTER — Other Ambulatory Visit: Payer: Self-pay | Admitting: Family Medicine

## 2018-10-30 DIAGNOSIS — I10 Essential (primary) hypertension: Secondary | ICD-10-CM

## 2018-10-31 ENCOUNTER — Other Ambulatory Visit: Payer: Self-pay | Admitting: Family Medicine

## 2018-10-31 DIAGNOSIS — I10 Essential (primary) hypertension: Secondary | ICD-10-CM

## 2018-11-01 ENCOUNTER — Encounter: Payer: Self-pay | Admitting: Gastroenterology

## 2018-11-01 ENCOUNTER — Telehealth: Payer: Self-pay | Admitting: Family Medicine

## 2018-11-01 ENCOUNTER — Other Ambulatory Visit: Payer: Self-pay | Admitting: Family Medicine

## 2018-11-01 DIAGNOSIS — K5900 Constipation, unspecified: Secondary | ICD-10-CM

## 2018-11-01 MED ORDER — POLYETHYLENE GLYCOL 3350 17 GM/SCOOP PO POWD: 17.0000 g | Freq: Two times a day (BID) | ORAL | 1 refills | Status: DC | PRN

## 2018-11-01 NOTE — Telephone Encounter (Signed)
Pt would like to know if she can have a stool softer called into her pharmacy. She said she used to be on one but is now having issues using the restroom again.

## 2018-11-01 NOTE — Telephone Encounter (Signed)
RX for miralax refilled  Oralia ManisSherin Senie Lanese, DO, PGY-2 [facility] 11/01/2018 1:27 PM

## 2018-11-03 ENCOUNTER — Other Ambulatory Visit: Payer: Self-pay

## 2018-11-03 MED ORDER — PRAVASTATIN SODIUM 20 MG PO TABS: 20.0000 mg | ORAL_TABLET | Freq: Every day | ORAL | 3 refills | Status: DC

## 2018-11-23 ENCOUNTER — Ambulatory Visit: Payer: Medicare HMO | Admitting: Family Medicine

## 2018-11-25 ENCOUNTER — Ambulatory Visit: Payer: Medicare HMO | Admitting: Gastroenterology

## 2018-11-25 ENCOUNTER — Encounter: Payer: Self-pay | Admitting: Gastroenterology

## 2018-11-25 ENCOUNTER — Encounter (INDEPENDENT_AMBULATORY_CARE_PROVIDER_SITE_OTHER): Payer: Self-pay

## 2018-11-25 VITALS — BP 146/70 | HR 78 | Ht 64.5 in | Wt 110.0 lb

## 2018-11-25 DIAGNOSIS — Z1211 Encounter for screening for malignant neoplasm of colon: Secondary | ICD-10-CM | POA: Diagnosis not present

## 2018-11-25 DIAGNOSIS — K219 Gastro-esophageal reflux disease without esophagitis: Secondary | ICD-10-CM

## 2018-11-25 DIAGNOSIS — R1310 Dysphagia, unspecified: Secondary | ICD-10-CM

## 2018-11-25 DIAGNOSIS — K5900 Constipation, unspecified: Secondary | ICD-10-CM | POA: Diagnosis not present

## 2018-11-25 NOTE — Patient Instructions (Addendum)
If you are age 67 or older, your body mass index should be between 23-30. Your Body mass index is 18.59 kg/m. If this is out of the aforementioned range listed, please consider follow up with your Primary Care Provider.  If you are age 67 or younger, your body mass index should be between 19-25. Your Body mass index is 18.59 kg/m. If this is out of the aformentioned range listed, please consider follow up with your Primary Care Provider.   You have been scheduled for an endoscopy. Please follow written instructions given to you at your visit today. If you use inhalers (even only as needed), please bring them with you on the day of your procedure. Your physician has requested that you go to www.startemmi.com and enter the access code given to you at your visit today. This web site gives a general overview about your procedure. However, you should still follow specific instructions given to you by our office regarding your preparation for the procedure.   Thank you for entrusting me with your care and for choosing [facility], Dr. Ileene PatrickSteven Armbruster

## 2018-11-25 NOTE — Progress Notes (Signed)
HPI :  67 year old female with a history of diverticulitis, GERD, hypertension, referred by Oralia Manis, DO for dysphagia and reflux. She is a new patient to me.   She has not seen by our office since January 2015. At that time she had a history of diverticulitis. She underwent a colonoscopy on January 2015 which showed pancolonic diverticulosis and no polyps.  Her main complaint today is dysphagia. She reports intermittent solid food dysphagia for the past 2 months. Particularly beef and meat can cause food to get hung up. She denies any odynophagia. She has been on Protonix 40 mg once a day for a long time, they works to control her pyrosis and reflux symptoms. She denies any significant breakthrough reflux. She denies any nausea or vomiting. She denies any postprandial abdominal pains that bother her. At baseline she has some constipation intermittently, although more recently is having regular stools. She will uses MiraLAX as needed which works well. She denies any blood in the stools. She denies any family history of colon cancer. She was given a colo-guard test for screening purposes but has not submitted yet.   Colonoscopy 01/03/2014 - pancolonic diverticulosis, no polyps  Past Medical History:  Diagnosis Date  . Abdominal pain, left lower quadrant 07/18/2015   Left Lower Quadrant Abdominal Pain. Patient with hysterectomy in the past. No vaginal bleeding/pain. No complaints of dysuria or incontinence. UA not significant. No constipation or hematochezia. Colonoscopy in 2015 significant for diverticulosis. CT abdomen in 2016 without hernias. No significant weight loss  - Likely diverticulitis - Will start patient Flagyl 100 mg TID and Ciprofloxacin 100 mg   . Anemia   . Anxiety    passing of husband  . Diabetes mellitus without complication (HCC)    metformin  . Diverticulosis of colon   . GERD (gastroesophageal reflux disease)   . Hypercholesteremia   . Hypertension      Past  Surgical History:  Procedure Laterality Date  . PARTIAL HYSTERECTOMY     Family History  Problem Relation Age of Onset  . Heart disease Father   . Diabetes Sister   . Diabetes Brother   . Heart disease Brother   . Hyperlipidemia Brother   . Hypertension Brother   . Hypertension Sister    Social History   Tobacco Use  . Smoking status: Never Smoker  . Smokeless tobacco: Former Neurosurgeon    Types: Snuff  Substance Use Topics  . Alcohol use: No    Comment: occasional red wine  . Drug use: Never   Current Outpatient Medications  Medication Sig Dispense Refill  . acetaminophen (TYLENOL) 500 MG tablet Take 1,000 mg by mouth every 6 (six) hours as needed.    Marland Kitchen alendronate (FOSAMAX) 70 MG tablet Take 1 tablet (70 mg total) by mouth once a week. Take with a full glass of water on an empty stomach. 12 tablet 3  . amLODipine (NORVASC) 10 MG tablet Take 1 tablet (10 mg total) by mouth daily. 90 tablet 3  . aspirin EC 81 MG tablet Take 81 mg by mouth daily.    . Blood Pressure Monitoring (BLOOD PRESSURE CUFF) MISC 1 Device by Does not apply route daily. 1 each 0  . Calcium Carb-Cholecalciferol (CALCIUM 1000 + D) 1000-800 MG-UNIT TABS Take 1 tablet by mouth daily. 90 tablet 3  . glucose blood test strip Check blood sugar once daily fasting 50 each 11  . hydrochlorothiazide (HYDRODIURIL) 25 MG tablet TAKE 1 TABLET BY MOUTH EVERY DAY  90 tablet 0  . Multiple Vitamin (MULTIVITAMIN WITH MINERALS) TABS tablet Take 1 tablet by mouth daily.    . pantoprazole (PROTONIX) 40 MG tablet Take 1 tablet (40 mg total) by mouth daily. 90 tablet 3  . polyethylene glycol powder (GLYCOLAX/MIRALAX) powder Take 17 g by mouth 2 (two) times daily as needed. 3350 g 1  . pravastatin (PRAVACHOL) 20 MG tablet Take 1 tablet (20 mg total) by mouth daily. 90 tablet 3  . spironolactone (ALDACTONE) 50 MG tablet TAKE 1 TABLET BY MOUTH EVERY DAY 30 tablet 1  . traZODone (DESYREL) 50 MG tablet Take 1 tablet (50 mg total) by mouth  at bedtime as needed for sleep. 90 tablet 0   No current facility-administered medications for this visit.    Allergies  Allergen Reactions  . Lisinopril Swelling    Angioedema      Review of Systems: All systems reviewed and negative except where noted in HPI.   Lab Results  Component Value Date   WBC 7.0 09/02/2018   HGB 12.5 09/02/2018   HCT 38.5 09/02/2018   MCV 100.0 09/02/2018   PLT 242 09/02/2018    Lab Results  Component Value Date   CREATININE 1.14 (H) 10/14/2018   BUN 15 10/14/2018   NA 136 10/14/2018   K 4.9 10/14/2018   CL 94 (L) 10/14/2018   CO2 26 10/14/2018    Lab Results  Component Value Date   ALT 10 05/25/2017   AST 16 05/25/2017   ALKPHOS 78 05/25/2017   BILITOT 0.7 05/25/2017     Physical Exam: BP (!) 146/70   Pulse 78   Ht 5' 4.5" (1.638 m)   Wt 110 lb (49.9 kg)   BMI 18.59 kg/m  Constitutional: Pleasant,well-developed, female in no acute distress. HEENT: Normocephalic and atraumatic. Conjunctivae are normal. No scleral icterus. Neck supple.  Cardiovascular: Normal rate, regular rhythm.  Pulmonary/chest: Effort normal and breath sounds normal. No wheezing, rales or rhonchi. Abdominal: Soft, nondistended, nontender.  There are no masses palpable. No hepatomegaly. Extremities: no edema Lymphadenopathy: No cervical adenopathy noted. Neurological: Alert and oriented to person place and time. Skin: Skin is warm and dry. No rashes noted. Psychiatric: Normal mood and affect. Behavior is normal.   ASSESSMENT AND PLAN: 67 year old female here for new patient assessment of the following issues:  Dysphagia / GERD - intermittent solid food dysphagia to solids, particularly meat. Suspect she could have a peptic stricture due to reflux however discussed differential with her. I recommended an upper endoscopy with possible dilation to further evaluate and treat. I discussed the risks and benefits of endoscopy and anesthesia with her, she wanted  to proceed. We otherwise discussed long-term management of her reflux. She is on a very stable dose of Protonix long-standing. We discussed long-term risks and benefits of chronic PPI use. I think we should await the results of her endoscopy. If she has no active esophagitis or evidence of Barrett's, we will attempt to wean down her Protonix use and use the lowest dose needed to control her symptoms, or use of an alternative such as Pepcid. She agreed with the plan, further recommendations pending results.  Constipation / colon cancer screening - she will continue MiraLAX as needed, working well for her. Otherwise discussed that her colonoscopy is up-to-date from 2015, she does not warrant colon cancer screening until 2025. I do not think she warrants stool based testing at this time given her recent colonoscopy and the absence of any new symptoms. She  agreed.   Pamela PatrickSteven Jerrick Farve, MD [facility]  CC: Oralia ManisAbraham, Sherin, DO

## 2018-11-28 ENCOUNTER — Other Ambulatory Visit: Payer: Self-pay | Admitting: Family Medicine

## 2018-11-28 DIAGNOSIS — I10 Essential (primary) hypertension: Secondary | ICD-10-CM

## 2018-11-29 ENCOUNTER — Encounter: Payer: Medicare HMO | Admitting: Gastroenterology

## 2018-12-17 ENCOUNTER — Encounter: Payer: Medicare HMO | Admitting: Gastroenterology

## 2018-12-23 ENCOUNTER — Encounter: Payer: Self-pay | Admitting: Family Medicine

## 2018-12-23 ENCOUNTER — Ambulatory Visit (INDEPENDENT_AMBULATORY_CARE_PROVIDER_SITE_OTHER): Payer: Medicare HMO | Admitting: Family Medicine

## 2018-12-23 ENCOUNTER — Other Ambulatory Visit: Payer: Self-pay

## 2018-12-23 DIAGNOSIS — I10 Essential (primary) hypertension: Secondary | ICD-10-CM | POA: Diagnosis not present

## 2018-12-23 NOTE — Assessment & Plan Note (Signed)
Headaches likely secondary to hypertension.  Blood pressure is elevated today.  Patient previously normotensive on current regimen.  Unlikely renal causes patient's renal function has been stable recently.  Last creatinine in the member was at baseline.  Patient not using any NSAIDs or decongestions.  Improving diet as well as exercising more.  Can consider social stressors this patient has a brother with prostate cancer.  Can consider cardiac etiology, however less likely as patient has no chest pain.  We will continue current medication regimen at this time.  Advised patient to record blood pressure readings at home.  Advised to follow-up in 1 to 2 weeks so that we can ensure her blood pressure is well controlled.  Advised at that appointment to bring in all medication she uses as well as log of blood pressure readings.  Also advised her to bring her blood pressure cuff so we can check her cuff against the blood pressure monitor here. -Continue Norvasc 10 mg, HCTZ 25 mg, spironolactone 50 mg -Follow-up in 1 to 2 weeks -Strict return precautions given

## 2018-12-23 NOTE — Progress Notes (Signed)
   Subjective:    Patient ID: Pamela [name], female    DOB: [birth year], 69 y.o.   MRN: [id number]   CC: headaches  HPI: Headaches Patient reports concern for headaches.  States that she has had intermittent headaches throughout the week and was concerned that her blood pressure was high.  Patient does not check blood pressures at home but does have a blood pressure cuff.  Plans on checking blood pressures now.  Not using NSAIDs or decongestions.  Patient does have a brother with prostate cancer who she is helping to care for now. - Medications: Norvasc 10, HCTZ 25, prolactin 50 - Compliance: good - Checking BP at home: None - Denies any SOB, CP, vision changes, LE edema, medication SEs, or symptoms of hypotension - Diet: Staying away from salt - Exercise: Walks every other day.  Is planning to go back to [facility] to start exercise regimen - Non-smoker   Objective:  BP (!) 152/68   Pulse 79   Temp 98.4 F (36.9 C) (Oral)   Ht 5' 4.5" (1.638 m)   Wt 112 lb (50.8 kg)   SpO2 99%   BMI 18.93 kg/m  Vitals and nursing note reviewed  General: well nourished, in no acute distress HEENT: normocephalic, PERRL, EOMI, no scleral icterus or conjunctival pallor, no nasal discharge, moist mucous membranes, good dentition without erythema or discharge noted in posterior oropharynx Neck: supple, non-tender, without lymphadenopathy Cardiac: RRR, clear S1 and S2, no murmurs, rubs, or gallops Respiratory: clear to auscultation bilaterally, no increased work of breathing Abdomen: soft, nontender, nondistended, no masses or organomegaly. Bowel sounds present Extremities: no edema or cyanosis. Warm, well perfused. 2+ radial and PT pulses bilaterally Skin: warm and dry, no rashes noted Neuro: alert and oriented, no focal deficits   Assessment & Plan:    HYPERTENSION, BENIGN SYSTEMIC Headaches likely secondary to hypertension.  Blood pressure is elevated today.  Patient previously normotensive on  current regimen.  Unlikely renal causes patient's renal function has been stable recently.  Last creatinine in the member was at baseline.  Patient not using any NSAIDs or decongestions.  Improving diet as well as exercising more.  Can consider social stressors this patient has a brother with prostate cancer.  Can consider cardiac etiology, however less likely as patient has no chest pain.  We will continue current medication regimen at this time.  Advised patient to record blood pressure readings at home.  Advised to follow-up in 1 to 2 weeks so that we can ensure her blood pressure is well controlled.  Advised at that appointment to bring in all medication she uses as well as log of blood pressure readings.  Also advised her to bring her blood pressure cuff so we can check her cuff against the blood pressure monitor here. -Continue Norvasc 10 mg, HCTZ 25 mg, spironolactone 50 mg -Follow-up in 1 to 2 weeks -Strict return precautions given    Return in about 2 weeks (around 01/06/2019).   Oralia Manis, DO, PGY-2

## 2018-12-23 NOTE — Patient Instructions (Signed)
Blood Pressure Record Sheet To take your blood pressure, you will need a blood pressure machine. You can buy a blood pressure machine (blood pressure monitor) at your clinic, drug store, or online. When choosing one, consider:  An automatic monitor that has an arm cuff.  A cuff that wraps snugly around your upper arm. You should be able to fit only one finger between your arm and the cuff.  A device that stores blood pressure reading results.  Do not choose a monitor that measures your blood pressure from your wrist or finger. Follow your health care provider's instructions for how to take your blood pressure. To use this form:  Get one reading in the morning (a.m.) before you take any medicines.  Get one reading in the evening (p.m.) before supper.  Take at least 2 readings with each blood pressure check. This makes sure the results are correct. Wait 1-2 minutes between measurements.  Write down the results in the spaces on this form.  Repeat this once a week, or as told by your health care provider.  Make a follow-up appointment with your health care provider to discuss the results. Blood pressure log Date: _______________________  a.m. _____________________(1st reading) _____________________(2nd reading)  p.m. _____________________(1st reading) _____________________(2nd reading) Date: _______________________  a.m. _____________________(1st reading) _____________________(2nd reading)  p.m. _____________________(1st reading) _____________________(2nd reading) Date: _______________________  a.m. _____________________(1st reading) _____________________(2nd reading)  p.m. _____________________(1st reading) _____________________(2nd reading) Date: _______________________  a.m. _____________________(1st reading) _____________________(2nd reading)  p.m. _____________________(1st reading) _____________________(2nd reading) Date: _______________________  a.m.  _____________________(1st reading) _____________________(2nd reading)  p.m. _____________________(1st reading) _____________________(2nd reading) This information is not intended to replace advice given to you by your health care provider. Make sure you discuss any questions you have with your health care provider. Document Released: 08/23/2003 Document Revised: 11/24/2017 Document Reviewed: 11/24/2017 Elsevier Interactive Patient Education  [employer]2019 Elsevier Inc.   It was a pleasure seeing you today.   Today we discussed your blood pressure  For your HTN: check your blood pressure daily and keep a log. Bring this in as well as your medications for follow up in 1-2 weeks. AVOID any ibuprofen or NSAIDs (including BC powder). Avoid cold medicines with decongestants.   Please follow up in 1-2 weeks or sooner if symptoms persist or worsen. Please call the clinic immediately if you have any concerns.   Our clinic's number is [phone]. Please call with questions or concerns.   Please go to the emergency room if you have chest pain or shortness of breath.   Thank you,  Oralia ManisSherin Annslee Tercero, DO

## 2019-01-01 ENCOUNTER — Other Ambulatory Visit: Payer: Self-pay | Admitting: Family Medicine

## 2019-01-01 DIAGNOSIS — I10 Essential (primary) hypertension: Secondary | ICD-10-CM

## 2019-01-05 DIAGNOSIS — E119 Type 2 diabetes mellitus without complications: Secondary | ICD-10-CM | POA: Diagnosis not present

## 2019-01-05 DIAGNOSIS — H5213 Myopia, bilateral: Secondary | ICD-10-CM | POA: Diagnosis not present

## 2019-01-05 DIAGNOSIS — H524 Presbyopia: Secondary | ICD-10-CM | POA: Diagnosis not present

## 2019-01-05 DIAGNOSIS — H40023 Open angle with borderline findings, high risk, bilateral: Secondary | ICD-10-CM | POA: Diagnosis not present

## 2019-01-05 DIAGNOSIS — H35033 Hypertensive retinopathy, bilateral: Secondary | ICD-10-CM | POA: Diagnosis not present

## 2019-01-05 LAB — HM DIABETES EYE EXAM

## 2019-01-10 NOTE — Progress Notes (Deleted)
   Subjective:    Patient ID: Pamela [name], female    DOB: 05-05-1951, 68 y.o.   MRN: [id number]   CC: BP f/u  HPI: Hypertension: - Medications: norvasc 10, HCTZ 25, spironolactone 50  - Compliance: *** - Checking BP at home: *** - Denies any SOB, CP, vision changes, LE edema, medication SEs, or symptoms of hypotension - Diet: *** - Exercise: ***   Smoking status reviewed  Review of Systems   Objective:  There were no vitals taken for this visit. Vitals and nursing note reviewed  General: well nourished, in no acute distress HEENT: normocephalic, TM's visualized bilaterally, no scleral icterus or conjunctival pallor, no nasal discharge, moist mucous membranes, good dentition without erythema or discharge noted in posterior oropharynx Neck: supple, non-tender, without lymphadenopathy Cardiac: RRR, clear S1 and S2, no murmurs, rubs, or gallops Respiratory: clear to auscultation bilaterally, no increased work of breathing Abdomen: soft, nontender, nondistended, no masses or organomegaly. Bowel sounds present Extremities: no edema or cyanosis. Warm, well perfused. 2+ radial and PT pulses bilaterally Skin: warm and dry, no rashes noted Neuro: alert and oriented, no focal deficits   Assessment & Plan:    No problem-specific Assessment & Plan notes found for this encounter.    No follow-ups on file.   Oralia Manis, DO, PGY-2

## 2019-01-11 ENCOUNTER — Other Ambulatory Visit: Payer: Self-pay

## 2019-01-11 DIAGNOSIS — M899 Disorder of bone, unspecified: Secondary | ICD-10-CM

## 2019-01-11 DIAGNOSIS — M949 Disorder of cartilage, unspecified: Principal | ICD-10-CM

## 2019-01-12 MED ORDER — ALENDRONATE SODIUM 70 MG PO TABS: 70.0000 mg | ORAL_TABLET | ORAL | 3 refills | Status: DC

## 2019-01-13 ENCOUNTER — Ambulatory Visit: Payer: Medicare HMO | Admitting: Family Medicine

## 2019-01-30 ENCOUNTER — Other Ambulatory Visit: Payer: Self-pay | Admitting: Family Medicine

## 2019-01-30 DIAGNOSIS — I10 Essential (primary) hypertension: Secondary | ICD-10-CM

## 2019-02-18 ENCOUNTER — Encounter: Payer: Self-pay | Admitting: Family Medicine

## 2019-03-02 ENCOUNTER — Other Ambulatory Visit: Payer: Self-pay | Admitting: Family Medicine

## 2019-03-02 DIAGNOSIS — I10 Essential (primary) hypertension: Secondary | ICD-10-CM

## 2019-03-25 ENCOUNTER — Other Ambulatory Visit: Payer: Self-pay | Admitting: Family Medicine

## 2019-03-25 DIAGNOSIS — I10 Essential (primary) hypertension: Secondary | ICD-10-CM

## 2019-04-22 ENCOUNTER — Other Ambulatory Visit: Payer: Self-pay | Admitting: Family Medicine

## 2019-04-22 DIAGNOSIS — I10 Essential (primary) hypertension: Secondary | ICD-10-CM

## 2019-05-20 ENCOUNTER — Other Ambulatory Visit: Payer: Self-pay | Admitting: Family Medicine

## 2019-05-20 DIAGNOSIS — I10 Essential (primary) hypertension: Secondary | ICD-10-CM

## 2019-05-29 NOTE — Progress Notes (Signed)
   Subjective:    Patient ID: Pamela [name], female    DOB: 1951-05-08, 68 y.o.   MRN: [id number]   CC: HTN  HPI: Hypertension: - Medications: Norvasc 10 mg, hydrochlorothiazide 25 mg, spironolactone 50 mg - Compliance: Has been out of hydrochlorothiazide x2 months. - Checking BP at home: Needs to replace blood pressure cuff batteriesh - Denies any SOB, CP, vision changes, LE edema, medication SEs, or symptoms of hypotension - Diet: not a lot of heavy foods, every now and then eats or pepsi, no salty foods, no fried foods (mostly grilled, baked, or broiled)  - Exercise: not walking as much due to heat, but tries to exercise daily   Diabetes Fasting checks: Does not check Post prandial does not check Compliance: Eating healthy and exercising daily Diet: See above Exercise: See above Eye exam: Up-to-date Foot exam: Obtained today A1C: 6.2 Symptoms: No symptoms of hypoglycemia.  No symptoms of  polyuria, polydipsia.  No numbness in extremities, and no foot ulcers/trauma Meds: Diet controlled  Hyperlipidemia Meds: pravastatin 20mg , ASA 81mg   Diet: see above Exercise: see above  The ASCVD Risk score (Vivian., et al., 2013) failed to calculate for the following reasons:   Cannot find a previous HDL lab   Cannot find a previous total cholesterol lab   Objective:  BP (!) 155/70   Pulse 100   Wt 115 lb (52.2 kg)   SpO2 99%   BMI 19.43 kg/m  Vitals and nursing note reviewed  General: well nourished, in no acute distress HEENT: normocephalic, TM's visualized bilaterally, no scleral icterus or conjunctival pallor, no nasal discharge, moist mucous membranes, good dentition without erythema or discharge noted in posterior oropharynx Neck: supple, non-tender, without lymphadenopathy Cardiac: RRR, clear S1 and S2, no murmurs, rubs, or gallops Respiratory: clear to auscultation bilaterally, no increased work of breathing Abdomen: soft, nontender, nondistended, no masses or  organomegaly. Bowel sounds present Extremities: no edema or cyanosis. Warm, well perfused. 2+ radial and PT pulses bilaterally Skin: warm and dry, no rashes noted Neuro: alert and oriented, no focal deficits Foot: No deformities, no ulcerations, no other skin breakdown bilaterally, intact to touch and monofilament testing bilaterally, PT and DP intact bilaterally   Assessment & Plan:    HYPERTENSION, BENIGN SYSTEMIC Poorly controlled as patient has been out of medications.  Wrote down all medications for patient as well as went through the medication she had and wrote on bottles what they were for.  Explained to patient that she is supposed to be on 3 blood pressure medications.  Sent in 32-month supply with 3 refills.  Advised that she check blood pressures daily at home.  Advised that patient follow-up in 1 week for nursing blood pressure check.  Follow-up in 3 months for MD blood pressure check. Will check BMP today.  Hx of diabetes mellitus Well-controlled with diet and exercise alone.  Will check A1c's yearly as she seems to be well controlled.  Encouraged to continue healthy diet and exercise.  Foot exam performed today.  Will obtain urine microalbumin to creatinine ratio.  HLD (hyperlipidemia) Continue pravastatin and aspirin.  Will obtain lipid panel today.    Return in about 1 week (around 06/06/2019) for RN BLOOD PRESSURE CHECK.   Caroline More, DO, PGY-2

## 2019-05-30 ENCOUNTER — Other Ambulatory Visit: Payer: Self-pay

## 2019-05-30 ENCOUNTER — Ambulatory Visit (INDEPENDENT_AMBULATORY_CARE_PROVIDER_SITE_OTHER): Payer: Medicare HMO | Admitting: Family Medicine

## 2019-05-30 ENCOUNTER — Encounter: Payer: Self-pay | Admitting: Family Medicine

## 2019-05-30 VITALS — BP 155/70 | HR 100 | Wt 115.0 lb

## 2019-05-30 DIAGNOSIS — E1159 Type 2 diabetes mellitus with other circulatory complications: Secondary | ICD-10-CM | POA: Diagnosis not present

## 2019-05-30 DIAGNOSIS — I10 Essential (primary) hypertension: Secondary | ICD-10-CM

## 2019-05-30 DIAGNOSIS — Z8639 Personal history of other endocrine, nutritional and metabolic disease: Secondary | ICD-10-CM | POA: Diagnosis not present

## 2019-05-30 DIAGNOSIS — I152 Hypertension secondary to endocrine disorders: Secondary | ICD-10-CM

## 2019-05-30 DIAGNOSIS — E785 Hyperlipidemia, unspecified: Secondary | ICD-10-CM | POA: Diagnosis not present

## 2019-05-30 LAB — POCT GLYCOSYLATED HEMOGLOBIN (HGB A1C): HbA1c, POC (controlled diabetic range): 6.2 % (ref 0.0–7.0)

## 2019-05-30 MED ORDER — HYDROCHLOROTHIAZIDE 25 MG PO TABS: 25.0000 mg | ORAL_TABLET | Freq: Every day | ORAL | 3 refills | Status: DC

## 2019-05-30 MED ORDER — PRAVASTATIN SODIUM 20 MG PO TABS: 20.0000 mg | ORAL_TABLET | Freq: Every day | ORAL | 3 refills | Status: DC

## 2019-05-30 MED ORDER — AMLODIPINE BESYLATE 10 MG PO TABS: 10.0000 mg | ORAL_TABLET | Freq: Every day | ORAL | 3 refills | Status: DC

## 2019-05-30 MED ORDER — SPIRONOLACTONE 50 MG PO TABS: 50.0000 mg | ORAL_TABLET | Freq: Every day | ORAL | 3 refills | Status: DC

## 2019-05-30 NOTE — Patient Instructions (Signed)
It was a pleasure seeing you today.   Today we discussed your blood pressure, diabetes, and cholesterol   For your blood pressure: I have sent in a 46-month supply as well as 3 refills of all 3 of your medications.  The medications you are supposed to be on are amlodipine, hydrochlorothiazide, and spironolactone.  Please continue to check your blood pressures at home.  Please follow-up in 1 week for nursing blood pressure check.  For your sugars and cholesterol please continue to eat healthy.  Please follow up in 1 week for a nursing visit and 3 months to see me or sooner if symptoms persist or worsen. Please call the clinic immediately if you have any concerns.   Our clinic's number is [phone]. Please call with questions or concerns.    Thank you,  Caroline More, DO

## 2019-05-30 NOTE — Assessment & Plan Note (Signed)
Continue pravastatin and aspirin.  Will obtain lipid panel today.

## 2019-05-30 NOTE — Assessment & Plan Note (Addendum)
Poorly controlled as patient has been out of medications.  Wrote down all medications for patient as well as went through the medication she had and wrote on bottles what they were for.  Explained to patient that she is supposed to be on 3 blood pressure medications.  Sent in 65-month supply with 3 refills.  Advised that she check blood pressures daily at home.  Advised that patient follow-up in 1 week for nursing blood pressure check.  Follow-up in 3 months for MD blood pressure check. Will check BMP today.

## 2019-05-30 NOTE — Assessment & Plan Note (Addendum)
Well-controlled with diet and exercise alone.  Will check A1c's yearly as she seems to be well controlled.  Encouraged to continue healthy diet and exercise.  Foot exam performed today.  Will obtain urine microalbumin to creatinine ratio.

## 2019-05-31 ENCOUNTER — Other Ambulatory Visit: Payer: Self-pay | Admitting: Family Medicine

## 2019-05-31 DIAGNOSIS — N183 Chronic kidney disease, stage 3 unspecified: Secondary | ICD-10-CM

## 2019-05-31 DIAGNOSIS — I120 Hypertensive chronic kidney disease with stage 5 chronic kidney disease or end stage renal disease: Secondary | ICD-10-CM

## 2019-05-31 LAB — BASIC METABOLIC PANEL
BUN/Creatinine Ratio: 14 (ref 12–28)
BUN: 17 mg/dL (ref 8–27)
CO2: 24 mmol/L (ref 20–29)
Calcium: 9.7 mg/dL (ref 8.7–10.3)
Chloride: 100 mmol/L (ref 96–106)
Creatinine, Ser: 1.25 mg/dL — ABNORMAL HIGH (ref 0.57–1.00)
GFR calc Af Amer: 51 mL/min/{1.73_m2} — ABNORMAL LOW (ref >59)
GFR calc non Af Amer: 45 mL/min/{1.73_m2} — ABNORMAL LOW (ref >59)
Glucose: 116 mg/dL — ABNORMAL HIGH (ref 65–99)
Potassium: 4.8 mmol/L (ref 3.5–5.2)
Sodium: 136 mmol/L (ref 134–144)

## 2019-05-31 LAB — LIPID PANEL
Chol/HDL Ratio: 2.9 ratio (ref 0.0–4.4)
Cholesterol, Total: 175 mg/dL (ref 100–199)
HDL: 61 mg/dL (ref >39)
LDL Calculated: 51 mg/dL (ref 0–99)
Triglycerides: 317 mg/dL — ABNORMAL HIGH (ref 0–149)
VLDL Cholesterol Cal: 63 mg/dL — ABNORMAL HIGH (ref 5–40)

## 2019-05-31 LAB — MICROALBUMIN / CREATININE URINE RATIO
Creatinine, Urine: 82.8 mg/dL
Microalb/Creat Ratio: 7 mg/g creat (ref 0–29)
Microalbumin, Urine: 6.1 ug/mL

## 2019-06-01 ENCOUNTER — Telehealth: Payer: Self-pay

## 2019-06-01 NOTE — Telephone Encounter (Signed)
Left HIPPA compliant message for patient on lab results.  Pamela [name], [place]

## 2019-07-25 NOTE — Progress Notes (Signed)
   Subjective:    Patient ID: Pamela [name], female    DOB: 01-20-1951, 68 y.o.   MRN: [id number]   CC: shoulder pain, leg cramps  HPI: Left Shoulder pain Patient today reporting left-sided shoulder pain.  States is been present for the past 1 and half months.  States that it wakes her up in the middle the night.  States that it pain is daily but can vary in severity.  Has been using Tylenol as needed without much benefit.  States that is worse when she is trying to sleep.  Patient can still do ADLs and even does things like picking up grocery bags.  Patient used to sleep on the left side but due to pain now has to sleep on her right side.  Denies any trauma to the area.  Denies any heavy lifting, pulling, pushing.  Leg cramps Patient today reports leg cramping.  States that she used to have leg and feet cramping but this improved, recently it has increased.  Pain is only at nighttime when she is going to bed.  States that her legs feel restless and she has to keep moving them.  If that goes on and off in the middle the night.  Objective:  BP 138/63   Pulse 80   Temp 98.5 F (36.9 C) (Oral)   Wt 113 lb (51.3 kg)   SpO2 98%   BMI 19.10 kg/m  Vitals and nursing note reviewed  General: well nourished, in no acute distress HEENT: normocephalic, no scleral icterus or conjunctival pallor  Neck: supple, non-tender, without lymphadenopathy Cardiac: RRR, clear S1 and S2, no murmurs, rubs, or gallops Respiratory: clear to auscultation bilaterally, no increased work of breathing Abdomen: soft, nontender, nondistended, no masses or organomegaly. Bowel sounds present Extremities: no edema or cyanosis. Warm, well perfused.  Left upper extremity resisted and passive flexion, extension, abduction, adduction.  Restriction is secondary to pain.  Able to move shoulder more and active range of motion however patient still has significant pain. Skin: warm and dry, no rashes noted Neuro: alert and  oriented, no focal deficits   Assessment & Plan:    Shoulder pain Patient with acute shoulder pain.  Differentials include rotator cuff dysfunction, shoulder impingement, tendinopathy.  Discussed with Dr. Nori Riis who recommended steroid shot as both diagnostic and treatment.  I discussed this with patient who states that she would like to refuse this at this time.  States that if shoulder pain does not improve with exercises she will return for the steroid injection.  After visit summary given with shoulder exercises.  I have also placed referrals for sports medicine as patient may benefit from follow-up with them.  Strict return precautions given.  Follow-up in 2 weeks if no improvement.  Restless leg Patient's leg cramping at night consistent with restless leg syndrome.  Will give Rx for Requip.  Will start at low dose.  Strict return precautions given.  Follow-up in 1 month if no improvement.    Return in about 2 weeks (around 08/09/2019), or if symptoms worsen or fail to improve.   Caroline More, DO, PGY-3

## 2019-07-26 ENCOUNTER — Ambulatory Visit (INDEPENDENT_AMBULATORY_CARE_PROVIDER_SITE_OTHER): Payer: Medicare HMO | Admitting: Family Medicine

## 2019-07-26 ENCOUNTER — Other Ambulatory Visit: Payer: Self-pay

## 2019-07-26 ENCOUNTER — Encounter: Payer: Self-pay | Admitting: Family Medicine

## 2019-07-26 VITALS — BP 138/63 | HR 80 | Temp 98.5°F | Wt 113.0 lb

## 2019-07-26 DIAGNOSIS — G2581 Restless legs syndrome: Secondary | ICD-10-CM | POA: Diagnosis not present

## 2019-07-26 DIAGNOSIS — M25512 Pain in left shoulder: Secondary | ICD-10-CM

## 2019-07-26 MED ORDER — ROPINIROLE HCL 0.25 MG PO TABS: 0.2500 mg | ORAL_TABLET | Freq: Every day | ORAL | 1 refills | Status: DC

## 2019-07-26 NOTE — Patient Instructions (Signed)
    If your shoulder pain does not get any better please follow up with either me or sports medicine. If your cramping does not improved please follow up in 2 weeks. Sooner if worse.

## 2019-07-27 DIAGNOSIS — M25519 Pain in unspecified shoulder: Secondary | ICD-10-CM | POA: Insufficient documentation

## 2019-07-27 DIAGNOSIS — G2581 Restless legs syndrome: Secondary | ICD-10-CM | POA: Insufficient documentation

## 2019-07-27 NOTE — Assessment & Plan Note (Signed)
Patient's leg cramping at night consistent with restless leg syndrome.  Will give Rx for Requip.  Will start at low dose.  Strict return precautions given.  Follow-up in 1 month if no improvement.

## 2019-07-27 NOTE — Assessment & Plan Note (Addendum)
Patient with acute shoulder pain.  Differentials include rotator cuff dysfunction, shoulder impingement, tendinopathy.  Discussed with Dr. Nori Riis who recommended steroid shot as both diagnostic and treatment.  I discussed this with patient who states that she would like to refuse this at this time.  States that if shoulder pain does not improve with exercises she will return for the steroid injection.  After visit summary given with shoulder exercises.  I have also placed referrals for sports medicine as patient may benefit from follow-up with them.  Strict return precautions given.  Follow-up in 2 weeks if no improvement.

## 2019-08-02 ENCOUNTER — Other Ambulatory Visit: Payer: Self-pay | Admitting: Family Medicine

## 2019-08-02 DIAGNOSIS — Z1231 Encounter for screening mammogram for malignant neoplasm of breast: Secondary | ICD-10-CM

## 2019-08-09 IMAGING — MG DIGITAL SCREENING BILATERAL MAMMOGRAM WITH TOMO AND CAD
8 series · 9 of 24 positions shown · non-contrast
Comparison: Previous exam(s).

CLINICAL DATA: Screening.

EXAM:
DIGITAL SCREENING BILATERAL MAMMOGRAM WITH TOMO AND CAD

[L CC synth-2D]
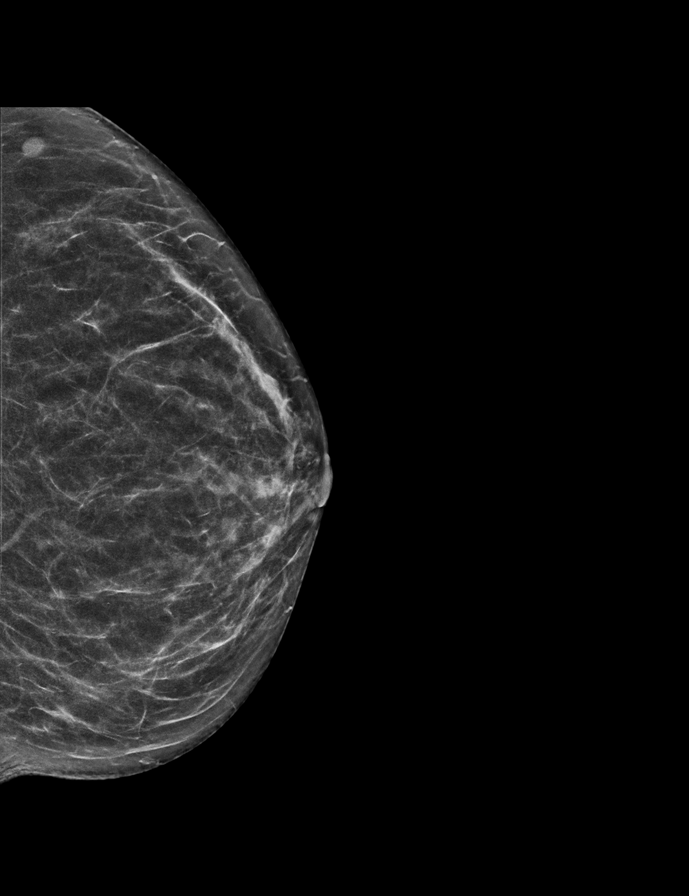

[R MLO synth-2D]
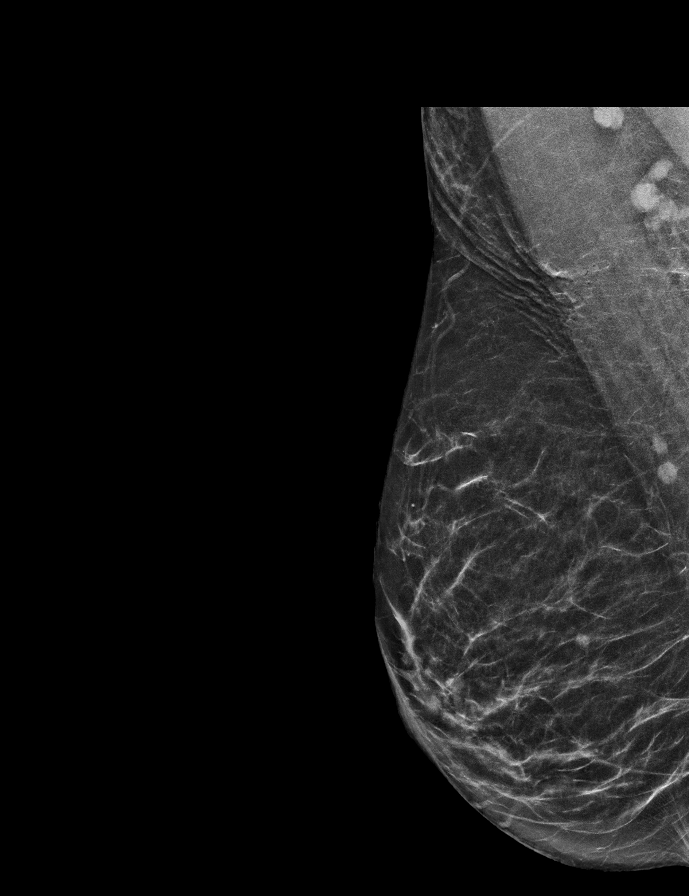

[R CC synth-2D]
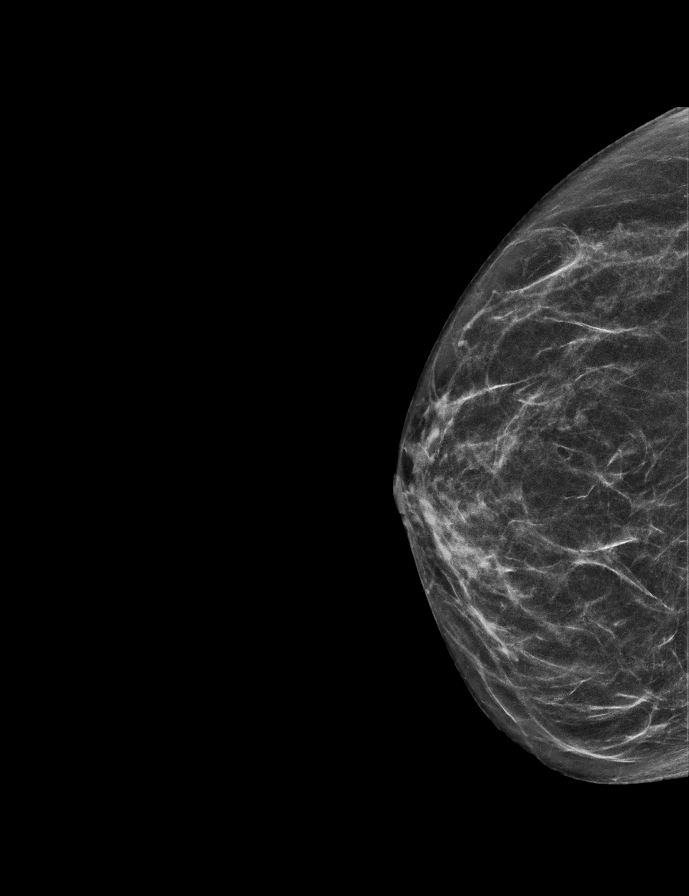

[L MLO synth-2D]
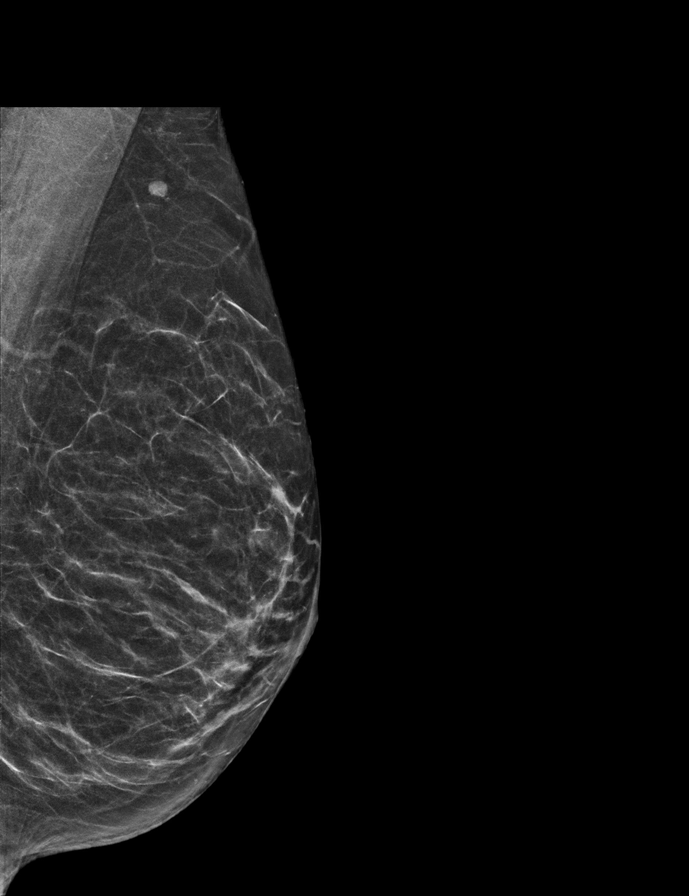

[R CC tomo · 2 of 49 frames shown]
[frame 16/49]
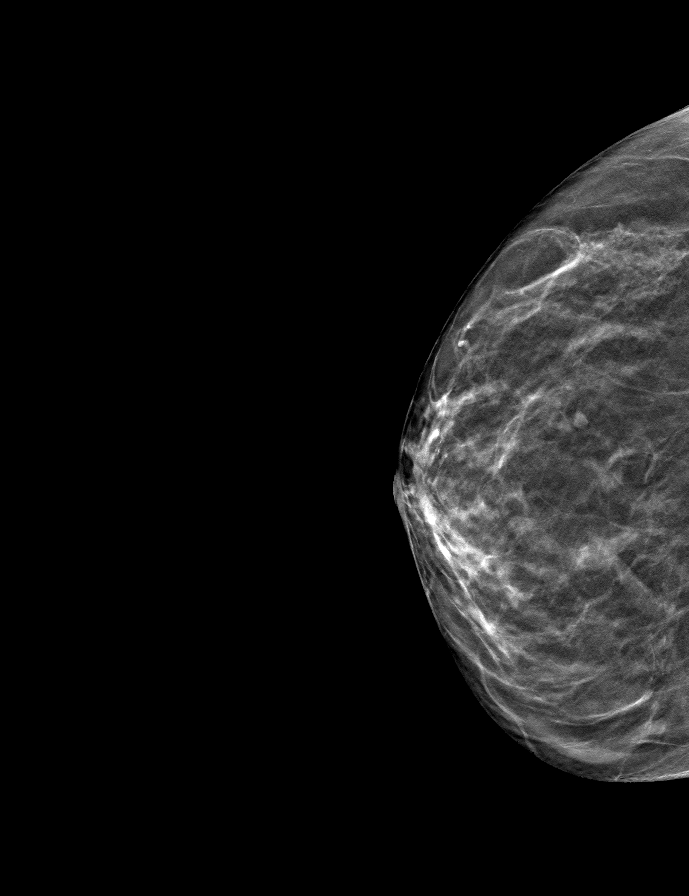
[frame 25/49]
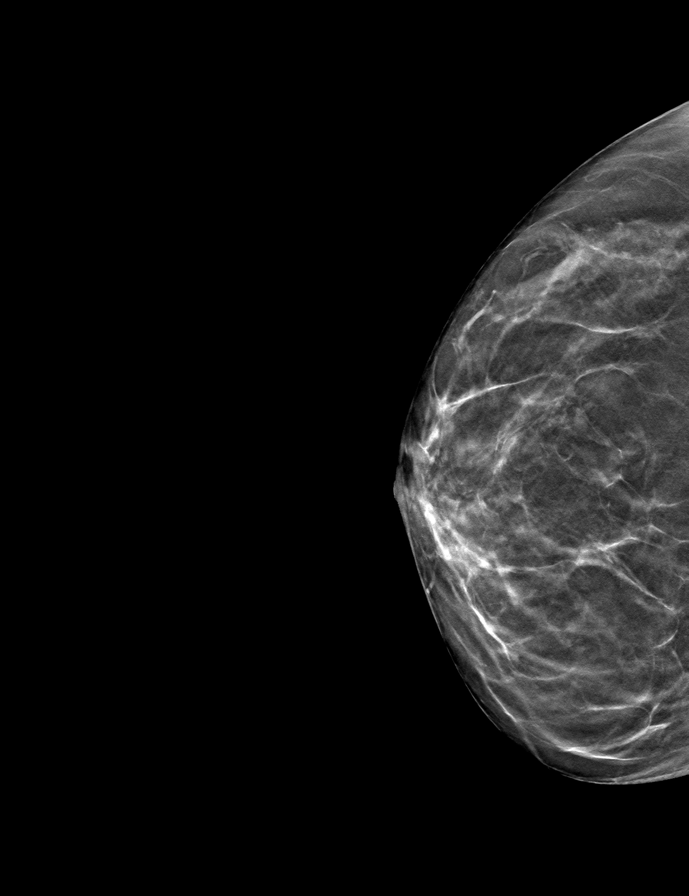

[R MLO tomo · tomo slice 27/53.0]
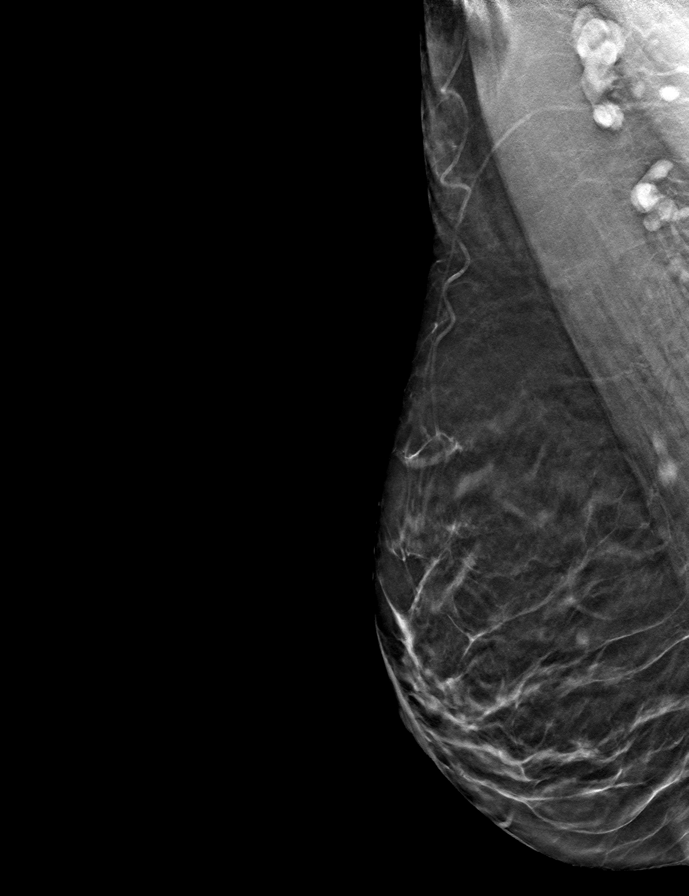

[L MLO tomo · tomo slice 26/51.0]
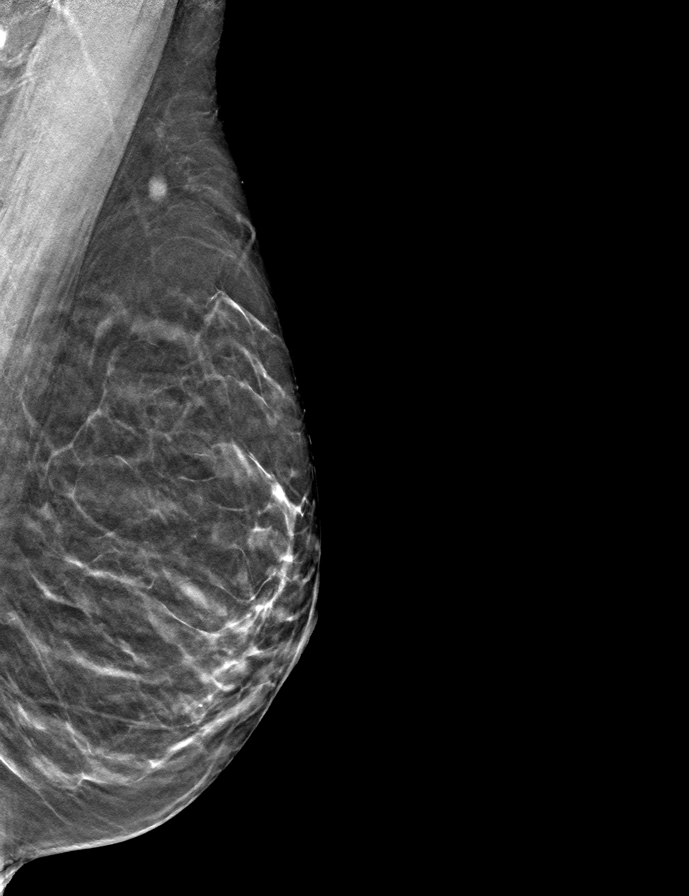

[L CC tomo · tomo slice 27/53.0]
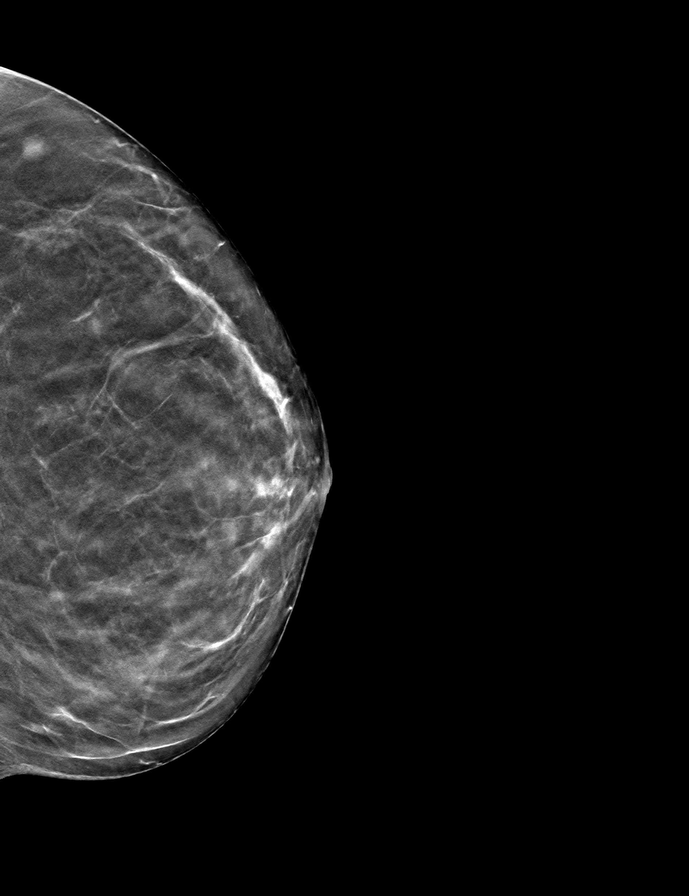

[9 of 24 positions shown; findings below may reference images not displayed]

ACR Breast Density Category b: There are scattered areas of
fibroglandular density.
FINDINGS: There are no findings suspicious for malignancy. Images were
processed with CAD.
IMPRESSION: No mammographic evidence of malignancy. A result letter of this
screening mammogram will be mailed directly to the patient.

RECOMMENDATION:
Screening mammogram in one year. (Code:[id number])

BI-RADS CATEGORY  1: Negative.

## 2019-08-10 ENCOUNTER — Ambulatory Visit: Payer: Medicare HMO | Admitting: Family Medicine

## 2019-08-10 ENCOUNTER — Other Ambulatory Visit: Payer: Self-pay

## 2019-08-10 ENCOUNTER — Ambulatory Visit: Payer: Self-pay

## 2019-08-10 ENCOUNTER — Encounter: Payer: Self-pay | Admitting: Family Medicine

## 2019-08-10 VITALS — BP 158/80 | Ht 66.0 in | Wt 113.0 lb

## 2019-08-10 DIAGNOSIS — M25512 Pain in left shoulder: Secondary | ICD-10-CM | POA: Diagnosis not present

## 2019-08-10 MED ORDER — METHYLPREDNISOLONE ACETATE 40 MG/ML IJ SUSP
40.0000 mg | Freq: Once | INTRAMUSCULAR | Status: AC
  Administered 2019-08-10: 11:00:00 40 mg via INTRA_ARTICULAR

## 2019-08-10 NOTE — Progress Notes (Signed)
Subjective:    Patient ID: Pamela [name], female    DOB: 05-Sep-1951, 68 y.o.   MRN: [id number]  CC: (L) shoulder pain  HPI  Patient is a 68 year-old female who presents with progressive (L) shoulder pain x 2 months. Patient states she first noticed a pain and ache in her left shoulder about two months ago. She denies any trauma, fall, or inciting event. Pain is located on her lateral side near her acromion. She first noticed the pain at nighttime, as she could no longer sleep on her left side. However, recently over the past few weeks her shoulder pain has progressed and gives her significant pain with motion and L-arm activity. Also with pain at nighttime when sleeping. Pain is a sharp pain when exacerbated, but she reports a chronic ache most of the day as well. She has been taking Tylenol as needed for pain control, which provides modest relief. She was seen at her family medicine physician about two weeks ago and given band exercises to perform at home. She does not believe these are helping. She was offered an injection at that time, but denied. No swelling, bruising.  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   Meds, Allergies, Medical History, Family History, Social History reviewed.  Review of Systems See HPI, otherwise negative    Objective:   Physical Exam  Gen - in NAD, non-toxic, alert Pulm - breathing unlabored Psych - appropriate mood and affect  MSK L shoulder:  No skin erythema, soft tissue swelling, instability, or gross deformity noted No warmth to palpation; no AC joint tenderness or abnormality; + TTP about 1 cm inferior to posterior border of acromion process Slightly diminished active flexion of L-shoulder to about 80 degrees, however able to obtain 90 degrees of flexion in passive motion; FROM of add/abduction 5/5 grip  strength of b/l hands, shoulder flexion/extension; + mildly diminished strength of external rotation of L-shoulder 2/2 to pain Neurovascular intact, cap refill < 2 secs b/l hands; sensation intact to light palpation b/l UE's Negative empty can, Neer's, drop arm, yergason's test Positive Horn blower's, Hawkin's, liftoff test  R shoulder: No deformity, instability. FROM with 5/5 strength. No tenderness to palpation. NVI distally.  Complete L-Shoulder Ultrasound: Biceps tendon: intact on long and trans views - small amount of tenosynovitis Pec major tendon: intact Subscapularis: no evidence tear on long or trans views. Infraspinatus: no evidence tear on long and trans views. Teres minor: no evidence tear on long and trans views. Supraspinatus: difficulty with modified crass positioning.  Visible portions of supraspinatus intact - no full thickness tear visualized. Subacormial bursa: moderate bursitis noted. Posterior shoulder: mild arthropathy  Impression:  Subacromial bursitis, mild biceps tenosynovitis.    Assessment & Plan:  1. Left Shoulder Pain - 2/2 rotator cuff tendinopathy with subacromial bursitis Progressive left shoulder pain, clinical exam and provocative testing demonstrates pain correlating with infraspinatus, also possibly teres minor pathology. + TTP over insertion site on greater tubercle of humerus and associated provocative testing. Ultrasound of shoulder without significant tear noted - bursitis seen.   - subacromial bursa joint injection under ultrasound given today in office - continue home PT exercises with theraband and scapular stabilization exercises daily, if not improved consider scheduled physical therapy - Tylenol, Topical voltaren gel as needed for pain - follow-up in clinic in 5-6 weeks, if  not improved consider further imaging or scheduled physical therapy at this time  After informed written consent timeout was performed, patient was seated in chair in  exam room. Left shoulder was prepped with alcohol swab and utilizing lateral approach with ultrasound guidance, patient's left subacromial space was injected with 3:1 bupivicaine: depomedrol. Patient tolerated the procedure well without immediate complications.  Elba Barman DO PGY2  Addendum:  Patient seen and examined with resident.  Agree with his note and findings.  Patient with rotator cuff tendinopathy with associated subacromial bursitis.  Ultrasound guided injection given today.  Icing, tylenol, topical medications reviewed.  Reviewed home exercise program as well with scapular strengthening.  F/u in 5-6 weeks.

## 2019-08-10 NOTE — Patient Instructions (Addendum)
You have rotator cuff tendinopathy with bursitis. Try to avoid painful activities (overhead activities, lifting with extended arm) as much as possible. Topical voltaren gel up to 4 times a day for pain and inflammation. Avoid aleve and ibuprofen. Can take tylenol in addition to this. Subacromial injection may be beneficial to help with pain and to decrease inflammation - you were given this today. Consider physical therapy with transition to home exercise program. Do home exercise program with theraband and scapular stabilization exercises daily 3 sets of 10 once a day. If not improving at follow-up we will consider further imaging, physical therapy, and/or nitro patches. Follow up with me in 5-6 weeks.

## 2019-08-16 ENCOUNTER — Telehealth: Payer: Self-pay | Admitting: Family Medicine

## 2019-08-16 NOTE — Telephone Encounter (Signed)
Her muscle spasms in her legs are really bothering her and she said she almost went to ED last night.  The medications she was given are not helping and she cannot rest during day or at night.  She would rather not come in for appointment unless it is her doctor but pcp doesn't have anything until 08/24/19.  Please call her to advise what she can do for this now.  Best # [phone], or [phone].

## 2019-08-16 NOTE — Telephone Encounter (Signed)
Please schedule patient for a telemedicine appointment. That way we can get [name] history but patient does not have to come in  Pamela [name], [place], PGY-3 [place] 08/16/2019 11:49 AM

## 2019-08-17 NOTE — Telephone Encounter (Signed)
Pt informed and scheduled for an appt. Deseree Blount, CMA  

## 2019-08-24 ENCOUNTER — Telehealth (INDEPENDENT_AMBULATORY_CARE_PROVIDER_SITE_OTHER): Payer: Medicare HMO | Admitting: Family Medicine

## 2019-08-24 ENCOUNTER — Other Ambulatory Visit: Payer: Self-pay

## 2019-08-24 DIAGNOSIS — R252 Cramp and spasm: Secondary | ICD-10-CM

## 2019-08-24 DIAGNOSIS — G2581 Restless legs syndrome: Secondary | ICD-10-CM | POA: Diagnosis not present

## 2019-08-24 MED ORDER — ROPINIROLE HCL 0.25 MG PO TABS: 0.5000 mg | ORAL_TABLET | Freq: Every day | ORAL | 0 refills | Status: DC

## 2019-08-24 NOTE — Assessment & Plan Note (Signed)
Symptoms consistent with restless leg syndrome.  Patient is on very low-dose of Requip.  We will plan to increase to 0.5 mg nightly.  Can always increase further if no improvement.  We will also obtain lab work including CK, CBC, anemia panel, BMP to ensure there are no electrolyte abnormalities or anemia causing issue.  She is also on a statin so will ensure there is no muscle damage which is why we are checking CK. Follow up if no improvement

## 2019-08-24 NOTE — Progress Notes (Signed)
[place] Telemedicine Visit I connected with  Pamela [name] on 08/24/19 by a video enabled telemedicine application and verified that I am speaking with the correct person using two identifiers.   I discussed the limitations of evaluation and management by telemedicine. The patient expressed understanding and agreed to proceed.  Patient consented to have virtual visit. Method of visit: Telephone  Encounter participants: Patient: Pamela [name] - located at Home Provider: Caroline More - located at [facility] Others (if applicable): None  Chief Complaint: Cramps  HPI: Cramps Patient recently seen on 8/18 for cramping at that time patient was given Requip.Patient reports that "medicine" for cramps is not helping. Cramping is still occurring at night. Has to walk around to get rid of cramping. During the day it doesn't bother her.  States that she continues to have leg cramping but it only occurs at nighttime and never throughout the day.    ROS: per HPI  Pertinent PMHx: HTN (on spironolactone), CKD stage 3, cramping of legs   Exam:  Respiratory: speaking full sentences, no increased WOB   Assessment/Plan:  Restless leg Symptoms consistent with restless leg syndrome.  Patient is on very low-dose of Requip.  We will plan to increase to 0.5 mg nightly.  Can always increase further if no improvement.  We will also obtain lab work including CK, CBC, anemia panel, BMP to ensure there are no electrolyte abnormalities or anemia causing issue.  She is also on a statin so will ensure there is no muscle damage which is why we are checking CK. Follow up if no improvement     Time spent during visit with patient: 15 minutes

## 2019-08-25 ENCOUNTER — Other Ambulatory Visit: Payer: Medicare HMO

## 2019-08-25 ENCOUNTER — Other Ambulatory Visit: Payer: Self-pay

## 2019-08-25 DIAGNOSIS — E039 Hypothyroidism, unspecified: Secondary | ICD-10-CM | POA: Diagnosis not present

## 2019-08-25 DIAGNOSIS — R252 Cramp and spasm: Secondary | ICD-10-CM | POA: Diagnosis not present

## 2019-08-27 LAB — BASIC METABOLIC PANEL
BUN/Creatinine Ratio: 15 (ref 12–28)
BUN: 16 mg/dL (ref 8–27)
CO2: 26 mmol/L (ref 20–29)
Calcium: 10.4 mg/dL — ABNORMAL HIGH (ref 8.7–10.3)
Chloride: 94 mmol/L — ABNORMAL LOW (ref 96–106)
Creatinine, Ser: 1.04 mg/dL — ABNORMAL HIGH (ref 0.57–1.00)
GFR calc Af Amer: 64 mL/min/{1.73_m2} (ref >59)
GFR calc non Af Amer: 55 mL/min/{1.73_m2} — ABNORMAL LOW (ref >59)
Glucose: 152 mg/dL — ABNORMAL HIGH (ref 65–99)
Potassium: 4.1 mmol/L (ref 3.5–5.2)
Sodium: 136 mmol/L (ref 134–144)

## 2019-08-27 LAB — CBC
Hemoglobin: 13.4 g/dL (ref 11.1–15.9)
MCH: 34.3 pg — ABNORMAL HIGH (ref 26.6–33.0)
MCHC: 34.4 g/dL (ref 31.5–35.7)
MCV: 100 fL — ABNORMAL HIGH (ref 79–97)
Platelets: 261 10*3/uL (ref 150–450)
RBC: 3.91 x10E6/uL (ref 3.77–5.28)
RDW: 12.6 % (ref 11.7–15.4)
WBC: 6.5 10*3/uL (ref 3.4–10.8)

## 2019-08-27 LAB — ANEMIA PANEL
Ferritin: 150 ng/mL (ref 15–150)
Folate, Hemolysate: 463 ng/mL
Folate, RBC: 1190 ng/mL (ref >498)
Hematocrit: 38.9 % (ref 34.0–46.6)
Iron Saturation: 18 % (ref 15–55)
Iron: 60 ug/dL (ref 27–139)
Retic Ct Pct: 1.6 % (ref 0.6–2.6)
Total Iron Binding Capacity: 341 ug/dL (ref 250–450)
UIBC: 281 ug/dL (ref 118–369)
Vitamin B-12: 1076 pg/mL (ref 232–1245)

## 2019-08-27 LAB — CK: Total CK: 61 U/L (ref 32–182)

## 2019-09-06 ENCOUNTER — Other Ambulatory Visit: Payer: Self-pay | Admitting: Family Medicine

## 2019-09-14 ENCOUNTER — Ambulatory Visit: Payer: Medicare HMO | Admitting: Family Medicine

## 2019-09-14 ENCOUNTER — Other Ambulatory Visit: Payer: Self-pay

## 2019-09-14 ENCOUNTER — Ambulatory Visit
Admission: RE | Admit: 2019-09-14 | Discharge: 2019-09-14 | Disposition: A | Discharge status: Home / Self Care | Payer: Medicare HMO | Source: Ambulatory Visit | Attending: Family Medicine | Admitting: Family Medicine

## 2019-09-14 DIAGNOSIS — Z1231 Encounter for screening mammogram for malignant neoplasm of breast: Secondary | ICD-10-CM

## 2019-09-15 ENCOUNTER — Other Ambulatory Visit: Payer: Self-pay

## 2019-09-15 ENCOUNTER — Ambulatory Visit (INDEPENDENT_AMBULATORY_CARE_PROVIDER_SITE_OTHER): Payer: Medicare HMO | Admitting: *Deleted

## 2019-09-15 DIAGNOSIS — Z23 Encounter for immunization: Secondary | ICD-10-CM

## 2019-09-15 NOTE — Progress Notes (Signed)
Pt tolerated flu shot well. Pearly Apachito, CMA  

## 2019-10-13 ENCOUNTER — Other Ambulatory Visit: Payer: Self-pay

## 2019-10-13 MED ORDER — ROPINIROLE HCL 0.25 MG PO TABS: 0.5000 mg | ORAL_TABLET | Freq: Every day | ORAL | 0 refills | Status: DC

## 2019-10-17 ENCOUNTER — Other Ambulatory Visit: Payer: Self-pay

## 2019-10-17 MED ORDER — ROPINIROLE HCL 0.25 MG PO TABS: 0.5000 mg | ORAL_TABLET | Freq: Every day | ORAL | 0 refills | Status: DC

## 2019-11-01 ENCOUNTER — Other Ambulatory Visit: Payer: Self-pay | Admitting: *Deleted

## 2019-11-01 NOTE — Telephone Encounter (Signed)
Rx request for trazadone 50mg  tablet, not on med list. Please advise. Aliene Tamura Kennon Holter, CMA

## 2019-11-02 MED ORDER — PANTOPRAZOLE SODIUM 40 MG PO TBEC: 40.0000 mg | DELAYED_RELEASE_TABLET | Freq: Every day | ORAL | 3 refills | Status: DC

## 2019-11-07 ENCOUNTER — Telehealth: Payer: Self-pay | Admitting: *Deleted

## 2019-11-07 NOTE — Telephone Encounter (Signed)
Rx request for trazodone 50mg  tablet. Not on med list. Please advise. Gagandeep Kossman Kennon Holter, CMA

## 2019-11-07 NOTE — Telephone Encounter (Signed)
Since it is not on her med list please have patient schedule a visit, can be video if she cannot come in  Caroline More, DO, PGY-3 [facility] 11/07/2019 11:35 AM

## 2019-11-07 NOTE — Telephone Encounter (Signed)
Spoke to patient and she states that she only takes Trazadone when she can not sleep which is not often.  There sometimes a lot going on and she can not sleep.  Made her a virtual appointment for 11/28/2019 @ 350.  Pamela [name], [place]

## 2019-11-14 ENCOUNTER — Other Ambulatory Visit: Payer: Self-pay | Admitting: Family Medicine

## 2019-11-28 ENCOUNTER — Telehealth (INDEPENDENT_AMBULATORY_CARE_PROVIDER_SITE_OTHER): Payer: Medicare HMO | Admitting: Family Medicine

## 2019-11-28 ENCOUNTER — Other Ambulatory Visit: Payer: Self-pay

## 2019-11-28 DIAGNOSIS — G4700 Insomnia, unspecified: Secondary | ICD-10-CM | POA: Diagnosis not present

## 2019-11-28 MED ORDER — TRAZODONE HCL 50 MG PO TABS: 25.0000 mg | ORAL_TABLET | Freq: Every evening | ORAL | 0 refills | Status: DC | PRN

## 2019-11-28 NOTE — Progress Notes (Signed)
[place] Telemedicine Visit  I connected with  Pamela [name] on 11/30/19 by a video enabled telemedicine application and verified that I am speaking with the correct person using two identifiers.   I discussed the limitations of evaluation and management by telemedicine. The patient expressed understanding and agreed to proceed.  Patient consented to have virtual visit. Method of visit: Video was attempted, but technology challenges prevented patient from using video, so visit was conducted via telephone.  Encounter participants: Patient: Pamela [name] - located at home Provider: Caroline More - located at [facility] Others (if applicable): none  Chief Complaint: insomnia   HPI: Insomnia Patient with recent med request for trazodone 50mg  tablets. This was not on her med list so was asked to follow up. Per chart review was started by Dr. Emmaline [name] in 2018 for trouble with sleep 2/2 stress.   Patient is requesting more trazodone. Patient reports insomnia is back, only sometimes. Otherwise is doing well. States low dose of trazodone always worked for her. Patient reports that she has always had these issues with sleep. Last time of trazodone was 1 week. Doesn't fill RX much because she does not use it much. Patient states that recently due to [place] she has thought more about pandemic so she worries at night. Tried OTC sleeping aid but they make her feel jittery. Is doing sleep hygiene at home, limiting caffeine. Sometimes watches tv before bed. Patient is addiment she does not want to be dependent on something every night, only as needed.  ROS: per HPI  Pertinent PMHx: insomnia   Exam:  Respiratory: speaking full sentenced, no increased WOB   Assessment/Plan:  Insomnia Patient with history of insomnia and midline as needed.  Patient states that due to recent coronavirus pandemic she has been up late worried more.  States that she only uses this as needed.   Given that patient is reliable and is only using this on occasion we will plan to refill her trazodone.  Advised to take 25 to 50 mg as needed nightly.  Patient to follow-up in 1 month to ensure improvement of symptoms or sooner if worsening.    Time spent during visit with patient: 15 minutes

## 2019-11-30 NOTE — Assessment & Plan Note (Signed)
Patient with history of insomnia and midline as needed.  Patient states that due to recent coronavirus pandemic she has been up late worried more.  States that she only uses this as needed.  Given that patient is reliable and is only using this on occasion we will plan to refill her trazodone.  Advised to take 25 to 50 mg as needed nightly.  Patient to follow-up in 1 month to ensure improvement of symptoms or sooner if worsening.

## 2019-12-14 ENCOUNTER — Other Ambulatory Visit: Payer: Self-pay | Admitting: *Deleted

## 2019-12-14 MED ORDER — ROPINIROLE HCL 0.25 MG PO TABS: 0.5000 mg | ORAL_TABLET | Freq: Every day | ORAL | 0 refills | Status: DC

## 2019-12-22 ENCOUNTER — Other Ambulatory Visit: Payer: Self-pay | Admitting: Family Medicine

## 2019-12-30 ENCOUNTER — Other Ambulatory Visit: Payer: Self-pay | Admitting: *Deleted

## 2019-12-30 MED ORDER — ROPINIROLE HCL 0.25 MG PO TABS: 0.5000 mg | ORAL_TABLET | Freq: Every day | ORAL | 3 refills | Status: DC

## 2020-02-20 NOTE — Progress Notes (Signed)
Subjective:  CC -- Annual Physical; With complaints of constipation   Constipation Constipation has tried to increase her vegetable intake but does not seem to improve much.  Having a bowel movement every 3 days.  Last bowel movement was on Sunday.  This was soft.  Normal bowel movements are" infarct.  States that she is to take MiraLAX.  And sometimes when she took it.  Is unsure if she was taking it correctly and that helped.  [facility]: Medication Compliance: yes  Cardiac Risk as of 02/21/20 (date):  The 10-year ASCVD risk score Denman George DC Jr., et al., 2013) is: 26.6%   Values used to calculate the score:     Age: 69 years     Sex: Female     Is Non-Hispanic African American: Yes     Diabetic: Yes     Tobacco smoker: No     Systolic Blood Pressure: 144 mmHg     Is BP treated: Yes     HDL Cholesterol: 61 mg/dL     Total Cholesterol: 175 mg/dL Aspirin: yes   Dx Hypertension: yes  Dx Hyperlipidemia: yes, pravastatin 20mg    Diabetes: yes Dx Obesity: no  Weight Loss: no   Physical Activity: yes  Urinary Incontinence: no    Social: Driving: no   Alcohol Use: yes  Tobacco Use: no   - Interested in Quitting: NA  Other Drugs: no  Independence: yes, helps care for her brother as well  Depression: no   - PHQ2 score: 0 Support and Life at Home: yes   Advanced Directives: requesting paperwork    Cancer:  Colorectal >> Colonoscopy: UTD  Lung >> Tobacco Use: no    - If so, previous Low-Dose CT screen: no  Breast >> Mammogram: UTD  Cervical/Endometrial >>  - Postmenopausal: yes - Hysterectomy: yes, partial  - Vaginal Bleeding: no - Pap Smear: over 65  Skin >> Suspicious lesions: no   Other: Osteoporosis: yes  Zoster Vaccine: will go to pharmacy to obtain Flu Vaccine: UTD  COVID Vaccine: Yes, will get 2nd dose 03/02/20 Pneumonia Vaccine: will obtain 2 weeks after COVID vaccine    Past Medical History Patient Active Problem List   Diagnosis Date Noted  .  Unprotected sexual intercourse 02/21/2020  . Bacterial vaginosis 02/21/2020  . Shoulder pain 07/27/2019  . Restless leg 07/27/2019  . Elevated serum creatinine 07/14/2018  . Cramp of both lower extremities 06/11/2018  . Dysphagia 03/10/2018  . Hx of diabetes mellitus 03/09/2018  . Sleep disorder 10/27/2017  . Osteoporosis 08/12/2017  . Diverticulosis 05/10/2015  . GERD (gastroesophageal reflux disease) 01/15/2015  . Rectal bleeding 11/09/2013  . Hemorrhoids 09/19/2013  . CKD (chronic kidney disease) stage 3, GFR 30-59 ml/min 09/18/2013  . Insomnia 03/07/2013  . Perimenopausal atrophic vaginitis 01/27/2012  . Counseling on health promotion and disease prevention 07/29/2011  . Constipation 12/27/2010  . HLD (hyperlipidemia) 02/04/2007  . HYPERTENSION, BENIGN SYSTEMIC 02/04/2007    Medications- reviewed and updated Current Outpatient Medications  Medication Sig Dispense Refill  . acetaminophen (TYLENOL) 500 MG tablet Take 1,000 mg by mouth every 6 (six) hours as needed.    02/06/2007 alendronate (FOSAMAX) 70 MG tablet Take 1 tablet (70 mg total) by mouth once a week. Take with a full glass of water on an empty stomach. 12 tablet 3  . amLODipine (NORVASC) 10 MG tablet Take 1 tablet (10 mg total) by mouth daily. 90 tablet 3  . aspirin EC 81 MG tablet Take 81  mg by mouth daily.    . Blood Pressure Monitoring (BLOOD PRESSURE CUFF) MISC 1 Device by Does not apply route daily. 1 each 0  . glucose blood test strip Check blood sugar once daily fasting 50 each 11  . hydrochlorothiazide (HYDRODIURIL) 25 MG tablet Take 1 tablet (25 mg total) by mouth daily. 90 tablet 3  . metroNIDAZOLE (FLAGYL) 500 MG tablet Take 1 tablet (500 mg total) by mouth 2 (two) times daily for 7 days. 14 tablet 0  . Multiple Vitamin (MULTIVITAMIN WITH MINERALS) TABS tablet Take 1 tablet by mouth daily.    . pantoprazole (PROTONIX) 40 MG tablet Take 1 tablet (40 mg total) by mouth daily. 90 tablet 3  . polyethylene glycol powder  (GLYCOLAX/MIRALAX) 17 GM/SCOOP powder Take 17 g by mouth 2 (two) times daily as needed. 3350 g 1  . pravastatin (PRAVACHOL) 20 MG tablet Take 1 tablet (20 mg total) by mouth daily. 90 tablet 3  . rOPINIRole (REQUIP) 0.25 MG tablet Take 2 tablets (0.5 mg total) by mouth at bedtime. 60 tablet 3  . spironolactone (ALDACTONE) 50 MG tablet Take 1 tablet (50 mg total) by mouth daily. 90 tablet 3  . traZODone (DESYREL) 50 MG tablet TAKE 0.5-1 TABLETS (25-50 MG TOTAL) BY MOUTH AT BEDTIME AS NEEDED FOR SLEEP. 90 tablet 0   No current facility-administered medications for this visit.   Diabetes Fasting checks: does not check  Post prandial does not check   Compliance: good  Diet: increased greens, vegetables, sometimes boost/ensure, not as big of an appetite anymore   Exercise: daily; yard work, stationary bike, daily walks  Eye exam: due. Foot exam: UTD  A1C: 6.1 Symptoms: no symptoms of hypoglycemia. no symptoms of  polyuria, polydipsia. no numbness in extremities, and no foot ulcers/trauma Meds: diet controlled  Pneumonia vaccine: due  Urine micro albumin:creatine ratio: UTD   Monitoring Labs and Parameters Last A1C:  Lab Results  Component Value Date   HGBA1C 6.1 02/21/2020   Last Lipid:     Component Value Date/Time   CHOL 175 05/30/2019 1459   HDL 61 05/30/2019 1459   LDLDIRECT 63 05/24/2010 1831   Last Bmet  Potassium  Date Value Ref Range Status  08/25/2019 4.1 3.5 - 5.2 mmol/L Final   Sodium  Date Value Ref Range Status  08/25/2019 136 134 - 144 mmol/L Final   Creat  Date Value Ref Range Status  01/21/2017 1.10 (H) 0.50 - 0.99 mg/dL Final    Comment:      For patients > or = 69 years of age: The upper reference limit for Creatinine is approximately 13% higher for people identified as African-American.      Creatinine, Ser  Date Value Ref Range Status  08/25/2019 1.04 (H) 0.57 - 1.00 mg/dL Final       Objective: BP (!) 144/70   Pulse (!) 104   Temp 97.8  F (36.6 C)   Wt 115 lb 3.2 oz (52.3 kg)   SpO2 99%   BMI 18.59 kg/m  Gen: NAD, alert, cooperative with exam HEENT: NCAT, EOMI, PERRL CV: RRR, good S1/S2, no murmur Resp: CTABL, no wheezes, non-labored Abd: Soft, Non Tender, Non Distended, BS present, no guarding or organomegaly Genital Exam: normal external genitalia, vulva, vagina, cervix, uterus and adnexa Ext: No edema, warm Neuro: Alert and oriented, No gross deficits Rectal exam: negative without mass, lesions or tenderness.  Assessment/Plan:  Hx of diabetes mellitus Well-controlled with diet and exercise.  Congratulated patient on  daily exercise and healthy diet.  Ambulatory referral to ophthalmology placed.  Patient will obtain pneumonia vaccine 2 weeks after her Covid vaccine.  We will plan to schedule an RN visit to obtain this.  Will obtain yearly lab work including BMP, CBC, lipid panel  HLD (hyperlipidemia) Lipid panel obtained today.  Continue pravastatin aspirin.  If continues to have elevated LDL can consider increasing to high intensity statin such as atorvastatin.  Unprotected sexual intercourse Will obtain STD testing today including gonorrhea and Chlamydia testing as well as wet prep. We will also obtain blood STD test including HIV, RPR, hepatitis C.  Bacterial vaginosis Patient with wet prep consistent with bacterial vaginosis.  Clue cells present.  Will give Rx for metronidazole.  Constipation Rectal exam within normal limits.  MiraLAX Rx provided.  Follow-up if no improvement.  Counseling on health promotion and disease prevention Patient will obtain pneumonia vaccine and Shingrix 2 weeks after her Covid vaccine. Paperwork regarding advanced directives provided to patient.  Patient will call if you would like assistance filling this out.   Orders Placed This Encounter  Procedures  . CBC  . Basic Metabolic Panel  . Lipid Panel  . HIV antibody (with reflex)  . RPR  . Hepatitis C antibody  .  Ambulatory referral to Ophthalmology    Referral Priority:   Routine    Referral Type:   Consultation    Referral Reason:   Specialty Services Required    Requested Specialty:   Ophthalmology    Number of Visits Requested:   1  . HgB A1c  . POCT Wet Prep [facility])    Meds ordered this encounter  Medications  . polyethylene glycol powder (GLYCOLAX/MIRALAX) 17 GM/SCOOP powder    Sig: Take 17 g by mouth 2 (two) times daily as needed.    Dispense:  3350 g    Refill:  1  . metroNIDAZOLE (FLAGYL) 500 MG tablet    Sig: Take 1 tablet (500 mg total) by mouth 2 (two) times daily for 7 days.    Dispense:  14 tablet    Refill:  0     Caroline More, DO, PGY-3 02/21/2020 7:15 PM

## 2020-02-21 ENCOUNTER — Other Ambulatory Visit (HOSPITAL_COMMUNITY)
Admission: RE | Admit: 2020-02-21 | Discharge: 2020-02-21 | Disposition: A | Discharge status: Home / Self Care | Payer: Medicare HMO | Source: Ambulatory Visit | Attending: Family Medicine | Admitting: Family Medicine

## 2020-02-21 ENCOUNTER — Encounter: Payer: Self-pay | Admitting: Family Medicine

## 2020-02-21 ENCOUNTER — Other Ambulatory Visit: Payer: Self-pay

## 2020-02-21 ENCOUNTER — Ambulatory Visit (INDEPENDENT_AMBULATORY_CARE_PROVIDER_SITE_OTHER): Payer: Medicare HMO | Admitting: Family Medicine

## 2020-02-21 VITALS — BP 144/70 | HR 104 | Temp 97.8°F | Wt 115.2 lb

## 2020-02-21 DIAGNOSIS — Z7251 High risk heterosexual behavior: Secondary | ICD-10-CM

## 2020-02-21 DIAGNOSIS — Z7189 Other specified counseling: Secondary | ICD-10-CM | POA: Diagnosis not present

## 2020-02-21 DIAGNOSIS — E785 Hyperlipidemia, unspecified: Secondary | ICD-10-CM | POA: Diagnosis not present

## 2020-02-21 DIAGNOSIS — K5900 Constipation, unspecified: Secondary | ICD-10-CM

## 2020-02-21 DIAGNOSIS — B9689 Other specified bacterial agents as the cause of diseases classified elsewhere: Secondary | ICD-10-CM

## 2020-02-21 DIAGNOSIS — Z8639 Personal history of other endocrine, nutritional and metabolic disease: Secondary | ICD-10-CM | POA: Diagnosis not present

## 2020-02-21 DIAGNOSIS — E119 Type 2 diabetes mellitus without complications: Secondary | ICD-10-CM | POA: Diagnosis not present

## 2020-02-21 DIAGNOSIS — R69 Illness, unspecified: Secondary | ICD-10-CM | POA: Diagnosis not present

## 2020-02-21 DIAGNOSIS — N760 Acute vaginitis: Secondary | ICD-10-CM

## 2020-02-21 LAB — POCT WET PREP (WET MOUNT)
Clue Cells Wet Prep Whiff POC: NEGATIVE
Trichomonas Wet Prep HPF POC: ABSENT

## 2020-02-21 LAB — POCT GLYCOSYLATED HEMOGLOBIN (HGB A1C): HbA1c, POC (controlled diabetic range): 6.1 % (ref 0.0–7.0)

## 2020-02-21 MED ORDER — METRONIDAZOLE 500 MG PO TABS: 500.0000 mg | ORAL_TABLET | Freq: Two times a day (BID) | ORAL | 0 refills | Status: AC

## 2020-02-21 MED ORDER — POLYETHYLENE GLYCOL 3350 17 GM/SCOOP PO POWD: 17.0000 g | Freq: Two times a day (BID) | ORAL | 1 refills | Status: DC | PRN

## 2020-02-21 NOTE — Patient Instructions (Addendum)
Healthy Eating Following a healthy eating pattern may help you to achieve and maintain a healthy body weight, reduce the risk of chronic disease, and live a long and productive life. It is important to follow a healthy eating pattern at an appropriate calorie level for your body. Your nutritional needs should be met primarily through food by choosing a variety of nutrient-rich foods. What are tips for following this plan? Reading food labels  Read labels and choose the following: ? Reduced or low sodium. ? Juices with 100% fruit juice. ? Foods with low saturated fats and high polyunsaturated and monounsaturated fats. ? Foods with whole grains, such as whole wheat, cracked wheat, [place] rice, and wild rice. ? Whole grains that are fortified with folic acid. This is recommended for women who are pregnant or who want to become pregnant.  Read labels and avoid the following: ? Foods with a lot of added sugars. These include foods that contain [place] sugar, corn sweetener, corn syrup, dextrose, fructose, glucose, high-fructose corn syrup, honey, invert sugar, lactose, malt syrup, maltose, molasses, raw sugar, sucrose, trehalose, or turbinado sugar.  Do not eat more than the following amounts of added sugar per day:  6 teaspoons (25 g) for women.  9 teaspoons (38 g) for men. ? Foods that contain processed or refined starches and grains. ? Refined grain products, such as white flour, degermed cornmeal, white bread, and white rice. Shopping  Choose nutrient-rich snacks, such as vegetables, whole fruits, and nuts. Avoid high-calorie and high-sugar snacks, such as potato chips, fruit snacks, and candy.  Use oil-based dressings and spreads on foods instead of solid fats such as butter, stick margarine, or cream cheese.  Limit pre-made sauces, mixes, and "instant" products such as flavored rice, instant noodles, and ready-made pasta.  Try more plant-protein sources, such as tofu, tempeh, black beans,  edamame, lentils, nuts, and seeds.  Explore eating plans such as the Mediterranean diet or vegetarian diet. Cooking  Use oil to saut or stir-fry foods instead of solid fats such as butter, stick margarine, or lard.  Try baking, boiling, grilling, or broiling instead of frying.  Remove the fatty part of meats before cooking.  Steam vegetables in water or broth. Meal planning   At meals, imagine dividing your plate into fourths: ? One-half of your plate is fruits and vegetables. ? One-fourth of your plate is whole grains. ? One-fourth of your plate is protein, especially lean meats, poultry, eggs, tofu, beans, or nuts.  Include low-fat dairy as part of your daily diet. Lifestyle  Choose healthy options in all settings, including home, work, school, restaurants, or stores.  Prepare your food safely: ? Wash your hands after handling raw meats. ? Keep food preparation surfaces clean by regularly washing with hot, soapy water. ? Keep raw meats separate from ready-to-eat foods, such as fruits and vegetables. ? Cook seafood, meat, poultry, and eggs to the recommended internal temperature. ? Store foods at safe temperatures. In general:  Keep cold foods at 59F (4.4C) or below.  Keep hot foods at 159F (60C) or above.  Keep your freezer at [facility] (-17.8C) or below.  Foods are no longer safe to eat when they have been between the temperatures of 40-159F (4.4-60C) for more than 2 hours. What foods should I eat? Fruits Aim to eat 2 cup-equivalents of fresh, canned (in natural juice), or frozen fruits each day. Examples of 1 cup-equivalent of fruit include 1 small apple, 8 large strawberries, 1 cup canned fruit,  cup  dried fruit, or 1 cup 100% juice. Vegetables Aim to eat 2-3 cup-equivalents of fresh and frozen vegetables each day, including different varieties and colors. Examples of 1 cup-equivalent of vegetables include 2 medium carrots, 2 cups raw, leafy greens, 1 cup chopped  vegetable (raw or cooked), or 1 medium baked potato. Grains Aim to eat 6 ounce-equivalents of whole grains each day. Examples of 1 ounce-equivalent of grains include 1 slice of bread, 1 cup ready-to-eat cereal, 3 cups popcorn, or  cup cooked rice, pasta, or cereal. Meats and other proteins Aim to eat 5-6 ounce-equivalents of protein each day. Examples of 1 ounce-equivalent of protein include 1 egg, 1/2 cup nuts or seeds, or 1 tablespoon (16 g) peanut butter. A cut of meat or fish that is the size of a deck of cards is about 3-4 ounce-equivalents.  Of the protein you eat each week, try to have at least 8 ounces come from seafood. This includes salmon, trout, herring, and anchovies. Dairy Aim to eat 3 cup-equivalents of fat-free or low-fat dairy each day. Examples of 1 cup-equivalent of dairy include 1 cup (240 mL) milk, 8 ounces (250 g) yogurt, 1 ounces (44 g) natural cheese, or 1 cup (240 mL) fortified soy milk. Fats and oils  Aim for about 5 teaspoons (21 g) per day. Choose monounsaturated fats, such as canola and olive oils, avocados, peanut butter, and most nuts, or polyunsaturated fats, such as sunflower, corn, and soybean oils, walnuts, pine nuts, sesame seeds, sunflower seeds, and flaxseed. Beverages  Aim for six 8-oz glasses of water per day. Limit coffee to three to five 8-oz cups per day.  Limit caffeinated beverages that have added calories, such as soda and energy drinks.  Limit alcohol intake to no more than 1 drink a day for nonpregnant women and 2 drinks a day for men. One drink equals 12 oz of beer (355 mL), 5 oz of wine (148 mL), or 1 oz of hard liquor (44 mL). Seasoning and other foods  Avoid adding excess amounts of salt to your foods. Try flavoring foods with herbs and spices instead of salt.  Avoid adding sugar to foods.  Try using oil-based dressings, sauces, and spreads instead of solid fats. This information is based on general [place] nutrition guidelines. For more  information, visit Build[employer].es. Exact amounts may vary based on your nutrition needs. Summary  A healthy eating plan may help you to maintain a healthy weight, reduce the risk of chronic diseases, and stay active throughout your life.  Plan your meals. Make sure you eat the right portions of a variety of nutrient-rich foods.  Try baking, boiling, grilling, or broiling instead of frying.  Choose healthy options in all settings, including home, work, school, restaurants, or stores. This information is not intended to replace advice given to you by your health care provider. Make sure you discuss any questions you have with your health care provider. Document Revised: 03/08/2018 Document Reviewed: 03/08/2018 Elsevier Patient Education  [place].  1. Please schedule an RN visit 2 weeks after your COVID vaccine to get a pneumonia vaccine 2. Please go to a local pharmacy 2 weeks after your COVID vaccine for the shingles vaccine. It is a 2 part vaccine 3. We will obtain blood work today, I will either call or send a mychart message with the results 4. Please use miralax daily for your constipation.  5. We obtained STD testing today, I will either call or send a letter with results 6.  Please review the advanced directives paperwork, If you need help completing let us know and we will set up a social work appointment to help fill this out.   Dr. Tammi Klippel

## 2020-02-21 NOTE — Assessment & Plan Note (Signed)
Rectal exam within normal limits.  MiraLAX Rx provided.  Follow-up if no improvement.

## 2020-02-21 NOTE — Assessment & Plan Note (Signed)
Lipid panel obtained today.  Continue pravastatin aspirin.  If continues to have elevated LDL can consider increasing to high intensity statin such as atorvastatin.

## 2020-02-21 NOTE — Assessment & Plan Note (Signed)
Well-controlled with diet and exercise.  Congratulated patient on daily exercise and healthy diet.  Ambulatory referral to ophthalmology placed.  Patient will obtain pneumonia vaccine 2 weeks after her Covid vaccine.  We will plan to schedule an RN visit to obtain this.  Will obtain yearly lab work including BMP, CBC, lipid panel

## 2020-02-21 NOTE — Assessment & Plan Note (Signed)
Will obtain STD testing today including gonorrhea and Chlamydia testing as well as wet prep. We will also obtain blood STD test including HIV, RPR, hepatitis C.

## 2020-02-21 NOTE — Assessment & Plan Note (Signed)
Patient will obtain pneumonia vaccine and Shingrix 2 weeks after her Covid vaccine. Paperwork regarding advanced directives provided to patient.  Patient will call if you would like assistance filling this out.

## 2020-02-21 NOTE — Assessment & Plan Note (Signed)
Patient with wet prep consistent with bacterial vaginosis.  Clue cells present.  Will give Rx for metronidazole.

## 2020-02-22 ENCOUNTER — Other Ambulatory Visit: Payer: Self-pay | Admitting: Family Medicine

## 2020-02-22 LAB — LIPID PANEL
Chol/HDL Ratio: 3 ratio (ref 0.0–4.4)
Cholesterol, Total: 213 mg/dL — ABNORMAL HIGH (ref 100–199)
HDL: 71 mg/dL (ref >39)
LDL Chol Calc (NIH): 103 mg/dL — ABNORMAL HIGH (ref 0–99)
Triglycerides: 233 mg/dL — ABNORMAL HIGH (ref 0–149)
VLDL Cholesterol Cal: 39 mg/dL (ref 5–40)

## 2020-02-22 LAB — RPR: RPR Ser Ql: NONREACTIVE

## 2020-02-22 LAB — CBC
Hematocrit: 40.4 % (ref 34.0–46.6)
Hemoglobin: 14 g/dL (ref 11.1–15.9)
MCH: 34.1 pg — ABNORMAL HIGH (ref 26.6–33.0)
MCHC: 34.7 g/dL (ref 31.5–35.7)
MCV: 99 fL — ABNORMAL HIGH (ref 79–97)
Platelets: 295 10*3/uL (ref 150–450)
RBC: 4.1 x10E6/uL (ref 3.77–5.28)
RDW: 12.3 % (ref 11.7–15.4)
WBC: 7.1 10*3/uL (ref 3.4–10.8)

## 2020-02-22 LAB — BASIC METABOLIC PANEL
BUN/Creatinine Ratio: 13 (ref 12–28)
BUN: 17 mg/dL (ref 8–27)
CO2: 23 mmol/L (ref 20–29)
Calcium: 11.1 mg/dL — ABNORMAL HIGH (ref 8.7–10.3)
Chloride: 94 mmol/L — ABNORMAL LOW (ref 96–106)
Creatinine, Ser: 1.3 mg/dL — ABNORMAL HIGH (ref 0.57–1.00)
GFR calc Af Amer: 49 mL/min/{1.73_m2} — ABNORMAL LOW (ref >59)
GFR calc non Af Amer: 42 mL/min/{1.73_m2} — ABNORMAL LOW (ref >59)
Glucose: 186 mg/dL — ABNORMAL HIGH (ref 65–99)
Potassium: 4 mmol/L (ref 3.5–5.2)
Sodium: 135 mmol/L (ref 134–144)

## 2020-02-22 LAB — CERVICOVAGINAL ANCILLARY ONLY
Chlamydia: NEGATIVE
Comment: NEGATIVE
Comment: NORMAL
Neisseria Gonorrhea: NEGATIVE

## 2020-02-22 LAB — HEPATITIS C ANTIBODY: Hep C Virus Ab: <0.1 s/co ratio (ref 0.0–0.9)

## 2020-02-22 LAB — HIV ANTIBODY (ROUTINE TESTING W REFLEX): HIV Screen 4th Generation wRfx: NONREACTIVE

## 2020-02-22 MED ORDER — ATORVASTATIN CALCIUM 40 MG PO TABS: 40.0000 mg | ORAL_TABLET | Freq: Every day | ORAL | 3 refills | Status: DC

## 2020-02-23 ENCOUNTER — Telehealth: Payer: Self-pay | Admitting: *Deleted

## 2020-02-23 NOTE — Telephone Encounter (Signed)
Attempted to call pt twice no answer or voicemail set up. Pamela [name] [name], CMA

## 2020-02-23 NOTE — Telephone Encounter (Signed)
-----   Message from Sherin Abraham, DO sent at 02/22/2020  3:16 PM EDT ----- Please inform patient that results are negative. 

## 2020-02-24 NOTE — Telephone Encounter (Signed)
Informed patient of negative results.  Patient verbalized understanding and was appreciative.  Glennie Hawk, CMA

## 2020-03-01 ENCOUNTER — Other Ambulatory Visit: Payer: Self-pay

## 2020-03-05 ENCOUNTER — Ambulatory Visit (INDEPENDENT_AMBULATORY_CARE_PROVIDER_SITE_OTHER): Payer: Medicare HMO | Admitting: Family Medicine

## 2020-03-05 ENCOUNTER — Other Ambulatory Visit: Payer: Self-pay

## 2020-03-05 ENCOUNTER — Encounter: Payer: Self-pay | Admitting: Family Medicine

## 2020-03-05 DIAGNOSIS — Z7189 Other specified counseling: Secondary | ICD-10-CM | POA: Diagnosis not present

## 2020-03-05 DIAGNOSIS — R7989 Other specified abnormal findings of blood chemistry: Secondary | ICD-10-CM | POA: Diagnosis not present

## 2020-03-05 DIAGNOSIS — I10 Essential (primary) hypertension: Secondary | ICD-10-CM

## 2020-03-05 NOTE — Patient Instructions (Signed)
It was a pleasure seeing you today.   Today we discussed your elevated calcium and blood pressure   For your elevated calcium: I have obtained blood work, we will call with the results  For your blood pressure: follow up in 2-4 weeks after diet changes. We will hopefully see an improvement in your blood pressure. Check your blood pressure daily at home. If it is >150 systolic (top number), give [place] a call.   Please follow up in 2-4 weeks or sooner if symptoms persist or worsen. Please call the clinic immediately if you have any concerns.   Our clinic's number is [phone]. Please call with questions or concerns.   Please go to the emergency room if you have chest pain, shortness of breath, weakness, facial droop  Thank you,  Oralia Manis, DO

## 2020-03-05 NOTE — Progress Notes (Signed)
SUBJECTIVE:   CHIEF COMPLAINT / HPI:   Hypercalcemia Patient presenting for follow-up of hypercalcemia.  Is asymptomatic at this time.  Had elevated calcium as well as creatinine in last BMP.  Has been increasing her water intake.  Does have a history of a daughter that had significantly increased calcium and had to be hospitalized.  Was told she had a kidney disorder.  Patient does take over-the-counter vitamin D.  Hypertension: - Medications: amlodipine, HTCZ, spironolactone - Compliance: good - Checking BP at home: no - Denies any SOB, CP, vision changes, LE edema, medication SEs, or symptoms of hypotension - Diet: sometimes eats too much salt  - Exercise: daily   Covid vaccine Took 2nd dose of covid vaccine last Friday.   PERTINENT  PMH / PSH: HTN, DM, CKD stage 3  OBJECTIVE:   BP (!) 148/72   Pulse 97   Ht 5' 6" (1.676 m)   Wt 116 lb 6.4 oz (52.8 kg)   SpO2 99%   BMI 18.79 kg/m   Gen: awake and alert, NAD HEENT: PERRL Cardio:RRR, no MRG Resp: CTAB, no wheezes, rales, or rhonchi GI: soft, non tender, non distended, bowel sounds present Ext: no edema  ASSESSMENT/PLAN:   Hypercalcemia Unclear etiology at this time.  Can consider abnormal lab draw.  Unlikely cancer as no other systemic symptoms.  No history of hyper parathyroidism but will obtain PTH to rule this out.  Can consider iatrogenic causes given use of hydrochlorothiazide.  Can consider multiple myeloma as well but no other symptoms at this time and CBC was normal.  Can consider hypothyroidism but no previous history of this.  Can consider sarcoidosis as well as patient is African-American.  Will obtain repeat lab work today including phosphorus level as well as PTH.  Follow-up if abnormal labs.  Can consider referral to nephrology if continues to have elevated creatinine as patient's daughter has a history of kidney disorder.  HYPERTENSION, BENIGN SYSTEMIC Initially elevated but repeat was closer to goal.   Patient requested that we try dietary changes first before increasing medications.  We will plan to decrease salt intake and continue daily exercise.  Follow-up in 2 to 4 weeks so we can see if there is improvement.  Strict return precautions given.  Counseling on health promotion and disease prevention Patient is due for pneumonia vaccine but would like to defer to not interfere with Covid vaccine which she took last Friday.  We will plan to schedule appointment for pneumonia vaccine.  Elevated serum creatinine Will repeat today      , DO [facility]  

## 2020-03-05 NOTE — Assessment & Plan Note (Signed)
Will repeat today.

## 2020-03-05 NOTE — Assessment & Plan Note (Signed)
Unclear etiology at this time.  Can consider abnormal lab draw.  Unlikely cancer as no other systemic symptoms.  No history of hyper parathyroidism but will obtain PTH to rule this out.  Can consider iatrogenic causes given use of hydrochlorothiazide.  Can consider multiple myeloma as well but no other symptoms at this time and CBC was normal.  Can consider hypothyroidism but no previous history of this.  Can consider sarcoidosis as well as patient is African-American.  Will obtain repeat lab work today including phosphorus level as well as PTH.  Follow-up if abnormal labs.  Can consider referral to nephrology if continues to have elevated creatinine as patient's daughter has a history of kidney disorder.

## 2020-03-05 NOTE — Assessment & Plan Note (Signed)
Patient is due for pneumonia vaccine but would like to defer to not interfere with Covid vaccine which she took last Friday.  We will plan to schedule appointment for pneumonia vaccine.

## 2020-03-05 NOTE — Assessment & Plan Note (Signed)
Initially elevated but repeat was closer to goal.  Patient requested that we try dietary changes first before increasing medications.  We will plan to decrease salt intake and continue daily exercise.  Follow-up in 2 to 4 weeks so we can see if there is improvement.  Strict return precautions given.

## 2020-03-06 LAB — COMPREHENSIVE METABOLIC PANEL
ALT: 18 IU/L (ref 0–32)
AST: 19 IU/L (ref 0–40)
Albumin/Globulin Ratio: 1.6 (ref 1.2–2.2)
Albumin: 5 g/dL — ABNORMAL HIGH (ref 3.8–4.8)
Alkaline Phosphatase: 114 IU/L (ref 39–117)
BUN/Creatinine Ratio: 11 — ABNORMAL LOW (ref 12–28)
BUN: 12 mg/dL (ref 8–27)
Bilirubin Total: 0.6 mg/dL (ref 0.0–1.2)
CO2: 23 mmol/L (ref 20–29)
Calcium: 10.6 mg/dL — ABNORMAL HIGH (ref 8.7–10.3)
Chloride: 96 mmol/L (ref 96–106)
Creatinine, Ser: 1.05 mg/dL — ABNORMAL HIGH (ref 0.57–1.00)
GFR calc Af Amer: 63 mL/min/{1.73_m2} (ref >59)
GFR calc non Af Amer: 55 mL/min/{1.73_m2} — ABNORMAL LOW (ref >59)
Globulin, Total: 3.2 g/dL (ref 1.5–4.5)
Glucose: 184 mg/dL — ABNORMAL HIGH (ref 65–99)
Potassium: 4.4 mmol/L (ref 3.5–5.2)
Sodium: 138 mmol/L (ref 134–144)
Total Protein: 8.2 g/dL (ref 6.0–8.5)

## 2020-03-06 LAB — PTH, INTACT AND CALCIUM: PTH: 34 pg/mL (ref 15–65)

## 2020-03-06 LAB — PHOSPHORUS: Phosphorus: 3.7 mg/dL (ref 3.0–4.3)

## 2020-03-27 ENCOUNTER — Other Ambulatory Visit: Payer: Self-pay | Admitting: Family Medicine

## 2020-03-28 ENCOUNTER — Other Ambulatory Visit: Payer: Self-pay | Admitting: Family Medicine

## 2020-04-23 ENCOUNTER — Other Ambulatory Visit: Payer: Self-pay

## 2020-04-23 ENCOUNTER — Encounter: Payer: Self-pay | Admitting: Family Medicine

## 2020-04-23 ENCOUNTER — Ambulatory Visit (INDEPENDENT_AMBULATORY_CARE_PROVIDER_SITE_OTHER): Payer: Medicare HMO | Admitting: Family Medicine

## 2020-04-23 DIAGNOSIS — R252 Cramp and spasm: Secondary | ICD-10-CM

## 2020-04-23 DIAGNOSIS — K5900 Constipation, unspecified: Secondary | ICD-10-CM | POA: Diagnosis not present

## 2020-04-23 MED ORDER — SENNA 8.6 MG PO TABS: 1.0000 | ORAL_TABLET | Freq: Every day | ORAL | 0 refills | Status: DC

## 2020-04-23 MED ORDER — POLYETHYLENE GLYCOL 3350 17 GM/SCOOP PO POWD: 17.0000 g | Freq: Two times a day (BID) | ORAL | 1 refills | Status: DC | PRN

## 2020-04-23 NOTE — Assessment & Plan Note (Signed)
Continues to have cramping.  Will obtain CMP as well as mag.  Advise drinking tonic water nightly.  Also advised a tablespoon of mustard.  Patient reports he would like to avoid medications as much as possible.  Will discontinue ropinirole as it is not helping.  If continues to have cramping can consider medication such as calcium channel blockers, Lyrica, gabapentin.

## 2020-04-23 NOTE — Patient Instructions (Signed)
1. For your cramps you can try a tablespoon of mustard daily. You can also drink tonic water at night  2. I have obtained labs, I will call with the results 3. Follow up in 1 month, if no improvement we can start a new medicine

## 2020-04-23 NOTE — Assessment & Plan Note (Signed)
Will get repeat CMP and ionized calcium today

## 2020-04-23 NOTE — Progress Notes (Signed)
    SUBJECTIVE:   CHIEF COMPLAINT / HPI:   Muscle cramps Patient presenting for follow-up of muscle cramps.  States that she gets muscle cramps in both her hands or legs.  States during the daytime she feels in her hands especially after doing lots of work such as cutting for cooking.  In the evening she feels cramping in her lower extremities especially when she stretches.  Has been taking ropinirole but this did not seem to help.  States she drinks a lot of water, at least 5 bottles a day.  Constipation Patient reports some constipation.  Reports that the MiraLAX was very expensive.  She is hoping to have something extra.  States that she is constipated when she eats beef products.  PERTINENT  PMH / PSH: Hypertension, CKD, constipation,  OBJECTIVE:   BP 139/80   Pulse 80   Ht 5' 5.08" (1.653 m)   Wt 115 lb (52.2 kg)   SpO2 98%   BMI 19.09 kg/m   Gen: awake and alert, NAD Cardio: RRR, no MRG Resp: CTAB, no wheezes, rales or rhonchi  GI: soft, non tender, non distended, bowel sounds present  Ext: no edema, non tender, 55/ muscle strength   ASSESSMENT/PLAN:   Hypercalcemia Will get repeat CMP and ionized calcium today  Cramp of both lower extremities Continues to have cramping.  Will obtain CMP as well as mag.  Advise drinking tonic water nightly.  Also advised a tablespoon of mustard.  Patient reports he would like to avoid medications as much as possible.  Will discontinue ropinirole as it is not helping.  If continues to have cramping can consider medication such as calcium channel blockers, Lyrica, gabapentin.  Constipation Refilled MiraLAX and provided coupon given good Rx.  We will also provide senna.  Follow-up if no improvement.     Oralia Manis, DO [facility]

## 2020-04-23 NOTE — Assessment & Plan Note (Addendum)
Refilled MiraLAX and provided coupon given good Rx.  We will also provide senna.  Follow-up if no improvement.

## 2020-04-24 LAB — MAGNESIUM: Magnesium: 2 mg/dL (ref 1.6–2.3)

## 2020-04-24 LAB — COMPREHENSIVE METABOLIC PANEL
ALT: 15 IU/L (ref 0–32)
AST: 20 IU/L (ref 0–40)
Albumin/Globulin Ratio: 1.7 (ref 1.2–2.2)
Albumin: 5.1 g/dL — ABNORMAL HIGH (ref 3.8–4.8)
Alkaline Phosphatase: 161 IU/L — ABNORMAL HIGH (ref 48–121)
BUN/Creatinine Ratio: 19 (ref 12–28)
BUN: 23 mg/dL (ref 8–27)
Bilirubin Total: 1.1 mg/dL (ref 0.0–1.2)
CO2: 24 mmol/L (ref 20–29)
Calcium: 10.4 mg/dL — ABNORMAL HIGH (ref 8.7–10.3)
Chloride: 94 mmol/L — ABNORMAL LOW (ref 96–106)
Creatinine, Ser: 1.21 mg/dL — ABNORMAL HIGH (ref 0.57–1.00)
GFR calc Af Amer: 53 mL/min/{1.73_m2} — ABNORMAL LOW (ref >59)
GFR calc non Af Amer: 46 mL/min/{1.73_m2} — ABNORMAL LOW (ref >59)
Globulin, Total: 3 g/dL (ref 1.5–4.5)
Glucose: 101 mg/dL — ABNORMAL HIGH (ref 65–99)
Potassium: 4.2 mmol/L (ref 3.5–5.2)
Sodium: 136 mmol/L (ref 134–144)
Total Protein: 8.1 g/dL (ref 6.0–8.5)

## 2020-04-24 LAB — CALCIUM, IONIZED: Calcium, Ion: 5.3 mg/dL (ref 4.5–5.6)

## 2020-04-30 ENCOUNTER — Ambulatory Visit (INDEPENDENT_AMBULATORY_CARE_PROVIDER_SITE_OTHER): Payer: Medicare HMO | Admitting: Family Medicine

## 2020-04-30 ENCOUNTER — Other Ambulatory Visit: Payer: Self-pay

## 2020-04-30 ENCOUNTER — Telehealth: Payer: Self-pay

## 2020-04-30 ENCOUNTER — Encounter: Payer: Self-pay | Admitting: Family Medicine

## 2020-04-30 VITALS — BP 140/66 | HR 84 | Ht 65.0 in | Wt 113.0 lb

## 2020-04-30 DIAGNOSIS — R52 Pain, unspecified: Secondary | ICD-10-CM | POA: Diagnosis not present

## 2020-04-30 DIAGNOSIS — K219 Gastro-esophageal reflux disease without esophagitis: Secondary | ICD-10-CM | POA: Diagnosis not present

## 2020-04-30 MED ORDER — SUCRALFATE 1 G PO TABS: 1.0000 g | ORAL_TABLET | Freq: Two times a day (BID) | ORAL | 1 refills | Status: DC

## 2020-04-30 NOTE — Progress Notes (Signed)
    SUBJECTIVE:   CHIEF COMPLAINT / HPI:   Burning after eating Patient reports having burning sensation after eating grilled foods on Saturday. Reports improved pain today. When she burps she feels some of the pain. When she swallows drinks/water there is a burning sensation. Does report now starting mustard for cramps which may be causing this. Does report pain is worsened with eating. Does not report like anything is stuck but reports some indigestion. Has had this happen before but it went away. Today ate no meat and it is improved. Does take protonix daily. Does not take tums. Did take alka seltzer, but pain is not improved. Pain is improved with belching. Pain is right after she eats or drinks. Does not note eating anything spicy or greasy.   PERTINENT  PMH / PSH: GERD, HTN, CKD3  OBJECTIVE:   BP 140/66   Pulse 84   Ht 5\' 5"  (1.651 m)   Wt 113 lb (51.3 kg)   SpO2 97%   BMI 18.80 kg/m   Gen: awake and alert, NAD GI: soft, non tender, non distended, bowel sounds present Ext: no edema  ASSESSMENT/PLAN:   Pain aggravated by eating or drinking Patient with burning sensation following eating.  Can consider multiple differentials at this time.  Can consider worsening GERD.  Patient has been on Protonix for some time now.  Given history of alendronate use can consider esophagitis.  Will also need to rule out adenocarcinoma.  Will refer to GI at this time as patient needs an endoscopy to rule out the above differentials.  Will give Carafate at this time to help alleviate some pain while she is waiting for GI appointment.  Strict return precautions given.  Follow-up after she sees GI.     , DO [facility]

## 2020-04-30 NOTE — Telephone Encounter (Signed)
Patient calls nurse line regarding burning in chest, onset Saturday evening. Patient reports that burning sensation began after eating. Patient reports history of acid reflux. Patient denies chest pain, shortness of breath, and arm pain. Patient is able to speak in complete sentences and does not appear to be in any distress.   Patient scheduled this morning with PCP at 1110.   ED precautions given.   Veronda Prude, RN

## 2020-04-30 NOTE — Patient Instructions (Signed)

## 2020-04-30 NOTE — Assessment & Plan Note (Signed)
Patient with burning sensation following eating.  Can consider multiple differentials at this time.  Can consider worsening GERD.  Patient has been on Protonix for some time now.  Given history of alendronate use can consider esophagitis.  Will also need to rule out adenocarcinoma.  Will refer to GI at this time as patient needs an endoscopy to rule out the above differentials.  Will give Carafate at this time to help alleviate some pain while she is waiting for GI appointment.  Strict return precautions given.  Follow-up after she sees GI.

## 2020-05-23 ENCOUNTER — Other Ambulatory Visit: Payer: Self-pay

## 2020-05-23 MED ORDER — SUCRALFATE 1 G PO TABS: 1.0000 g | ORAL_TABLET | Freq: Two times a day (BID) | ORAL | 1 refills | Status: DC

## 2020-05-26 ENCOUNTER — Other Ambulatory Visit: Payer: Self-pay | Admitting: Family Medicine

## 2020-05-26 DIAGNOSIS — I10 Essential (primary) hypertension: Secondary | ICD-10-CM

## 2020-06-06 ENCOUNTER — Encounter: Payer: Self-pay | Admitting: Family Medicine

## 2020-06-07 ENCOUNTER — Telehealth: Payer: Self-pay | Admitting: *Deleted

## 2020-06-07 ENCOUNTER — Other Ambulatory Visit: Payer: Self-pay | Admitting: Family Medicine

## 2020-06-07 DIAGNOSIS — I10 Essential (primary) hypertension: Secondary | ICD-10-CM

## 2020-06-07 DIAGNOSIS — H9190 Unspecified hearing loss, unspecified ear: Secondary | ICD-10-CM | POA: Insufficient documentation

## 2020-06-07 NOTE — Telephone Encounter (Signed)
Spoke with pt and she states that she no longer needs to go to Gi because the medicine she was given works. Also that she would call them if she does need them Maree Erie, CMA

## 2020-06-07 NOTE — Telephone Encounter (Signed)
-----   Message from Latrelle Dodrill, MD sent at 06/07/2020 10:35 AM EDT ----- Covering Dr. Sherran Needs inbox after her graduation. Got message that this patient has not been able to be contacted by GI but needs to see them. Can you call patient and assist her with contacting GI office?  Latrelle Dodrill, MD

## 2020-06-12 ENCOUNTER — Ambulatory Visit (INDEPENDENT_AMBULATORY_CARE_PROVIDER_SITE_OTHER): Payer: Medicare HMO | Admitting: Family Medicine

## 2020-06-12 ENCOUNTER — Other Ambulatory Visit: Payer: Self-pay

## 2020-06-12 DIAGNOSIS — I10 Essential (primary) hypertension: Secondary | ICD-10-CM

## 2020-06-12 DIAGNOSIS — R252 Cramp and spasm: Secondary | ICD-10-CM

## 2020-06-12 NOTE — Assessment & Plan Note (Signed)
BP is not well-controlled, possibly due to non-adherence. Patient unsure of medications, will not make any adjustments today but requested patient bring them on her next visit. Also advised to bring in home BP cuff.

## 2020-06-12 NOTE — Patient Instructions (Signed)
Nice to meet you and thank you for coming in.   For the muscle cramps:  --Stretching exercises several times a day and before bed --Regular exercises throughout the day  Please bring all medication bottles to next visit, I would like for you to be seen in the next few weeks with me or your PCP.  High Blood Pressure --Monitor your blood pressure at home, please call the office if greater 160-170/95-100.    --Please bring in your home blood pressure cuff at your next visit.

## 2020-06-12 NOTE — Progress Notes (Signed)
    SUBJECTIVE:   CHIEF COMPLAINT / HPI:    Muscle Spasms:  Patient reported that she has been having cramps in her calf for the last 2 months that she reports is worse at night and wakes her from sleep. She states the cramps mainly occur in her left leg but she feels that the cramps are lengthening in duration at night and now also present during the day. She has been seen previously for this and has tried Ropinirole, tonic water, and mustard before bed with no alleviation. She is on medications that may be contributing to this but needs to bring them in for a med rec as her record states she is currently on Atorvastatin 40mg  and Pravastatin 20mg  concurrently. She does report that sometimes stretching out her legs at night can alleviate some of the cramps.   Hypertension: BP was elevated in clinic today, patient reports that she is not sure if her BP cuff at home is accurate for monitoring.   PERTINENT  PMH / PSH: Unsure of her current medications.  OBJECTIVE:   BP (!) 162/72   Pulse 93   Wt 113 lb (51.3 kg)   SpO2 98%   BMI 18.80 kg/m   General: alert, NAD MSK: no calf tenderness, able to walk on toes and heels with no pain, able to perform a squat easily.  ASSESSMENT/PLAN:   HYPERTENSION, BENIGN SYSTEMIC BP is not well-controlled, possibly due to non-adherence. Patient unsure of medications, will not make any adjustments today but requested patient bring them on her next visit. Also advised to bring in home BP cuff.  Cramp of both lower extremities Cramps not well controlled, unsure of cause. Previous labs including Ca and CMP were normal, consider medications as cause as she has 2 statins (Atorvastatin 40mg  and Pravastatin 20mg ) listed on her meds as well as Spironolactone and HCTZ. Demonstrated calf stretches to do before bedtime and encouraged daily exercise.  May consider CCB, Lyrica, or Gabapentin in the future if not improving.     , DO [facility]

## 2020-06-12 NOTE — Assessment & Plan Note (Signed)
Cramps not well controlled, unsure of cause. Previous labs including Ca and CMP were normal, consider medications as cause as she has 2 statins (Atorvastatin 40mg  and Pravastatin 20mg ) listed on her meds as well as Spironolactone and HCTZ. Demonstrated calf stretches to do before bedtime and encouraged daily exercise.  May consider CCB, Lyrica, or Gabapentin in the future if not improving.

## 2020-06-26 ENCOUNTER — Other Ambulatory Visit: Payer: Self-pay

## 2020-06-26 ENCOUNTER — Ambulatory Visit (INDEPENDENT_AMBULATORY_CARE_PROVIDER_SITE_OTHER): Payer: Medicare HMO | Admitting: Family Medicine

## 2020-06-26 ENCOUNTER — Encounter: Payer: Self-pay | Admitting: Family Medicine

## 2020-06-26 VITALS — BP 130/62 | HR 82 | Ht 65.0 in | Wt 115.5 lb

## 2020-06-26 DIAGNOSIS — R252 Cramp and spasm: Secondary | ICD-10-CM

## 2020-06-26 DIAGNOSIS — Z8639 Personal history of other endocrine, nutritional and metabolic disease: Secondary | ICD-10-CM

## 2020-06-26 DIAGNOSIS — E1169 Type 2 diabetes mellitus with other specified complication: Secondary | ICD-10-CM

## 2020-06-26 DIAGNOSIS — E785 Hyperlipidemia, unspecified: Secondary | ICD-10-CM | POA: Diagnosis not present

## 2020-06-26 DIAGNOSIS — K219 Gastro-esophageal reflux disease without esophagitis: Secondary | ICD-10-CM

## 2020-06-26 LAB — POCT UA - MICROALBUMIN
Albumin/Creatinine Ratio, Urine, POC: <30
Creatinine, POC: 10 mg/dL
Microalbumin Ur, POC: 10 mg/L

## 2020-06-26 MED ORDER — FAMOTIDINE 40 MG PO TABS: 40.0000 mg | ORAL_TABLET | Freq: Every day | ORAL | 2 refills | Status: DC

## 2020-06-26 NOTE — Patient Instructions (Signed)
Coenzyme Q10 (CoQ10) is an antioxidant that your body produces naturally. Your cells use CoQ10 for growth and maintenance.  Levels of CoQ10 in your body decrease as you age. CoQ10 levels have also been found to be lower in people with certain conditions, such as heart disease, and in those who take cholesterol-lowering drugs called statins.  CoQ10 is found in meat, fish and nuts. The amount of CoQ10 found in these dietary sources, however, isn't enough to significantly increase CoQ10 levels in your body.  CoQ10 dietary supplements are available as capsules, chewable tablets, liquid syrups, wafers and by IV. CoQ10 might help prevent or treat certain heart conditions, as well as migraine headaches.   This may help with the muscle cramps. Please continue to drink plenty of water. You can also try drinking low sugar gatorade to make sure your electrolytes are not depleted.   Please return in 2 weeks if your symptoms do not improve with the new vitamin.  Dr. Roxan Hockey

## 2020-06-26 NOTE — Progress Notes (Signed)
    SUBJECTIVE:   CHIEF COMPLAINT / HPI:  Pain in legs  Patient reports that she has been leg cramps for close to 2 months now.  States that the cramps in her legs are worse at night.  She sometimes experiences soreness in her hands.  She adds that at night, her leg muscles seem to tighten and she has to and feet in order to stop the cramping.  She reports that she drinks multiple bottles of water during the day and at least 3 bottles at night prior to going to bed.  Patient is currently on atorvastatin which we discussed may contribute to her cramps.  DM  Patient reports that her blood glucose measurements were doing well and her hemoglobin A1c improved so she was taken off of her diabetes medications.  She is no longer checking her blood sugars at home.  Patient is agreeable to giving urine sample for microalbumin checked today.  She denies any increased urinary frequency or decreased urinary frequency, headaches or dizziness.  GERD Discussed with patient prolonged use of Protonix and recommended changing to Pepcid.  Patient was agreeable to this and states that her symptoms have been improved.  Agreed to follow-up if symptoms worsen without pantoprazole.  PERTINENT  PMH / PSH:  CKD, stage III Hypertension Hyperlipidemia GERD  OBJECTIVE:   BP 130/62   Pulse 82   Ht 5\' 5"  (1.651 m)   Wt 115 lb 8 oz (52.4 kg)   SpO2 99%   BMI 19.22 kg/m   General: female appearing stated age in no acute distress, frequently rubbing calf muscles Cardio: Normal S1 and S2, no S3 or S4. Rhythm is regular. No murmurs or rubs.  Bilateral radial pulses palpable Pulm: Clear to auscultation bilaterally, no crackles, wheezing, or diminished breath sounds. Normal respiratory effort Abdomen: Bowel sounds normal. Abdomen soft and non-tender. Extremities: Trace lower extremity edema, no erythema, no warmth to touch, demonstrates normal range of motion  of bilateral lower extremities and normal gait Neuro: pt  alert and oriented x4, sensation intact throughout   ASSESSMENT/PLAN:   GERD (gastroesophageal reflux disease) Discontinue pantoprazole -Prescribed Pepcid 40 mg daily  Cramp of both lower extremities Patient continues to have cramping of bilateral lower extremities mostly at night.  Given patient's use of atorvastatin, will give a trial of co-Q10 for associated myositis. -Recommended patient purchase co-enzyme 10  -Follow-up in 2 weeks to reassess  - patient agreeable with this plan  -Encourage patient to remain hydrated  Hx of diabetes mellitus Patient has been taken off of diabetes medication.  Last hemoglobin A1c in nondiabetic range. -Microalbumin checked today, normal     , MD [facility]

## 2020-06-27 ENCOUNTER — Other Ambulatory Visit: Payer: Self-pay | Admitting: Family Medicine

## 2020-06-30 NOTE — Assessment & Plan Note (Addendum)
Patient has been taken off of diabetes medication.  Last hemoglobin A1c was 6.14 months ago. -Microalbumin checked today, normal

## 2020-06-30 NOTE — Assessment & Plan Note (Signed)
Discontinue pantoprazole -Prescribed Pepcid 40 mg daily

## 2020-06-30 NOTE — Assessment & Plan Note (Addendum)
Patient continues to have cramping of bilateral lower extremities mostly at night.  Given patient's use of atorvastatin, will give a trial of co-Q10 for associated myositis. -Recommended patient purchase co-enzyme 10  -Follow-up in 2 weeks to reassess  - patient agreeable with this plan  -Encourage patient to remain hydrated

## 2020-07-17 ENCOUNTER — Other Ambulatory Visit: Payer: Self-pay

## 2020-07-17 ENCOUNTER — Encounter: Payer: Self-pay | Admitting: Family Medicine

## 2020-07-17 ENCOUNTER — Ambulatory Visit (INDEPENDENT_AMBULATORY_CARE_PROVIDER_SITE_OTHER): Payer: Medicare HMO | Admitting: Family Medicine

## 2020-07-17 VITALS — BP 130/64 | HR 94 | Ht 65.0 in | Wt 114.2 lb

## 2020-07-17 DIAGNOSIS — R252 Cramp and spasm: Secondary | ICD-10-CM | POA: Diagnosis not present

## 2020-07-17 MED ORDER — GABAPENTIN 100 MG PO CAPS: 100.0000 mg | ORAL_CAPSULE | Freq: Every day | ORAL | 0 refills | Status: DC

## 2020-07-17 NOTE — Progress Notes (Signed)
° ° °  SUBJECTIVE:   CHIEF COMPLAINT / HPI: Legs cramping   Pamela [name] is a 69 year old female presenting for follow-up of leg cramping.    Leg cramping: She was recently seen on 7/6 and 7/20 for this concern, reports an approximate 2-3 months history of bilateral leg cramping.  Discussed trialing co-Q10 in addition to her atorvastatin to see if this improved her pains (and additionally stopped double accidental statin). She had also been seen for this about 1 year ago started on Requip for restless leg syndrome however this was ineffective.   Today, she continues to have bilateral muscle cramps (L>R) many times at night.  She has to wake up, stretch, and walk around her room to make it go away.  She used to try mustard but that also gave her reflux problems. She drinks plenty of water.  Has not noticed any difference with the co-Q10.  Takes a multivitamin.  Exercises, stationary bike, does not stretch afterwards.  No weakness, numbness or tingling of her extremities.  Hypercalcemia/elevated alk phos: Recently had labs in May, provided that time wanted to repeat in 1 month with additional GGT given mild alk phos elevation.   PERTINENT  PMH / PSH: CKD stage III, osteoporosis, hypertension, GERD  OBJECTIVE:   BP 130/64    Pulse 94    Ht '5\' 5"'$  (1.651 m)    Wt 114 lb 3.2 oz (51.8 kg)    SpO2 100%    BMI 19.00 kg/m   General: Alert, NAD HEENT: NCAT Lungs: No increased WOB  Msk: Moves all extremities spontaneously, nontender to palpation of bilateral lower extremity, sensation to bilateral extremity and feet intact to light touch, proprioception appropriate.  5/5 BLE strength. Ext: Warm, dry, 2+ dorsalis pedis and posterior tibialis pulses, no edema bilaterally Derm: No rash or bruising seen  ASSESSMENT/PLAN:   Nocturnal muscle cramp Bilateral and chronic.  Less likely restless leg syndrome given intermittent nature and description more consistent with the above.  Discussed utmost importance of  daily leg stretching after exercise and prior to bedtime for prevention, in addition to adequate hydration.  Will also trial starting vitamin B complex and vitamin E (check MVI first if already receiving) and a glass of tonic water prior to bedtime.  If no improvement with these measures over the next 2-3 weeks, gave printed prescription of gabapentin 100 mg to trial nightly (can titrate to maximum of 300 mg).  Educated on common SE.  Follow-up in 1-2 months if no improvement with all of the above.  Hypercalcemia Mild (last 10.4, actually corrected normal with albumin), associated with mildly elevated alkaline phosphatase in 04/2020. ?If contributing to above, but less likely.  Recommended repeat labs at that time, will go ahead and get repeat CMP and GGT today.    Follow-up in 1-2 months if not improving or sooner if needed.  Pamela [name], [place]

## 2020-07-17 NOTE — Assessment & Plan Note (Signed)
Bilateral and chronic.  Less likely restless leg syndrome given intermittent nature and description more consistent with the above.  Discussed utmost importance of daily leg stretching after exercise and prior to bedtime for prevention, in addition to adequate hydration.  Will also trial starting vitamin B complex and vitamin E (check MVI first if already receiving) and a glass of tonic water prior to bedtime.  If no improvement with these measures over the next 2-3 weeks, gave printed prescription of gabapentin 100 mg to trial nightly (can titrate to maximum of 300 mg).  Educated on common SE.  Follow-up in 1-2 months if no improvement with all of the above.

## 2020-07-17 NOTE — Patient Instructions (Addendum)
It was wonderful to meet you today.  I recommend the following:  Start doing leg stretches every single day before bedtime, including stretching your calfs, quadriceps, and hamstrings.  You can find many videos on YouTube.  This is very important and can be very successful at reducing your cramps.  Additionally, I encourage you to try drinking a glass of tonic water every single night before bedtime.  You could also continue using a multivitamin or can also try vitamin B complex (three times daily, containing 30 mg of vitamin B6) or vitamin E (400 international units before bed).    Lastly, I have printed a prescription of gabapentin which is a nerve medicine.  This can make you drowsy, however fortunately would be taken at night.  If you feel no improvement you can increase up to 3 tablets total over the course of a few weeks.  I encourage you to try the above regimen before filling the gabapentin for a few weeks.

## 2020-07-17 NOTE — Assessment & Plan Note (Signed)
Mild (last 10.4, actually corrected normal with albumin), associated with mildly elevated alkaline phosphatase in 04/2020. ?If contributing to above, but less likely.  Recommended repeat labs at that time, will go ahead and get repeat CMP and GGT today.

## 2020-07-18 LAB — COMPREHENSIVE METABOLIC PANEL
ALT: 14 IU/L (ref 0–32)
AST: 16 IU/L (ref 0–40)
Albumin/Globulin Ratio: 1.5 (ref 1.2–2.2)
Albumin: 4.9 g/dL — ABNORMAL HIGH (ref 3.8–4.8)
Alkaline Phosphatase: 175 IU/L — ABNORMAL HIGH (ref 48–121)
BUN/Creatinine Ratio: 16 (ref 12–28)
BUN: 18 mg/dL (ref 8–27)
Bilirubin Total: 0.7 mg/dL (ref 0.0–1.2)
CO2: 24 mmol/L (ref 20–29)
Calcium: 10.1 mg/dL (ref 8.7–10.3)
Chloride: 97 mmol/L (ref 96–106)
Creatinine, Ser: 1.13 mg/dL — ABNORMAL HIGH (ref 0.57–1.00)
GFR calc Af Amer: 58 mL/min/{1.73_m2} — ABNORMAL LOW (ref >59)
GFR calc non Af Amer: 50 mL/min/{1.73_m2} — ABNORMAL LOW (ref >59)
Globulin, Total: 3.2 g/dL (ref 1.5–4.5)
Glucose: 171 mg/dL — ABNORMAL HIGH (ref 65–99)
Potassium: 4.6 mmol/L (ref 3.5–5.2)
Sodium: 135 mmol/L (ref 134–144)
Total Protein: 8.1 g/dL (ref 6.0–8.5)

## 2020-07-18 LAB — GAMMA GT: GGT: 83 IU/L — ABNORMAL HIGH (ref 0–60)

## 2020-07-24 ENCOUNTER — Other Ambulatory Visit: Payer: Self-pay | Admitting: Family Medicine

## 2020-07-24 DIAGNOSIS — R748 Abnormal levels of other serum enzymes: Secondary | ICD-10-CM

## 2020-07-25 ENCOUNTER — Telehealth: Payer: Self-pay | Admitting: *Deleted

## 2020-07-25 DIAGNOSIS — K5900 Constipation, unspecified: Secondary | ICD-10-CM | POA: Diagnosis not present

## 2020-07-25 DIAGNOSIS — Z7722 Contact with and (suspected) exposure to environmental tobacco smoke (acute) (chronic): Secondary | ICD-10-CM | POA: Diagnosis not present

## 2020-07-25 DIAGNOSIS — K219 Gastro-esophageal reflux disease without esophagitis: Secondary | ICD-10-CM | POA: Diagnosis not present

## 2020-07-25 DIAGNOSIS — Z008 Encounter for other general examination: Secondary | ICD-10-CM | POA: Diagnosis not present

## 2020-07-25 DIAGNOSIS — Z7982 Long term (current) use of aspirin: Secondary | ICD-10-CM | POA: Diagnosis not present

## 2020-07-25 DIAGNOSIS — E119 Type 2 diabetes mellitus without complications: Secondary | ICD-10-CM | POA: Diagnosis not present

## 2020-07-25 DIAGNOSIS — M1990 Unspecified osteoarthritis, unspecified site: Secondary | ICD-10-CM | POA: Diagnosis not present

## 2020-07-25 DIAGNOSIS — Z809 Family history of malignant neoplasm, unspecified: Secondary | ICD-10-CM | POA: Diagnosis not present

## 2020-07-25 DIAGNOSIS — I951 Orthostatic hypotension: Secondary | ICD-10-CM | POA: Diagnosis not present

## 2020-07-25 DIAGNOSIS — I10 Essential (primary) hypertension: Secondary | ICD-10-CM | POA: Diagnosis not present

## 2020-07-25 DIAGNOSIS — E785 Hyperlipidemia, unspecified: Secondary | ICD-10-CM | POA: Diagnosis not present

## 2020-07-25 NOTE — Telephone Encounter (Signed)
-----   Message from Allayne Stack, DO sent at 07/24/2020  4:37 PM EDT ----- Discussed results with patient, GGT elevated with mildly elevated alkaline phosphatase consistently.  CR at baseline.  Will obtain RUQ U/S for further evaluation.  Can we please get her scheduled for an RUQ U/S?  Thank you, Dr.Beard

## 2020-07-25 NOTE — Telephone Encounter (Signed)
Pt already scheduled and informed by GI. Aaron Boeh, CMA  

## 2020-07-26 ENCOUNTER — Other Ambulatory Visit: Payer: Self-pay | Admitting: *Deleted

## 2020-07-26 DIAGNOSIS — I10 Essential (primary) hypertension: Secondary | ICD-10-CM

## 2020-07-26 MED ORDER — SPIRONOLACTONE 50 MG PO TABS: 50.0000 mg | ORAL_TABLET | Freq: Every day | ORAL | 3 refills | Status: DC

## 2020-08-02 ENCOUNTER — Other Ambulatory Visit: Payer: Medicare HMO

## 2020-08-08 ENCOUNTER — Ambulatory Visit
Admission: RE | Admit: 2020-08-08 | Discharge: 2020-08-08 | Disposition: A | Discharge status: Home / Self Care | Payer: Medicare HMO | Source: Ambulatory Visit | Attending: Family Medicine | Admitting: Family Medicine

## 2020-08-08 DIAGNOSIS — R748 Abnormal levels of other serum enzymes: Secondary | ICD-10-CM | POA: Diagnosis not present

## 2020-08-27 ENCOUNTER — Other Ambulatory Visit: Payer: Self-pay | Admitting: Family Medicine

## 2020-08-27 DIAGNOSIS — Z1231 Encounter for screening mammogram for malignant neoplasm of breast: Secondary | ICD-10-CM

## 2020-09-12 ENCOUNTER — Ambulatory Visit: Payer: Medicare HMO

## 2020-09-12 ENCOUNTER — Ambulatory Visit (INDEPENDENT_AMBULATORY_CARE_PROVIDER_SITE_OTHER): Payer: Medicare HMO

## 2020-09-12 ENCOUNTER — Other Ambulatory Visit: Payer: Self-pay

## 2020-09-12 DIAGNOSIS — Z23 Encounter for immunization: Secondary | ICD-10-CM

## 2020-09-12 NOTE — Progress Notes (Signed)
Patient presents for Flu Vaccine.   Flu Vaccine administered RD without complication.   See admin for details.

## 2020-09-17 ENCOUNTER — Other Ambulatory Visit: Payer: Self-pay | Admitting: Family Medicine

## 2020-09-20 ENCOUNTER — Other Ambulatory Visit: Payer: Self-pay

## 2020-09-20 ENCOUNTER — Ambulatory Visit
Admission: RE | Admit: 2020-09-20 | Discharge: 2020-09-20 | Disposition: A | Discharge status: Home / Self Care | Payer: Medicare HMO | Source: Ambulatory Visit | Attending: Family Medicine | Admitting: Family Medicine

## 2020-09-20 DIAGNOSIS — Z1231 Encounter for screening mammogram for malignant neoplasm of breast: Secondary | ICD-10-CM

## 2020-11-05 ENCOUNTER — Telehealth: Payer: Self-pay | Admitting: *Deleted

## 2020-11-05 NOTE — Telephone Encounter (Signed)
Rx request for pantoprazole 40mg  tabs. Not on med list. Please advise. Roderic Lammert , CMA

## 2020-11-07 NOTE — Progress Notes (Signed)
SUBJECTIVE:   CHIEF COMPLAINT / HPI: leg cramps   Patient reports continued leg cramps that occur mostly during the night into the early morning  She is currently on lipoitor 40mg  with last lipid panel within goal LDL <70  Patient previously tried CoQ10 with no changes in cramps   She has no trouble with climbing stairs She denies any dysphagia  She reports no difficulty with standing from chair The cramps include tightening of muscles that lasts for less than a minute but does not report generalized muscle soreness  She denies paresthesias or muscle soreness   GERD Patient reports that her gastric reflux symptoms are well controlled using pepcid. She reports that she does not need to restart pantoprazole.   Chronic HTN Home BP Monitoring - no, needs more batteries in the cuff that she has. She has an arm cuff.  Chest pain- no     Dyspnea- no  Headache - no   Medications: amlodipine 10, spironolactone 50, HCTZ 25  Compliance-  no       Lightheadedness-  no               Edema- no   Preventitive Healthcare:  Exercise: yes; yard work and has exercise machine at home    Hx of DM Diet controlled Last A1c:  Lab Results  Component Value Date   HGBA1C 6.1 02/21/2020   HLD  We will check lipid panel in 3 months after medication change.  Patient reports adherence with statin therapy.  Discussed that this may be potentially contributing to her leg cramps.  PERTINENT  PMH / PSH:  History of diabetes Hyperlipidemia Hypertension with chronic kidney disease, stage IIIb GERD Muscle cramps  OBJECTIVE:   BP (!) 146/72   Pulse 90   Wt 114 lb (51.7 kg)   SpO2 98%   BMI 18.97 kg/m    Physical exam General: Female appearing younger than stated age in no acute distress HEENT: MMM, no oral lesions noted,Neck non-tender without lymphadenopathy Cardio: Normal S1 and S2, no S3 or S4. Rhythm is regular. No murmurs or rubs.  Bilateral radial pulses palpable Pulm: Clear to  auscultation bilaterally, no crackles, wheezing, or diminished breath sounds. Normal respiratory effort Abdomen: Bowel sounds normal. Abdomen soft and non-tender.  Extremities: No peripheral edema. Warm & well perfused.  Patient demonstrates 5/5 strength in bilateral lower extremities, neurovascularly intact with palpable posterior tibialis pulses, no skin changes or bony deformities noted   ASSESSMENT/PLAN:   Restless leg Given patient is description of legs feel like they need to move frequently and historical component of leg cramps mostly at night, patient may have restless leg syndrome and is open to a trial of Requip to see if it helps with her symptoms. -Prescriber Requip 0.25 mg -Check CK in case there is some evidence of muscle breakdown contributing to her symptoms  Hx of diabetes mellitus Patient's last hemoglobin A1c was 6.1 in March 2021.  We will check BMP and glucose today with consideration for checking A1c at follow-up visit if glucose is elevated.  Patient is not experiencing any symptoms of polydipsia, polyuria, dizziness or frequent headaches.  She does report however having a mild headache when drinking sugary beverages that resolves after drinking water.  HLD (hyperlipidemia) Given patient's symptoms, there may be a component of statin therapy contributing to her leg cramps.  Discussed trial of rosuvastatin instead of atorvastatin to see if it helps with her symptoms.  Patient was agreeable with this  plan -Prescribed rosuvastatin 20 mg -Plan to recheck lipid panel in 3 months  HYPERTENSION, BENIGN SYSTEMIC Patient to continue amlodipine 10, spironolactone 50 and hydrochlorothiazide 25 daily She does not report any symptoms of chest pain, dyspnea, vision changes or lightheadedness nor edema Will consider changing hydrochlorothiazide to different diuretic if patient symptoms persist after trial of different statin therapy and Requip BMP today  GERD (gastroesophageal  reflux disease) Symptoms well controlled on Pepcid only.   Follow-up in 4 weeks  Ronnald Ramp, MD [facility]

## 2020-11-07 NOTE — Telephone Encounter (Signed)
Patient has appointment with me on 11/08/20. We will discuss this medication at that time.

## 2020-11-08 ENCOUNTER — Other Ambulatory Visit: Payer: Self-pay

## 2020-11-08 ENCOUNTER — Encounter: Payer: Self-pay | Admitting: Family Medicine

## 2020-11-08 ENCOUNTER — Ambulatory Visit (INDEPENDENT_AMBULATORY_CARE_PROVIDER_SITE_OTHER): Payer: Medicare HMO | Admitting: Family Medicine

## 2020-11-08 ENCOUNTER — Other Ambulatory Visit: Payer: Self-pay | Admitting: Family Medicine

## 2020-11-08 VITALS — BP 146/72 | HR 90 | Wt 114.0 lb

## 2020-11-08 DIAGNOSIS — I10 Essential (primary) hypertension: Secondary | ICD-10-CM | POA: Diagnosis not present

## 2020-11-08 DIAGNOSIS — G2581 Restless legs syndrome: Secondary | ICD-10-CM | POA: Diagnosis not present

## 2020-11-08 DIAGNOSIS — K219 Gastro-esophageal reflux disease without esophagitis: Secondary | ICD-10-CM | POA: Diagnosis not present

## 2020-11-08 DIAGNOSIS — Z9229 Personal history of other drug therapy: Secondary | ICD-10-CM

## 2020-11-08 DIAGNOSIS — Z23 Encounter for immunization: Secondary | ICD-10-CM

## 2020-11-08 DIAGNOSIS — E785 Hyperlipidemia, unspecified: Secondary | ICD-10-CM

## 2020-11-08 DIAGNOSIS — Z8639 Personal history of other endocrine, nutritional and metabolic disease: Secondary | ICD-10-CM | POA: Diagnosis not present

## 2020-11-08 MED ORDER — ROSUVASTATIN CALCIUM 5 MG PO TABS: 5.0000 mg | ORAL_TABLET | Freq: Every day | ORAL | 3 refills | Status: DC

## 2020-11-08 MED ORDER — ROSUVASTATIN CALCIUM 20 MG PO TABS: 20.0000 mg | ORAL_TABLET | Freq: Every day | ORAL | 0 refills | Status: DC

## 2020-11-08 MED ORDER — ROPINIROLE HCL 0.25 MG PO TABS: 0.2500 mg | ORAL_TABLET | Freq: Every day | ORAL | 0 refills | Status: DC

## 2020-11-08 NOTE — Patient Instructions (Signed)
We will change your cholesterol medication to Crestor 5 mg daily.  Please continue to take this every day to help keep your cholesterol low.  For your blood pressure, recommend checking her blood pressure at least once daily and keeping a journal of this so that we can discuss it at your next visit.  We will also do blood work today to make sure your electrolyte levels are within normal limits and check for any muscle breakdown due to this long history of leg cramps.  For your restless leg syndrome, I have prescribed Requip.  Please follow-up with me in 4 weeks to check on your blood pressure and cramps.

## 2020-11-08 NOTE — Progress Notes (Signed)
Crestor dosing updated to 20mg .

## 2020-11-09 LAB — BASIC METABOLIC PANEL
BUN/Creatinine Ratio: 18 (ref 12–28)
BUN: 19 mg/dL (ref 8–27)
CO2: 23 mmol/L (ref 20–29)
Calcium: 10.2 mg/dL (ref 8.7–10.3)
Chloride: 92 mmol/L — ABNORMAL LOW (ref 96–106)
Creatinine, Ser: 1.05 mg/dL — ABNORMAL HIGH (ref 0.57–1.00)
GFR calc Af Amer: 63 mL/min/{1.73_m2} (ref >59)
GFR calc non Af Amer: 54 mL/min/{1.73_m2} — ABNORMAL LOW (ref >59)
Glucose: 160 mg/dL — ABNORMAL HIGH (ref 65–99)
Potassium: 4.6 mmol/L (ref 3.5–5.2)
Sodium: 134 mmol/L (ref 134–144)

## 2020-11-09 LAB — CK: Total CK: 67 U/L (ref 32–182)

## 2020-11-09 NOTE — Assessment & Plan Note (Addendum)
Given patient's symptoms, there may be a component of statin therapy contributing to her leg cramps.  Discussed trial of rosuvastatin instead of atorvastatin to see if it helps with her symptoms.  Patient was agreeable with this plan -Prescribed rosuvastatin 20 mg -Plan to recheck lipid panel in 3 months

## 2020-11-09 NOTE — Assessment & Plan Note (Signed)
Given patient is description of legs feel like they need to move frequently and historical component of leg cramps mostly at night, patient may have restless leg syndrome and is open to a trial of Requip to see if it helps with her symptoms. -Prescriber Requip 0.25 mg -Check CK in case there is some evidence of muscle breakdown contributing to her symptoms

## 2020-11-09 NOTE — Assessment & Plan Note (Addendum)
Patient to continue amlodipine 10, spironolactone 50 and hydrochlorothiazide 25 daily She does not report any symptoms of chest pain, dyspnea, vision changes or lightheadedness nor edema Will consider changing hydrochlorothiazide to different diuretic if patient symptoms persist after trial of different statin therapy and Requip BMP today

## 2020-11-09 NOTE — Assessment & Plan Note (Signed)
Symptoms well controlled on Pepcid only.

## 2020-11-09 NOTE — Assessment & Plan Note (Signed)
Patient's last hemoglobin A1c was 6.1 in March 2021.  We will check BMP and glucose today with consideration for checking A1c at follow-up visit if glucose is elevated.  Patient is not experiencing any symptoms of polydipsia, polyuria, dizziness or frequent headaches.  She does report however having a mild headache when drinking sugary beverages that resolves after drinking water.

## 2020-11-30 ENCOUNTER — Other Ambulatory Visit: Payer: Self-pay | Admitting: Family Medicine

## 2020-12-10 ENCOUNTER — Ambulatory Visit: Payer: Medicare HMO | Admitting: Family Medicine

## 2020-12-10 NOTE — Progress Notes (Deleted)
    SUBJECTIVE:   CHIEF COMPLAINT / HPI: HA after starting medication   Elani Delph reports that she has been experiencing HA as frequently as *** since starting ***>  She describes the HA as ***   PERTINENT  PMH / PSH:  HTN  Nocturnal Leg cramps  Hx of DM  OBJECTIVE:   There were no vitals taken for this visit.  General: female appearing stated age in no acute distress HEENT: MMM, no oral lesions noted,Neck non-tender without lymphadenopathy, masses or thyromegaly*** Cardio: Normal S1 and S2, no S3 or S4. Rhythm is regular***. No murmurs or rubs.  Bilateral radial pulses palpable Pulm: Clear to auscultation bilaterally, no crackles, wheezing, or diminished breath sounds. Normal respiratory effort, stable on *** Abdomen: Bowel sounds normal. Abdomen soft and non-tender. *** Extremities: No peripheral edema. Warm/ well perfused. *** Neuro: pt alert and oriented x4    ASSESSMENT/PLAN:   No problem-specific Assessment & Plan notes found for this encounter.     Ronnald Ramp, MD [facility]

## 2020-12-13 ENCOUNTER — Other Ambulatory Visit: Payer: Self-pay | Admitting: Family Medicine

## 2020-12-16 NOTE — Progress Notes (Deleted)
    SUBJECTIVE:   CHIEF COMPLAINT / HPI: HA  Ilona Colley is a 70 year old female with hx of bilateral LE cramps, HTN and Stage 3 CKD presenting with HA that she is concerned may be due to her new medication. She was started on Requip on 12/03/20 for symptoms concerning for Restless legs and since has been experiencing ***   Given her hx of DM and CKD, patient was offered Jardiance as renal protective medication. ***  PERTINENT  PMH / PSH:  HTN  GERD  Diverticulosis  Stage 3 CKD  OBJECTIVE:   There were no vitals taken for this visit.  General: female appearing stated age in no acute distress HEENT: MMM, no oral lesions noted,Neck non-tender without lymphadenopathy, masses or thyromegaly*** Cardio: Normal S1 and S2, no S3 or S4. Rhythm is regular***. No murmurs or rubs.  Bilateral radial pulses palpable Pulm: Clear to auscultation bilaterally, no crackles, wheezing, or diminished breath sounds. Normal respiratory effort, stable on *** Abdomen: Bowel sounds normal. Abdomen soft and non-tender. *** Extremities: No peripheral edema. Warm/ well perfused. *** Neuro: pt alert and oriented x4 ***  ASSESSMENT/PLAN:   No problem-specific Assessment & Plan notes found for this encounter.     Ronnald Ramp, MD [facility]

## 2020-12-17 ENCOUNTER — Encounter: Payer: Self-pay | Admitting: Family Medicine

## 2020-12-17 ENCOUNTER — Ambulatory Visit (INDEPENDENT_AMBULATORY_CARE_PROVIDER_SITE_OTHER): Payer: Medicare HMO | Admitting: Family Medicine

## 2020-12-17 ENCOUNTER — Other Ambulatory Visit: Payer: Self-pay

## 2020-12-17 ENCOUNTER — Other Ambulatory Visit: Payer: Self-pay | Admitting: Family Medicine

## 2020-12-17 VITALS — BP 110/60 | HR 95 | Ht 65.0 in | Wt 111.4 lb

## 2020-12-17 DIAGNOSIS — R519 Headache, unspecified: Secondary | ICD-10-CM | POA: Diagnosis not present

## 2020-12-17 DIAGNOSIS — E118 Type 2 diabetes mellitus with unspecified complications: Secondary | ICD-10-CM | POA: Diagnosis not present

## 2020-12-17 DIAGNOSIS — Z8639 Personal history of other endocrine, nutritional and metabolic disease: Secondary | ICD-10-CM

## 2020-12-17 DIAGNOSIS — K219 Gastro-esophageal reflux disease without esophagitis: Secondary | ICD-10-CM

## 2020-12-17 DIAGNOSIS — I10 Essential (primary) hypertension: Secondary | ICD-10-CM | POA: Diagnosis not present

## 2020-12-17 LAB — POCT GLYCOSYLATED HEMOGLOBIN (HGB A1C): Hemoglobin A1C: 6.7 % — AB (ref 4.0–5.6)

## 2020-12-17 MED ORDER — DAPAGLIFLOZIN PROPANEDIOL 5 MG PO TABS: 5.0000 mg | ORAL_TABLET | Freq: Every day | ORAL | 2 refills | Status: DC

## 2020-12-17 NOTE — Patient Instructions (Signed)
We have reviewed your medications and you can stop the spironolactone and the atorvastatin medications.    Dapagliflozin tablets What is this medicine? DAPAGLIFLOZIN (DAP a gli FLOE zin) controls blood sugar in people with diabetes. It is used with lifestyle changes like diet and exercise. It also treats heart failure and kidney disease. It may lower the risk for treatment of heart failure in the hospital or worsened kidney disease. This medicine may be used for other purposes; ask your health care provider or pharmacist if you have questions. COMMON BRAND NAME(S): Marcelline Deist What should I tell my health care provider before I take this medicine? They need to know if you have any of these conditions:  dehydration  diabetic ketoacidosis  diet low in salt  eating less due to illness, surgery, dieting, or any other reason  having surgery  history of pancreatitis or pancreas problems  history of yeast infection of the penis or vagina  if you often drink alcohol  infection in the bladder, kidneys, or urinary tract  kidney disease  low blood pressure  on dialysis  problems urinating  type 1 diabetes  uncircumcised female  an unusual or allergic reaction to dapagliflozin, other medicines, foods, dyes, or preservatives  pregnant or trying to get pregnant  breast-feeding How should I use this medicine? Take this medicine by mouth with water. Take it as directed on the prescription label at the same time every day. You can take it with or without food. If it upsets your stomach, take it with food. Keep taking it unless your health care provider tells you to stop. A special MedGuide will be given to you by the pharmacist with each prescription and refill. Be sure to read this information carefully each time. Talk to your health care provider about the use of this medicine in children. Special care may be needed. Overdosage: If you think you have taken too much of this medicine  contact a poison control center or emergency room at once. NOTE: This medicine is only for you. Do not share this medicine with others. What if I miss a dose? If you miss a dose, take it as soon as you can. If it is almost time for your next dose, take only that dose. Do not take double or extra doses. What may interact with this medicine? Interactions are not expected. This list may not describe all possible interactions. Give your health care provider a list of all the medicines, herbs, non-prescription drugs, or dietary supplements you use. Also tell them if you smoke, drink alcohol, or use illegal drugs. Some items may interact with your medicine. What should I watch for while using this medicine? Visit your health care provider for regular checks on your progress. Tell your health care provider if your symptoms do not start to get better or if they get worse. This medicine can cause a serious condition in which there is too much acid in the blood. If you develop nausea, vomiting, stomach pain, unusual tiredness, or breathing problems, stop taking this medicine and call your doctor right away. If possible, use a ketone dipstick to check for ketones in your urine. Check with your health care provider if you have severe diarrhea, nausea, and vomiting, or if you sweat a lot. The loss of too much body fluid may make it dangerous for you to take this medicine. A test called the HbA1C (A1C) will be monitored. This is a simple blood test. It measures your blood sugar control  over the last 2 to 3 months. You will receive this test every 3 to 6 months. Learn how to check your blood sugar. Learn the symptoms of low and high blood sugar and how to manage them. Always carry a quick-source of sugar with you in case you have symptoms of low blood sugar. Examples include hard sugar candy or glucose tablets. Make sure others know that you can choke if you eat or drink when you develop serious symptoms of low blood  sugar, such as seizures or unconsciousness. Get medical help at once. Tell your health care provider if you have high blood sugar. You might need to change the dose of your medicine. If you are sick or exercising more than usual, you may need to change the dose of your medicine. Do not skip meals. Ask your health care provider if you should avoid alcohol. Many nonprescription cough and cold products contain sugar or alcohol. These can affect blood sugar. Wear a medical ID bracelet or chain. Carry a card that describes your condition. List the medicines and doses you take on the card. What side effects may I notice from receiving this medicine? Side effects that you should report to your doctor or health care professional as soon as possible:  allergic reactions (skin rash, itching or hives, swelling of the face, lips, or tongue)  breathing problems  dizziness  feeling faint or lightheaded, falls  genital infection (fever; tenderness, redness, or swelling in the genitals or area from the genitals to the back of the rectum)  kidney injury (trouble passing urine or change in the amount of urine)  low blood sugar (feeling anxious; confusion; dizziness; increased hunger; unusually weak or tired; increased sweating; shakiness; cold, clammy skin; irritable; headache; blurred vision; fast heartbeat; loss of consciousness)  muscle weakness  nausea, vomiting, unusual stomach upset or pain  new pain or tenderness, change in skin color, sores or ulcers, or infection in legs or feet  penile discharge, itching, or pain  unusual tiredness  unusual vaginal discharge, itching, or odor  urinary tract infection (fever; chills; a burning feeling when urinating; urgent need to urinate more often; blood in the urine; back pain) Side effects that usually do not require medical attention (report to your doctor or health care professional if they continue or are bothersome):  mild increase in  urination  thirsty This list may not describe all possible side effects. Call your doctor for medical advice about side effects. You may report side effects to FDA at 1-800-FDA-1088. Where should I keep my medicine? Keep out of the reach of children and pets. Store at room temperature between 20 and 25 degrees C (68 and 77 degrees F). Get rid of any unused medicine after the expiration date. To get rid of medicines that are no longer needed or have expired:  Take the medicine to a medicine take-back program. Check with your pharmacy or law enforcement to find a location.  If you cannot return the medicine, check the label or package insert to see if the medicine should be thrown out in the garbage or flushed down the toilet. If you are not sure, ask your health care provider. If it is safe to put it in the trash, take the medicine out of the container. Mix the medicine with cat litter, dirt, coffee grounds, or other unwanted substance. Seal the mixture in a bag or container. Put it in the trash. NOTE: This sheet is a summary. It may not cover all possible  information. If you have questions about this medicine, talk to your doctor, pharmacist, or health care provider.  2021 Elsevier/Gold Standard (2020-05-02 13:18:47)

## 2020-12-17 NOTE — Assessment & Plan Note (Signed)
Continue famotidine, symptoms well controlled

## 2020-12-17 NOTE — Assessment & Plan Note (Signed)
BP within normal range today.  Continue Amlodipine and HCTZ Removed spironolactone from medication list

## 2020-12-17 NOTE — Assessment & Plan Note (Signed)
hgb A1c repeat today, elevated greater than 6.5 today, given CKD will start SGLT2 inhibitor Follow up in 2 weeks to see how patient is tolerating medication Plan to check BMP at that time

## 2020-12-17 NOTE — Progress Notes (Signed)
° ° °  SUBJECTIVE:   CHIEF COMPLAINT / HPI: medication management, HA  Giamarie Bueche is a 70 year old female with hx of bilateral LE cramps, HTN and Stage 3 CKD presenting for medication management. Patient reports since the middle of last year she has been experiencing brief headaches 2-3 times per week. She denies any numbness, dizziness, weakness, vision changes. The headaches usually subside without medication but they go away quickly after taking Tylenol. Patient expresses that her main priority today is to review her current medications. She reports receiving refills for medications and wants to clarify which medications she should take closer to bedtime. Patient brought medications with her to today's appt.   Given her hx of DM and CKD, patient was offered SGLT2 inhibitor as renal protective medication. She is agreeable to starting this.   PERTINENT  PMH / PSH:  HTN  GERD  Diverticulosis  Stage 3 CKD  OBJECTIVE:   BP 110/60    Pulse 95    Ht 5\' 5"  (1.651 m)    Wt 111 lb 6 oz (50.5 kg)    SpO2 98%    BMI 18.53 kg/m   General: female appearing stated age in no acute distress HEENT:Neck non-tender without lymphadenopathy, masses or thyromegaly Cardio: Normal S1 and S2, no S3 or S4. Rhythm is regular.  Pulm: Clear to auscultation bilaterally, no crackles, wheezing, or diminished breath sounds. Normal respiratory effort Abdomen: Bowel sounds normal. Abdomen soft and non-tender.  Extremities: No peripheral edema. Warm/ well perfused.  Neuro: pt alert and oriented x4. 5/5 strength in bilateral upper and lower extremities   ASSESSMENT/PLAN:   HYPERTENSION, BENIGN SYSTEMIC BP within normal range today.  Continue Amlodipine and HCTZ Removed spironolactone from medication list   GERD (gastroesophageal reflux disease) Continue famotidine, symptoms well controlled   Controlled diabetes mellitus type 2 with complications (HCC) hgb A1c repeat today, elevated greater than 6.5 today,  given CKD will start SGLT2 inhibitor Follow up in 2 weeks to see how patient is tolerating medication Plan to check BMP at that time   Headache Headache features do not appear to have red flags such as weakness, gait disturbance, blurry vision, persistent despite conservative measures or increased sleeping.  Patient does not appear to be in any distress today.  Would consider tension headache as potential diagnoses.  Patient has no report of Aro or nausea and does not describe throbbing associated with a classic migraine type of headache. Patient does have some medications that could be associated with headache.  We will plan to review these medications with the patient if this remains an issue or frequency increases from 2 to 3 days/week.     , MD [facility]

## 2020-12-19 NOTE — Assessment & Plan Note (Signed)
Headache features do not appear to have red flags such as weakness, gait disturbance, blurry vision, persistent despite conservative measures or increased sleeping.  Patient does not appear to be in any distress today.  Would consider tension headache as potential diagnoses.  Patient has no report of Aro or nausea and does not describe throbbing associated with a classic migraine type of headache. Patient does have some medications that could be associated with headache.  We will plan to review these medications with the patient if this remains an issue or frequency increases from 2 to 3 days/week.

## 2021-02-08 ENCOUNTER — Other Ambulatory Visit: Payer: Self-pay | Admitting: Family Medicine

## 2021-02-20 ENCOUNTER — Ambulatory Visit: Payer: Medicare HMO

## 2021-03-10 NOTE — Progress Notes (Signed)
    SUBJECTIVE:   CHIEF COMPLAINT / HPI: HA and nocturnal leg cramps   HA Patient reports having several light headaches that usually occur 2-3 AM and these wake her up from her sleep. Denies that they make her feel nauseous. HA are relieved after taking Tylenol. Often will occur early in the morning. She denies any vision changes during the HA. She reports having HA before and had head imaging. She denies any confusion,denies syncope, denies dizziness or difficulty speaking. She reports sitting up and taking a tylenol and headache will resolve within a few minutes. Occurs on left facial. This is the same when she had imaging that was notable for cholesterol granuloma in 2018 but had totally resolved until recently.   DM She states that she felt lightheaded when taking metformin before. She reports the farxiga was too expensive so she did not start taking it.   Nocturnal Leg cramps She is also having leg cramps also interrupt her sleep and have been occurring 3 weeks-1 month.    PERTINENT  PMH / PSH:  Nocturnal Leg cramps  DM, diet controlled  Headaches   OBJECTIVE:   BP (!) 152/80   Pulse 85   Wt 114 lb 3.2 oz (51.8 kg)   SpO2 99%   BMI 19.00 kg/m   General: female appearing stated age in no acute distress HEENT: MMM, no oral lesions noted,Neck non-tender without lymphadenopathy, masses or thyromegaly Cardio: Normal S1 and S2, no S3 or S4. Rhythm is regular. No murmurs or rubs.  Bilateral radial pulses palpable Pulm: Clear to auscultation bilaterally, no crackles, wheezing, or diminished breath sounds. Normal respiratory effort, stable on RA Abdomen: Bowel sounds normal. Abdomen soft and non-tender.  Extremities: No peripheral edema. Warm/ well perfused.  Neuro: pt alert and oriented x4, sensation intact, EOMI bilaterally, PERRLA    ASSESSMENT/PLAN:   HYPERTENSION, BENIGN SYSTEMIC Repeat blood pressure 153/80. Denies HA at this time.  Will schedule for amb blood pressure  monitoring with Dr. Raymondo Band  Will continue amlodipine 10mg  daily and HCTZ 25mg     Headache Patient with increased frequency and occurrence of prior headache.  History of cholesterol granuloma in 2018. MRI brain ordered -We will follow up once results available  Cramp of both lower extremities Patient continues to have bilateral lower extremity cramps nocturnal.  Patient on low-dose of Lipitor for history of hyperlipidemia.  She reports cramps are improved from before but have started to occur more frequently.  Patient also on Requip as symptoms similar to restless leg syndrome. -Increase Requip dose -Metabolic panel -trial of 5mg  instead of 20mg  of statin      , MD [facility]

## 2021-03-11 ENCOUNTER — Encounter: Payer: Self-pay | Admitting: Family Medicine

## 2021-03-11 ENCOUNTER — Ambulatory Visit (INDEPENDENT_AMBULATORY_CARE_PROVIDER_SITE_OTHER): Payer: Medicare HMO | Admitting: Family Medicine

## 2021-03-11 ENCOUNTER — Other Ambulatory Visit: Payer: Self-pay

## 2021-03-11 VITALS — BP 152/80 | HR 85 | Wt 114.2 lb

## 2021-03-11 DIAGNOSIS — G4762 Sleep related leg cramps: Secondary | ICD-10-CM | POA: Diagnosis not present

## 2021-03-11 DIAGNOSIS — I10 Essential (primary) hypertension: Secondary | ICD-10-CM | POA: Diagnosis not present

## 2021-03-11 DIAGNOSIS — R519 Headache, unspecified: Secondary | ICD-10-CM

## 2021-03-11 DIAGNOSIS — R030 Elevated blood-pressure reading, without diagnosis of hypertension: Secondary | ICD-10-CM | POA: Diagnosis not present

## 2021-03-11 DIAGNOSIS — E785 Hyperlipidemia, unspecified: Secondary | ICD-10-CM | POA: Diagnosis not present

## 2021-03-11 DIAGNOSIS — R252 Cramp and spasm: Secondary | ICD-10-CM | POA: Diagnosis not present

## 2021-03-11 NOTE — Patient Instructions (Signed)
For your hypertension I have scheduled you with our pharmacist to record your blood pressure over 24 hours. I have scheduled you for an appt with Dr. Raymondo Band on 03/21/21 at 9:00 AM.   For your headache, I want to repeat imaging to see if there have been any changes to the findings on your MRI from 2018.  We will schedule you for an MRI at [facility]. We will call once we are able to get this scheduled.    Please follow-up with me in 2 weeks for blood pressure follow up.   For now, continue your amlodipine and hydrochlorothiazide daily.  I have stopped the diabetes medication as your hemoglobin A1c is within well-controlled range.

## 2021-03-11 NOTE — Assessment & Plan Note (Signed)
Patient with increased frequency and occurrence of prior headache.  History of cholesterol granuloma in 2018. MRI brain ordered -We will follow up once results available

## 2021-03-11 NOTE — Assessment & Plan Note (Addendum)
Repeat blood pressure 153/80. Denies HA at this time.  Will schedule for amb blood pressure monitoring with Dr. Raymondo Band  Will continue amlodipine 10mg  daily and HCTZ 25mg   CMP

## 2021-03-11 NOTE — Assessment & Plan Note (Addendum)
Patient continues to have bilateral lower extremity cramps nocturnal.  Patient on low-dose of Lipitor for history of hyperlipidemia.  She reports cramps are improved from before but have started to occur more frequently.  Patient also on Requip as symptoms similar to restless leg syndrome. -Increase Requip dose -complete Metabolic panel -trial of 5mg  instead of 20mg  of statin

## 2021-03-12 ENCOUNTER — Telehealth: Payer: Self-pay

## 2021-03-12 LAB — COMPREHENSIVE METABOLIC PANEL
ALT: 25 IU/L (ref 0–32)
AST: 32 IU/L (ref 0–40)
Albumin/Globulin Ratio: 1.6 (ref 1.2–2.2)
Albumin: 4.7 g/dL (ref 3.8–4.8)
Alkaline Phosphatase: 151 IU/L — ABNORMAL HIGH (ref 44–121)
BUN/Creatinine Ratio: 14 (ref 12–28)
BUN: 13 mg/dL (ref 8–27)
Bilirubin Total: 0.8 mg/dL (ref 0.0–1.2)
CO2: 24 mmol/L (ref 20–29)
Calcium: 9.7 mg/dL (ref 8.7–10.3)
Chloride: 95 mmol/L — ABNORMAL LOW (ref 96–106)
Creatinine, Ser: 0.9 mg/dL (ref 0.57–1.00)
Globulin, Total: 2.9 g/dL (ref 1.5–4.5)
Glucose: 128 mg/dL — ABNORMAL HIGH (ref 65–99)
Potassium: 3.2 mmol/L — ABNORMAL LOW (ref 3.5–5.2)
Sodium: 136 mmol/L (ref 134–144)
Total Protein: 7.6 g/dL (ref 6.0–8.5)
eGFR: 69 mL/min/{1.73_m2} (ref >59)

## 2021-03-12 LAB — LIPID PANEL
Chol/HDL Ratio: 2.5 ratio (ref 0.0–4.4)
Cholesterol, Total: 157 mg/dL (ref 100–199)
HDL: 63 mg/dL (ref >39)
LDL Chol Calc (NIH): 44 mg/dL (ref 0–99)
Triglycerides: 337 mg/dL — ABNORMAL HIGH (ref 0–149)
VLDL Cholesterol Cal: 50 mg/dL — ABNORMAL HIGH (ref 5–40)

## 2021-03-12 LAB — FERRITIN: Ferritin: 137 ng/mL (ref 15–150)

## 2021-03-12 NOTE — Telephone Encounter (Signed)
Called patient to inform her of MRI.  03/16/2021 [name]  1500 with arrival @1430  Enter through emergency room Outpatient MRI   Patient states that she is not able to keep this appointment.  Patient was given scheduling number.   [phone] to reschedule at her convenience.  [phone], CMA

## 2021-03-13 ENCOUNTER — Other Ambulatory Visit: Payer: Self-pay | Admitting: Family Medicine

## 2021-03-13 MED ORDER — FENOFIBRATE 48 MG PO TABS: 48.0000 mg | ORAL_TABLET | Freq: Every day | ORAL | 1 refills | Status: DC

## 2021-03-13 MED ORDER — POTASSIUM CHLORIDE ER 20 MEQ PO TBCR: 20.0000 meq | EXTENDED_RELEASE_TABLET | Freq: Every day | ORAL | 0 refills | Status: DC

## 2021-03-14 MED ORDER — ROPINIROLE HCL 0.5 MG PO TABS: ORAL_TABLET | ORAL | 0 refills | Status: DC

## 2021-03-16 ENCOUNTER — Ambulatory Visit (HOSPITAL_COMMUNITY): Payer: Medicare HMO

## 2021-03-20 ENCOUNTER — Other Ambulatory Visit: Payer: Self-pay | Admitting: Family Medicine

## 2021-03-20 ENCOUNTER — Telehealth: Payer: Self-pay

## 2021-03-20 MED ORDER — LORAZEPAM 1 MG PO TABS: 1.0000 mg | ORAL_TABLET | Freq: Once | ORAL | 0 refills | Status: AC

## 2021-03-20 NOTE — Telephone Encounter (Signed)
One time dose sent to patient's pharmacy with fill date requested for 03/22/21. 1 mg Ativan prescribed prior to MRI.   Ronnald Ramp, MD [facility], PGY-2 [phone]

## 2021-03-20 NOTE — Telephone Encounter (Signed)
Patient calls nurse line stating she has an MRI scheduled for 4/21 and was told she would get something for her nerves. Patient is requesting this to be sent to [facility] on [address]. Please advise.

## 2021-03-21 ENCOUNTER — Ambulatory Visit (INDEPENDENT_AMBULATORY_CARE_PROVIDER_SITE_OTHER): Payer: Medicare HMO | Admitting: Pharmacist

## 2021-03-21 ENCOUNTER — Other Ambulatory Visit: Payer: Self-pay

## 2021-03-21 VITALS — HR 98

## 2021-03-21 DIAGNOSIS — Z8639 Personal history of other endocrine, nutritional and metabolic disease: Secondary | ICD-10-CM

## 2021-03-21 DIAGNOSIS — E118 Type 2 diabetes mellitus with unspecified complications: Secondary | ICD-10-CM | POA: Diagnosis not present

## 2021-03-21 DIAGNOSIS — I10 Essential (primary) hypertension: Secondary | ICD-10-CM

## 2021-03-21 NOTE — Patient Instructions (Addendum)
Blood Pressure Activity Diary Time Lying down/ Sleeping Walking/ Exercise Stressed/ Angry Headache/ Pain Dizzy  9 AM       10 AM       11 AM       12 PM       1 PM       2 PM       Time Lying down/ Sleeping Walking/ Exercise Stressed/ Angry Headache/ Pain Dizzy  3 PM       4 PM        5 PM       6 PM       7 PM       8 PM       Time Lying down/ Sleeping Walking/ Exercise Stressed/ Angry Headache/ Pain Dizzy  9 PM       10 PM       11 PM       12 AM       1 AM       2 AM       3 AM       Time Lying down/ Sleeping Walking/ Exercise Stressed/ Angry Headache/ Pain Dizzy  4 AM       5 AM       6 AM       7 AM       8 AM       9 AM       10 AM        Time you woke up: _________                  Time you went to sleep:__________   Come back next week either Monday or Tuesday to drop off the monitor. Will give you a call on Wednesday.   Call the [facility] if you have any questions before then [phone])  Wearing the Blood Pressure Monitor  The cuff will inflate every 20 minutes during the day and every 30 minutes while you sleep.  Your blood pressure readings will NOT display after cuff inflation  Fill out the blood pressure-activity diary during the day, especially during activities that may affect your reading -- such as exercise, stress, walking, taking your blood pressure medications  Important things to know:  Avoid taking the monitor off for the next 24 hours, unless it causes you discomfort or pain.  Do NOT get the monitor wet and do NOT dry to clean the monitor with any cleaning products.  Do NOT put the monitor on anyone else's arm.  When the cuff inflates, avoid excess movement. Let the cuffed arm hang loosely, slightly away from the body. Avoid flexing the muscles or moving the hand/fingers.  When you go to sleep, make sure that the hose is not kinked.  Remember to fill out the blood pressure activity diary.  If you experience severe  pain or unusual pain (not associated with getting your blood pressure checked), remove the monitor.  Troubleshooting:  Code  Troubleshooting   1  Check cuff position, tighten cuff   2, 3  Remain still during reading   4, 87  Check air hose connections and make sure cuff is tight   85, 89  Check hose connections and make tubing is not crimped   86  Push START/STOP to restart reading   88, 91  Retry by pushing START/STOP   90  Replace batteries. If problem persists, remove monitor and bring back to  clinic at follow up   97, 98, 99  Service required - Remove monitor and bring back to clinic at follow up

## 2021-03-21 NOTE — Progress Notes (Signed)
S:     Patient arrives in good spirits. Presents to the clinic for ambulatory blood pressure evaluation.   Patient was referred and last seen by Primary Care Provider, Dr. Neita Garnet on 03/11/2021.    Diagnosed with Hypertension "at least twenty years ago"  Medication compliance is reported to be good  Discussed procedure for wearing the monitor and gave patient written instructions. Monitor was placed on non-dominant arm with instructions to return in the morning.   Current BP Medications include: amlodipine 10 mg daily, hydrochlorothiazide 25 mg daily  Antihypertensives tried in the past include: lisinopril (angioedema without airway obstruction)   O:   Physical Exam Constitutional:      Appearance: Normal appearance. She is normal weight.  Cardiovascular:     Pulses: Normal pulses.  Pulmonary:     Effort: Pulmonary effort is normal.  Musculoskeletal:     Right lower leg: No edema.     Left lower leg: No edema.  Neurological:     Mental Status: She is alert.  Psychiatric:        Mood and Affect: Mood normal.        Behavior: Behavior normal.        Thought Content: Thought content normal.      Review of Systems  Neurological: Positive for headaches.    Last 3 Office BP readings: BP Readings from Last 3 Encounters:  03/11/21 (!) 152/80  12/17/20 110/60  11/08/20 (!) 146/72    Clinical Atherosclerotic Cardiovascular Disease (ASCVD): No  The 10-year ASCVD risk score Denman George DC Jr., et al., 2013) is: 28%   Values used to calculate the score:     Age: 70 years     Sex: Female     Is Non-Hispanic African American: Yes     Diabetic: Yes     Tobacco smoker: No     Systolic Blood Pressure: 152 mmHg     Is BP treated: Yes     HDL Cholesterol: 63 mg/dL     Total Cholesterol: 157 mg/dL  Basic Metabolic Panel    Component Value Date/Time   NA 136 03/11/2021 1504   K 3.2 (L) 03/11/2021 1504   CL 95 (L) 03/11/2021 1504   CO2 24 03/11/2021 1504   GLUCOSE  128 (H) 03/11/2021 1504   GLUCOSE 186 (H) 09/02/2018 1027   BUN 13 03/11/2021 1504   CREATININE 0.90 03/11/2021 1504   CREATININE 1.10 (H) 01/21/2017 1442   CALCIUM 9.7 03/11/2021 1504   GFRNONAA 54 (L) 11/08/2020 1440   GFRNONAA 53 (L) 01/21/2017 1442   GFRAA 63 11/08/2020 1440   GFRAA 61 01/21/2017 1442    Renal function: Estimated Creatinine Clearance: 48.2 mL/min (by C-G formula based on SCr of 0.9 mg/dL).   Amb BP monitor placed.    A/P: History of hypertension for at least 20 years.  Reports adherence to amlodipine and HCTZ.  Amb BP monitor placed - assessment planned following device return next week.   History of diet controlled diabetes.  Currently taking no medications for blood sugar control.  Not checking blood glucose. Triglyceride elevation at recent lipid assessment may signify elevated blood sugar, necessitating medication.  Obtained A1C to assess.   Results reviewed and written information provided.  Total time in face-to-face counseling 20 minutes.   Patient will return amb BP monitor early next week and results/plan will be communicated by phone.  Patient seen with Coralyn Helling, PharmD Candidate, and Isaias Sakai, PharmD - PGY-1 Resident.

## 2021-03-21 NOTE — Assessment & Plan Note (Signed)
History of hypertension for at least 20 years.  Reports adherence to amlodipine and HCTZ.  Amb BP monitor placed - assessment planned following device return next week.

## 2021-03-21 NOTE — Assessment & Plan Note (Signed)
History of diet controlled diabetes.  Currently taking no medications for blood sugar control.  Not checking blood glucose. Triglyceride elevation at recent lipid assessment may signify elevated blood sugar, necessitating medication.  Obtained A1C to assess.

## 2021-03-22 LAB — HEMOGLOBIN A1C
Est. average glucose Bld gHb Est-mCnc: 134 mg/dL
Hgb A1c MFr Bld: 6.3 % — ABNORMAL HIGH (ref 4.8–5.6)

## 2021-03-25 NOTE — Progress Notes (Signed)
Reviewed: Agree with Dr. Koval's documentation and management. 

## 2021-03-27 NOTE — Progress Notes (Signed)
Reviewed: I agree with Dr. Koval's documentation and management. 

## 2021-03-27 NOTE — Progress Notes (Addendum)
Phone Call to patient to share results of the Amb BP monitor evaluation completed last week.  Delayed assessment and patient contact due to provider vacation (out of office).   Patient reports that the monitor squeezed her arm very hard and was unable to tolerate by 3:00 AM, so she took it off.  She denies any episodes of orthostatis or light-headedness.  ABPM Study Data: Arm Placement left arm  I shared the results of the report with the patient:   Overall Mean 24hr BP:   147/77 mmHg HR: 88   Daytime Mean BP:  151/80 mmHg HR: 91 (note: these readings include 1.5 hour nap - 1:00-2:30 PM)   Nighttime Mean BP:  128/63 mmHg HR: 76   Dipping Pattern: Yes.    Sys:   15.3%   Dia: 20.6%   [normal dipping ~10-20%]  We discussed the potential of an additional blood pressure agent.   As patient has a PCP appointment in 6 days, we agreed to defer this decision until that appointment.   As patient is taking HCTZ 75m and amlodipine 167mdaily,  Two therapies for consideration at that time: Aldosterone modulator (Eplerenone 2541maily) or Beta-blocker (Carvedilol 3.125m52mD)  Patient comfortable with additional discussion next week.  Following conversation, patient verbalized understanding of results and treatment plan that could include adding an additional BP medication next week.    Repeat A1C = 6.3 Consideration for SGLT2 could be considered given diabetes and history of EGFR - ~ 60 / CKD Dx Discussed with PCP.

## 2021-03-28 ENCOUNTER — Ambulatory Visit (HOSPITAL_COMMUNITY)
Admission: RE | Admit: 2021-03-28 | Discharge: 2021-03-28 | Disposition: A | Discharge status: Home / Self Care | Payer: Medicare HMO | Source: Ambulatory Visit | Attending: Family Medicine | Admitting: Family Medicine

## 2021-03-28 ENCOUNTER — Other Ambulatory Visit: Payer: Self-pay

## 2021-03-28 DIAGNOSIS — R519 Headache, unspecified: Secondary | ICD-10-CM | POA: Diagnosis not present

## 2021-03-28 MED ORDER — GADOBUTROL 1 MMOL/ML IV SOLN
5.0000 mL | Freq: Once | INTRAVENOUS | Status: AC | PRN
  Administered 2021-03-28: 5 mL via INTRAVENOUS

## 2021-04-01 NOTE — Progress Notes (Signed)
    SUBJECTIVE:   CHIEF COMPLAINT / HPI: blood pressure follow up    HTN  Patient underwent ambulatory blood pressure monitoring with Dr. Raymondo Band. Considering adding either eplerone or coreg 3.125. Discussed starting new medication.  Patient is agreeable to this. She denies current HA or vision changes although has had intermittent "mild" HA as discussed below. She reports adherence to regimen including diuretic and amlodipine.   MRI findings  Patient sent for MRI given changes to HA pattern with hx of cholesterol granuloma. There has been some enlargement and mass effect involving the left pons and trgeminal nerve that could be assoicated with patient's HA symptoms. Patient is requesting different medication to help with manageing symptoms. She reports still having intermittent light headaches.  She is able to take Tylenol to help with her symptoms.  Patient reports that headaches are not debilitating but are bothersome.  She has not had any weakness, vision disturbances related to her headaches.  Hypokalemia, in setting of diuretic use  Patient was found to have K of 3.2 and prescribed K 20 mEq daily. She reports tolerating these supplements. She also reports attempting to increase potassium rich foods in her diet.   PERTINENT  PMH / PSH:  Headaches Hypertension  OBJECTIVE:   BP (!) 142/72   Pulse (!) 102   Wt 111 lb 6.4 oz (50.5 kg)   SpO2 99%   BMI 18.54 kg/m   General: female appearing stated age in no acute distress HEENT: MMM, no oral lesions noted,Neck non-tender without lymphadenopathy, masses or thyromegaly Cardio: Normal S1 and S2, no S3 or S4. Rhythm is regular. No murmurs or rubs.  Bilateral radial pulses palpable Pulm: Clear to auscultation bilaterally, no crackles, wheezing, or diminished breath sounds. Normal respiratory effort, stable on room air Abdomen: Bowel sounds normal. Abdomen soft and non-tender. Extremities: No peripheral edema. Warm/ well perfused. Neuro: pt  alert and oriented x4, sensation intact in all extremities   ASSESSMENT/PLAN:   Hypokalemia Potassium on 03/11/2021 low at 3.2. Patient started on 20 mEq of potassium. 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 Will refer to neurology for further management evaluation Patient to continue Tylenol as needed for headaches   HYPERTENSION, BENIGN SYSTEMIC Start Coreg 3.125 twice daily  Repeat BMP today  Follow up in 2 weeks   Ronnald Ramp, MD [facility]

## 2021-04-02 ENCOUNTER — Other Ambulatory Visit: Payer: Self-pay

## 2021-04-02 ENCOUNTER — Encounter: Payer: Self-pay | Admitting: Family Medicine

## 2021-04-02 ENCOUNTER — Ambulatory Visit (INDEPENDENT_AMBULATORY_CARE_PROVIDER_SITE_OTHER): Payer: Medicare HMO | Admitting: Family Medicine

## 2021-04-02 VITALS — BP 142/72 | HR 102 | Wt 111.4 lb

## 2021-04-02 DIAGNOSIS — H7190 Unspecified cholesteatoma, unspecified ear: Secondary | ICD-10-CM | POA: Insufficient documentation

## 2021-04-02 DIAGNOSIS — I10 Essential (primary) hypertension: Secondary | ICD-10-CM | POA: Diagnosis not present

## 2021-04-02 DIAGNOSIS — E876 Hypokalemia: Secondary | ICD-10-CM | POA: Insufficient documentation

## 2021-04-02 MED ORDER — CARVEDILOL 3.125 MG PO TABS: 3.1250 mg | ORAL_TABLET | Freq: Two times a day (BID) | ORAL | 0 refills | Status: DC

## 2021-04-02 NOTE — Assessment & Plan Note (Signed)
Potassium on 03/11/2021 low at 3.2. Patient started on 20 mEq of potassium. We will recheck potassium with BMP today

## 2021-04-02 NOTE — Assessment & Plan Note (Signed)
Patient recently underwent MRI brain without contrast.  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 Will refer to neurology for further management evaluation Patient to continue Tylenol as needed for headaches

## 2021-04-02 NOTE — Patient Instructions (Addendum)
Thank you for visiting the family medicine center today.  Today we will start a medication called carvedilol to help with your blood pressure.  Please notify our office if you have any dizziness or lightheadedness while taking this medication.  I recommend that you follow-up with me in 2 weeks to see how your blood pressure is tolerating the new medication. Please continue to take your other blood pressure medications with this.  For your MRI findings, am referring you to neurosurgery for further management.   There has been some enlargement of your cholesterol granuloma that was found on your MRI from years ago.  The enlargement could be irritating the nerves and causing your headaches.

## 2021-04-02 NOTE — Assessment & Plan Note (Signed)
Start Coreg 3.125 twice daily  Repeat BMP today  Follow up in 2 weeks

## 2021-04-03 LAB — BASIC METABOLIC PANEL
BUN/Creatinine Ratio: 12 (ref 12–28)
BUN: 14 mg/dL (ref 8–27)
CO2: 28 mmol/L (ref 20–29)
Calcium: 10 mg/dL (ref 8.7–10.3)
Chloride: 95 mmol/L — ABNORMAL LOW (ref 96–106)
Creatinine, Ser: 1.16 mg/dL — ABNORMAL HIGH (ref 0.57–1.00)
Glucose: 129 mg/dL — ABNORMAL HIGH (ref 65–99)
Potassium: 3.8 mmol/L (ref 3.5–5.2)
Sodium: 138 mmol/L (ref 134–144)
eGFR: 51 mL/min/{1.73_m2} — ABNORMAL LOW (ref >59)

## 2021-04-05 ENCOUNTER — Other Ambulatory Visit: Payer: Self-pay | Admitting: Family Medicine

## 2021-04-11 DIAGNOSIS — H748X2 Other specified disorders of left middle ear and mastoid: Secondary | ICD-10-CM | POA: Diagnosis not present

## 2021-04-17 ENCOUNTER — Other Ambulatory Visit: Payer: Self-pay | Admitting: Family Medicine

## 2021-04-17 DIAGNOSIS — E785 Hyperlipidemia, unspecified: Secondary | ICD-10-CM | POA: Diagnosis not present

## 2021-04-17 DIAGNOSIS — E1122 Type 2 diabetes mellitus with diabetic chronic kidney disease: Secondary | ICD-10-CM | POA: Diagnosis not present

## 2021-04-17 DIAGNOSIS — K219 Gastro-esophageal reflux disease without esophagitis: Secondary | ICD-10-CM | POA: Diagnosis not present

## 2021-04-17 DIAGNOSIS — G8929 Other chronic pain: Secondary | ICD-10-CM | POA: Diagnosis not present

## 2021-04-17 DIAGNOSIS — Z7982 Long term (current) use of aspirin: Secondary | ICD-10-CM | POA: Diagnosis not present

## 2021-04-17 DIAGNOSIS — I129 Hypertensive chronic kidney disease with stage 1 through stage 4 chronic kidney disease, or unspecified chronic kidney disease: Secondary | ICD-10-CM | POA: Diagnosis not present

## 2021-04-17 DIAGNOSIS — M1990 Unspecified osteoarthritis, unspecified site: Secondary | ICD-10-CM | POA: Diagnosis not present

## 2021-04-17 DIAGNOSIS — Z008 Encounter for other general examination: Secondary | ICD-10-CM | POA: Diagnosis not present

## 2021-04-17 DIAGNOSIS — K5900 Constipation, unspecified: Secondary | ICD-10-CM | POA: Diagnosis not present

## 2021-04-17 DIAGNOSIS — E1159 Type 2 diabetes mellitus with other circulatory complications: Secondary | ICD-10-CM | POA: Diagnosis not present

## 2021-04-17 DIAGNOSIS — N189 Chronic kidney disease, unspecified: Secondary | ICD-10-CM | POA: Diagnosis not present

## 2021-05-10 DIAGNOSIS — E119 Type 2 diabetes mellitus without complications: Secondary | ICD-10-CM | POA: Diagnosis not present

## 2021-05-10 DIAGNOSIS — H2513 Age-related nuclear cataract, bilateral: Secondary | ICD-10-CM | POA: Diagnosis not present

## 2021-05-10 DIAGNOSIS — H18513 Endothelial corneal dystrophy, bilateral: Secondary | ICD-10-CM | POA: Diagnosis not present

## 2021-05-10 DIAGNOSIS — H40023 Open angle with borderline findings, high risk, bilateral: Secondary | ICD-10-CM | POA: Diagnosis not present

## 2021-05-10 DIAGNOSIS — H35033 Hypertensive retinopathy, bilateral: Secondary | ICD-10-CM | POA: Diagnosis not present

## 2021-05-10 LAB — HM DIABETES EYE EXAM

## 2021-05-21 ENCOUNTER — Ambulatory Visit (INDEPENDENT_AMBULATORY_CARE_PROVIDER_SITE_OTHER): Payer: Medicare HMO | Admitting: Family Medicine

## 2021-05-21 ENCOUNTER — Encounter: Payer: Self-pay | Admitting: Family Medicine

## 2021-05-21 ENCOUNTER — Other Ambulatory Visit: Payer: Self-pay

## 2021-05-21 VITALS — BP 136/69 | HR 84 | Wt 105.8 lb

## 2021-05-21 DIAGNOSIS — R630 Anorexia: Secondary | ICD-10-CM

## 2021-05-21 DIAGNOSIS — I10 Essential (primary) hypertension: Secondary | ICD-10-CM

## 2021-05-21 DIAGNOSIS — E785 Hyperlipidemia, unspecified: Secondary | ICD-10-CM

## 2021-05-21 MED ORDER — MIRTAZAPINE 7.5 MG PO TABS
7.5000 mg | ORAL_TABLET | Freq: Every day | ORAL | 0 refills | Status: DC
Start: 2021-05-21 — End: 2021-06-13

## 2021-05-21 NOTE — Patient Instructions (Signed)
It was a pleasure to see you today!  Thank you for choosing [facility] for your primary care.   Pamela [name] was seen for feet swelling.   Our plans for today were: Please stop taking the Trazodone. You will start mirtazapine instead at bedtime to help with your appetite and sleep.  We will also be checking blood work for your triglycerides to see if the medication you have been taking has helped to lower your levels.   To keep you healthy, please keep in mind the following health maintenance items that you are due for:   Shingles Vaccine   Pneumonia Vaccine    You should return to our clinic in 2 weeks for appetite.   Best Wishes,   Dr. Neita Garnet

## 2021-05-21 NOTE — Progress Notes (Signed)
    SUBJECTIVE:   CHIEF COMPLAINT / HPI: decreased appetite    Malaise & Decreased Appetite Patient reports decreased appetite and feels like she has to push herself to eat. She states she has been drinking ensures to try and help increase her intake. This has been going on for a week. She reports eating [place] and noodles and wonders if the [place] made her sick. She reports vomiting after eating that particular meal. She reports other family members had the same meal and have felt normal. She states she is having a hard time sleeping at night. She does not have any increased stress. 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 Denies fevers, reports feeling cold in the house on Sunday after vomiting. Felt weak and decreased energy. She denies presence of abdominal pain. She denies having sick contacts. She denies chest pain or tightness. She reports having a cough for a few hours one night after emesis. She denies body aches. She has had three covid shots.    HLD  Current regimen includes rosuvastatin 5mg  daily and fenofibrate 48mg  She reports adherence to this regimen    PERTINENT  PMH / PSH:  HTN  GERD  HLD  Restless legs syndrome   OBJECTIVE:   BP 136/69   Pulse 84   Wt 105 lb 12.8 oz (48 kg)   SpO2 100%   BMI 17.61 kg/m   General: female appearing stated age in no acute distress HEENT: MMM, no oral lesions noted,Neck non-tender without lymphadenopathy, masses or thyromegaly Cardio: Normal S1 and S2, no S3 or S4. Rhythm is regular. No murmurs or rubs.  Bilateral radial pulses palpable Pulm: Clear to auscultation bilaterally, no crackles, wheezing, or diminished breath sounds. Normal respiratory effort, stable on RA Abdomen: Bowel sounds normal. Abdomen soft and non-tender.  Extremities: No peripheral edema. Warm/ well perfused.  Neuro: pt  alert and oriented x4, normal strength in upper and lower extremities    ASSESSMENT/PLAN:   Decreased appetite Patient reports decreased appetite, general malaise, denies sick contacts, persistent cough, fever, myalgias. Weight down 6 pounds in past 2 months. BMI 17.61.  - discontinued trazodone, trial of mirtazepine  - CBC, TSH, BMP   HLD (hyperlipidemia) Current regimen includes fenofibrate 48mg  and rosuvastatin 5mg  daily.  Direct TG level      , MD [facility]

## 2021-05-22 LAB — CBC WITH DIFFERENTIAL/PLATELET
Basophils Absolute: 0 10*3/uL (ref 0.0–0.2)
Basos: 1 %
EOS (ABSOLUTE): 0.1 10*3/uL (ref 0.0–0.4)
Eos: 1 %
Hematocrit: 34.7 % (ref 34.0–46.6)
Hemoglobin: 12.1 g/dL (ref 11.1–15.9)
Immature Grans (Abs): 0 10*3/uL (ref 0.0–0.1)
Immature Granulocytes: 1 %
Lymphocytes Absolute: 1.5 10*3/uL (ref 0.7–3.1)
Lymphs: 34 %
MCH: 33.9 pg — ABNORMAL HIGH (ref 26.6–33.0)
MCHC: 34.9 g/dL (ref 31.5–35.7)
MCV: 97 fL (ref 79–97)
Monocytes Absolute: 0.8 10*3/uL (ref 0.1–0.9)
Monocytes: 19 %
Neutrophils Absolute: 2 10*3/uL (ref 1.4–7.0)
Neutrophils: 44 %
Platelets: 158 10*3/uL (ref 150–450)
RBC: 3.57 x10E6/uL — ABNORMAL LOW (ref 3.77–5.28)
RDW: 12.4 % (ref 11.7–15.4)
WBC: 4.4 10*3/uL (ref 3.4–10.8)

## 2021-05-22 LAB — TRIGLYCERIDES: Triglycerides: 137 mg/dL (ref 0–149)

## 2021-05-22 LAB — TSH: TSH: 2.21 u[IU]/mL (ref 0.450–4.500)

## 2021-05-22 LAB — BASIC METABOLIC PANEL
BUN/Creatinine Ratio: 14 (ref 12–28)
BUN: 13 mg/dL (ref 8–27)
CO2: 26 mmol/L (ref 20–29)
Calcium: 9.7 mg/dL (ref 8.7–10.3)
Chloride: 89 mmol/L — ABNORMAL LOW (ref 96–106)
Creatinine, Ser: 0.94 mg/dL (ref 0.57–1.00)
Glucose: 152 mg/dL — ABNORMAL HIGH (ref 65–99)
Potassium: 3.3 mmol/L — ABNORMAL LOW (ref 3.5–5.2)
Sodium: 132 mmol/L — ABNORMAL LOW (ref 134–144)
eGFR: 66 mL/min/{1.73_m2} (ref >59)

## 2021-05-24 NOTE — Assessment & Plan Note (Signed)
Patient reports decreased appetite, general malaise, denies sick contacts, persistent cough, fever, myalgias. Weight down 6 pounds in past 2 months. BMI 17.61.  - discontinued trazodone, trial of mirtazepine  - CBC, TSH, BMP

## 2021-05-24 NOTE — Assessment & Plan Note (Signed)
Current regimen includes fenofibrate 48mg  and rosuvastatin 5mg  daily.  Direct TG level

## 2021-05-28 NOTE — Progress Notes (Signed)
   Subjective:   Patient ID: Pamela [name]    DOB: 12-19-1950, 70 y.o. female   MRN: [id number]  Pamela [name] is a 70 y.o. female with a history of HTN, GERD, diverticulosis, T2DM, hearing loss, osteoporosis, perimenopasual atrophic vaginitis, CKD III, sleep disorder, RLS, nocturnal muscle cramps, insomnia, hypokalemia, hypercalcemia, HLD, headache, constipation, cholesterol granuloma, cramp of b/l LE here for follow up  HPI Patient seen on 6/14 with PCP for weight loss and poor PO intake in setting of recent nausea/vomiting. Her labs were notable for Na 132, K3.3. Normal kidneys. Normal CBC and TSH. 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 She endorses compliance. Today she notes her nausea has improved and shes been working on eating more.  Last weight: 105.8lbs Weight today: 108lbs  Review of Systems:  Per HPI.   Objective:   BP 110/70   Pulse 74   Ht 5\' 5"  (1.651 m)   Wt 108 lb (49 kg)   SpO2 98%   BMI 17.97 kg/m  Vitals and nursing note reviewed.  General: pleasant thin older female, sitting comfortably in exam chair, well nourished, well developed, in no acute distress with non-toxic appearance CV: regular rate and rhythm without murmurs, rubs, or gallops, no lower extremity edema, 2+ radial and pedal pulses bilaterally Lungs: clear to auscultation bilaterally with normal work of breathing on room air, speaking in full sentences Abdomen: soft, non-tender, non-distended, no masses or organomegaly palpable, normoactive bowel sounds Skin: warm, dry Extremities: warm and well perfused, normal tone MSK: gait normal Neuro: Alert and oriented, speech normal  Assessment & Plan:   Hypokalemia Chronic, 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 BMP today to monitor.  Hyponatremia Acute. Possibly in setting of recent nausea/vomiting. BMP today to monitor.  Weight loss Improved. Likely in setting of recent nausea/vomiting that has now resolved. She has gained 3  lbs since last visit and her appetite has improved. Recommended follow up with PCP in 1 month to continue to monitor.  Orders Placed This Encounter  Procedures   Hepatic Function Panel   Basic Metabolic Panel    No orders of the defined types were placed in this encounter.    , DO PGY-3, [facility] 05/30/2021 1:31 PM

## 2021-05-30 ENCOUNTER — Encounter: Payer: Self-pay | Admitting: Family Medicine

## 2021-05-30 ENCOUNTER — Ambulatory Visit (INDEPENDENT_AMBULATORY_CARE_PROVIDER_SITE_OTHER): Payer: Medicare HMO | Admitting: Family Medicine

## 2021-05-30 ENCOUNTER — Other Ambulatory Visit: Payer: Self-pay

## 2021-05-30 VITALS — BP 110/70 | HR 74 | Ht 65.0 in | Wt 108.0 lb

## 2021-05-30 DIAGNOSIS — E871 Hypo-osmolality and hyponatremia: Secondary | ICD-10-CM | POA: Insufficient documentation

## 2021-05-30 DIAGNOSIS — R634 Abnormal weight loss: Secondary | ICD-10-CM

## 2021-05-30 DIAGNOSIS — E876 Hypokalemia: Secondary | ICD-10-CM

## 2021-05-30 NOTE — Assessment & Plan Note (Signed)
Acute. Possibly in setting of recent nausea/vomiting. BMP today to monitor.

## 2021-05-30 NOTE — Assessment & Plan Note (Signed)
Improved. Likely in setting of recent nausea/vomiting that has now resolved. She has gained 3 lbs since last visit and her appetite has improved. Recommended follow up with PCP in 1 month to continue to monitor.

## 2021-05-30 NOTE — Assessment & Plan Note (Signed)
Chronic, 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 BMP today to monitor.

## 2021-05-30 NOTE — Patient Instructions (Signed)
I am getting lab work today to evaluate your electrolytes You have gained 3lbs!! Congratulations. I am so happy you are feeling better! I will call you if there are any abnormalities in your labs and further recommendations. I would ike you to schedule appointment with Dr. Neita Garnet for 1 month to continue to monitor.

## 2021-05-31 ENCOUNTER — Encounter: Payer: Self-pay | Admitting: Family Medicine

## 2021-05-31 LAB — HEPATIC FUNCTION PANEL
ALT: 20 IU/L (ref 0–32)
AST: 17 IU/L (ref 0–40)
Albumin: 4.3 g/dL (ref 3.8–4.8)
Alkaline Phosphatase: 149 IU/L — ABNORMAL HIGH (ref 44–121)
Bilirubin Total: 0.8 mg/dL (ref 0.0–1.2)
Bilirubin, Direct: 0.28 mg/dL (ref 0.00–0.40)
Total Protein: 7.3 g/dL (ref 6.0–8.5)

## 2021-05-31 LAB — BASIC METABOLIC PANEL
BUN/Creatinine Ratio: 12 (ref 12–28)
BUN: 11 mg/dL (ref 8–27)
CO2: 27 mmol/L (ref 20–29)
Calcium: 9.9 mg/dL (ref 8.7–10.3)
Chloride: 100 mmol/L (ref 96–106)
Creatinine, Ser: 0.95 mg/dL (ref 0.57–1.00)
Glucose: 145 mg/dL — ABNORMAL HIGH (ref 65–99)
Potassium: 4.2 mmol/L (ref 3.5–5.2)
Sodium: 140 mmol/L (ref 134–144)
eGFR: 65 mL/min/{1.73_m2} (ref >59)

## 2021-06-08 ENCOUNTER — Other Ambulatory Visit: Payer: Self-pay | Admitting: Family Medicine

## 2021-06-13 ENCOUNTER — Other Ambulatory Visit: Payer: Self-pay | Admitting: Family Medicine

## 2021-06-13 DIAGNOSIS — I10 Essential (primary) hypertension: Secondary | ICD-10-CM

## 2021-06-13 MED ORDER — AMLODIPINE BESYLATE 10 MG PO TABS: 10.0000 mg | ORAL_TABLET | Freq: Every day | ORAL | 1 refills | Status: DC

## 2021-06-20 ENCOUNTER — Other Ambulatory Visit: Payer: Self-pay

## 2021-06-20 DIAGNOSIS — I10 Essential (primary) hypertension: Secondary | ICD-10-CM

## 2021-06-20 MED ORDER — HYDROCHLOROTHIAZIDE 25 MG PO TABS: 25.0000 mg | ORAL_TABLET | Freq: Every day | ORAL | 1 refills | Status: DC

## 2021-06-30 NOTE — Progress Notes (Addendum)
    SUBJECTIVE:   CHIEF COMPLAINT / HPI: follow up for weight loss   Dysphagia   Patient presents with 1 week of new sensation of food getting stuck in her chest.  She localizes the area to where food and fluids feel stuck to her subxiphoid area.  She reports that she has had this happen to her before and was set up to have an EGD but the symptoms went away so she never had the study.  She reports that she has been trying to eat smaller bites and softer foods which does seem to help.  She reports that the symptoms are worse when trying to eat beef.  She states that when this happened before, she was taken to acid reflux medications and the symptoms went away for several years.  She states that she has been trying to drink 2-3 Ensure drinks per day to help with her recent weight loss but states that she continues to lose weight due to difficulty swallowing.  Patient is requesting referral to gastroenterology for EGD study.  She states that her symptoms are worse if she tries to eat before lying down to go to sleep.  She states that she continues to have increased appetite but due to difficulty swallowing cannot eat to satisfy her appetite.  She denies abdominal pain, nausea, emesis or chest pain or difficulty breathing.  She does report an episode of coughing 1 time that was associated with the feeling of food being stuck.  PERTINENT  PMH / PSH:  Diabetes Hypertension GERD   OBJECTIVE:   BP 126/80   Pulse 81   Ht 5\' 6"  (1.676 m)   Wt 105 lb 3.2 oz (47.7 kg)   SpO2 99%   BMI 16.98 kg/m   General: female appearing stated age in no acute distress HEENT: MMM, oral thrush on patient's tongue, neck non-tender without lymphadenopathy, masses or thyromegaly, no tenderness to palpation of patient's neck Cardio: Normal S1 and S2, no S3 or S4. Rhythm is regular. No murmurs or rubs.  Bilateral radial pulses palpable Pulm: Clear to auscultation bilaterally, no crackles, wheezing, or diminished breath  sounds. Normal respiratory effort, stable on room air Abdomen: Bowel sounds normal. Abdomen soft and non-tender.  ASSESSMENT/PLAN:   Globus sensation Patient reports globus sensation as well as dysphagia that has been present for the last week.  It is affecting patient's ability to eat and given her continued weight loss, will refer to gastroenterology for EGD study. -We will prescribe omeprazole trial to see if this helps with her GERD/dysphagia -Counseled patient to notify our office if she does not hear from gastroenterology within the next 2 weeks for appointment scheduling, patient was understanding  Oral thrush Asymptomatic. -Prescribed oral rinse for patient to use up to 4 times daily for oral thrush   F/u 2-3 weeks    , MD [facility]

## 2021-07-01 ENCOUNTER — Other Ambulatory Visit: Payer: Self-pay

## 2021-07-01 ENCOUNTER — Ambulatory Visit (INDEPENDENT_AMBULATORY_CARE_PROVIDER_SITE_OTHER): Payer: Medicare HMO | Admitting: Family Medicine

## 2021-07-01 ENCOUNTER — Encounter: Payer: Self-pay | Admitting: Family Medicine

## 2021-07-01 VITALS — BP 126/80 | HR 81 | Ht 66.0 in | Wt 105.2 lb

## 2021-07-01 DIAGNOSIS — R198 Other specified symptoms and signs involving the digestive system and abdomen: Secondary | ICD-10-CM | POA: Diagnosis not present

## 2021-07-01 DIAGNOSIS — R1319 Other dysphagia: Secondary | ICD-10-CM | POA: Diagnosis not present

## 2021-07-01 DIAGNOSIS — B370 Candidal stomatitis: Secondary | ICD-10-CM | POA: Insufficient documentation

## 2021-07-01 DIAGNOSIS — R0989 Other specified symptoms and signs involving the circulatory and respiratory systems: Secondary | ICD-10-CM | POA: Insufficient documentation

## 2021-07-01 MED ORDER — OMEPRAZOLE 20 MG PO CPDR: 20.0000 mg | DELAYED_RELEASE_CAPSULE | Freq: Every day | ORAL | 1 refills | Status: DC

## 2021-07-01 MED ORDER — NYSTATIN 100000 UNIT/ML MT SUSP: 5.0000 mL | Freq: Four times a day (QID) | OROMUCOSAL | 0 refills | Status: DC

## 2021-07-01 NOTE — Assessment & Plan Note (Signed)
Patient reports globus sensation as well as dysphagia that has been present for the last week.  It is affecting patient's ability to eat and given her continued weight loss, will refer to gastroenterology for EGD study. -We will prescribe omeprazole trial to see if this helps with her GERD/dysphagia -Counseled patient to notify our office if she does not hear from gastroenterology within the next 2 weeks for appointment scheduling, patient was understanding

## 2021-07-01 NOTE — Patient Instructions (Addendum)
I have prescribed a medication to help with your GERD that she can take along with your Pepcid.  I have also submitted a referral to gastroenterology, please notify our office if you do not hear from their office within the next 2 weeks to schedule for your swallowing difficulties.  Please plan to follow-up with our office in 2-3 weeks to check on your thrush as well.    I have also prescribed an oral rinse to help with her thrush that is present on your tongue.

## 2021-07-01 NOTE — Assessment & Plan Note (Signed)
Asymptomatic. -Prescribed oral rinse for patient to use up to 4 times daily for oral thrush

## 2021-07-03 ENCOUNTER — Other Ambulatory Visit: Payer: Self-pay | Admitting: Family Medicine

## 2021-07-31 ENCOUNTER — Encounter: Payer: Self-pay | Admitting: Nurse Practitioner

## 2021-07-31 ENCOUNTER — Ambulatory Visit: Payer: Medicare HMO | Admitting: Nurse Practitioner

## 2021-07-31 VITALS — BP 140/84 | HR 89 | Ht 65.0 in | Wt 104.8 lb

## 2021-07-31 DIAGNOSIS — R1310 Dysphagia, unspecified: Secondary | ICD-10-CM

## 2021-07-31 DIAGNOSIS — R634 Abnormal weight loss: Secondary | ICD-10-CM | POA: Diagnosis not present

## 2021-07-31 NOTE — Progress Notes (Signed)
07/31/2021 Pamela [name] [id number] 1951-07-28   Chief Complaint: Dysphagia   History of Present Illness: Pamela [name] is a 70 year old female with a past medical history of hypertension, CKD stage III, diverticulitis and GERD.  She was last seen in office by Dr. Havery Moros on 11/25/2018 due to having reflux symptoms and dysphagia for the past 2 months.  At that time, she had been on Protonix 40 mg daily for many years.  She reported having difficulty swallowing foods such as meat without significant heartburn.  An EGD was recommended but was not completed.  She stated her dysphagia symptoms abated so she did not pursue an EGD.  She presents to our office today with recurrent dysphagia which started 1 month ago.  She describes having food which gets stuck to the mid esophageal area which occurs approximately 3 days weekly.  No specific food triggers.  She typically will drink water to help pass the stuck food.  She denies ever gagging or vomiting out the stuck food.  At nighttime, she sometimes lays down in bed and feels as if food may still be stuck in the midesophagus.  She has missing back teeth which makes it difficult to chew food thoroughly.  No heartburn or upper abdominal pain.  She took Omeprazole for a few weeks improvement.  She has lost a total of 14 pounds over the past year, the last 4 pounds of weight loss occurred over the past month.  No fever, sweats or chills.  She is a non-smoker.  She is passing a normal formed Gravois bowel movement every other day.  No rectal bleeding or black stools.  No lower abdominal pain.  She underwent a colonoscopy by Dr. Deatra Ina 01/03/2014 which showed diverticulosis in the ascending, transverse, descending and sigmoid colon.  No polyps.  She was advised to repeat a colonoscopy January 2025.  No known family history of esophageal, gastric or colon cancer.  No NSAID use.  She has a white coating on her tongue and she stated her PCP prescribed Nystatin oral  solution which she has not yet initiated.  No recent antibiotic or steroid use.  No other complaints at this time.   CBC Latest Ref Rng & Units 05/21/2021 02/21/2020 08/25/2019  WBC 3.4 - 10.8 x10E3/uL 4.4 7.1 6.5  Hemoglobin 11.1 - 15.9 g/dL 12.1 14.0 13.4  Hematocrit 34.0 - 46.6 % 34.7 40.4 38.9  Platelets 150 - 450 x10E3/uL 158 295 261    CMP Latest Ref Rng & Units 05/30/2021 05/21/2021 04/02/2021  Glucose 65 - 99 mg/dL 145(H) 152(H) 129(H)  BUN 8 - 27 mg/dL _0 Creatinine 0.57 - 1.00 mg/dL 0.95 0.94 1.16(H)  Sodium 134 - 144 mmol/L 140 132(L) 138  Potassium 3.5 - 5.2 mmol/L 4.2 3.3(L) 3.8  Chloride 96 - 106 mmol/L 100 89(L) 95(L)  CO2 20 - 29 mmol/L _1 Calcium 8.7 - 10.3 mg/dL 9.9 9.7 10.0  Total Protein 6.0 - 8.5 g/dL 7.3 - -  Total Bilirubin 0.0 - 1.2 mg/dL 0.8 - -  Alkaline Phos 44 - 121 IU/L 149(H) - -  AST 0 - 40 IU/L 17 - -  ALT 0 - 32 IU/L 20 - -    Current Outpatient Medications on File Prior to Visit  Medication Sig Dispense Refill   acetaminophen (TYLENOL) 500 MG tablet Take 1,000 mg by mouth every 6 (six) hours as needed.     amLODipine (NORVASC) 10 MG tablet Take 1  tablet (10 mg total) by mouth at bedtime. 90 tablet 1   aspirin EC 81 MG tablet Take 81 mg by mouth daily.     Blood Pressure Monitoring (BLOOD PRESSURE CUFF) MISC 1 Device by Does not apply route daily. 1 each 0   carvedilol (COREG) 3.125 MG tablet TAKE 1 TABLET (3.125 MG TOTAL) BY MOUTH 2 (TWO) TIMES DAILY WITH A MEAL. 180 tablet 0   famotidine (PEPCID) 40 MG tablet TAKE 1 TABLET BY MOUTH EVERY DAY 90 tablet 1   fenofibrate (TRICOR) 48 MG tablet Take 1 tablet (48 mg total) by mouth daily. 90 tablet 1   hydrochlorothiazide (HYDRODIURIL) 25 MG tablet Take 1 tablet (25 mg total) by mouth daily. 90 tablet 1   mirtazapine (REMERON) 7.5 MG tablet TAKE 1 TABLET BY MOUTH AT BEDTIME. 90 tablet 1   nystatin (MYCOSTATIN) 100000 UNIT/ML suspension Take 5 mLs (500,000 Units total) by mouth 4 (four) times  daily. Apply 64m to each cheek 473 mL 0   rosuvastatin (CRESTOR) 5 MG tablet Take 5 mg by mouth daily.     senna (SENOKOT) 8.6 MG TABS tablet Take 1 tablet (8.6 mg total) by mouth daily. 120 tablet 0   VITAMIN E COMPLEX PO Take 1 tablet by mouth daily.     [DISCONTINUED] traZODone (DESYREL) 50 MG tablet TAKE 0.5-1 TABLETS (25-50 MG TOTAL) BY MOUTH AT BEDTIME AS NEEDED FOR SLEEP. 90 tablet 0   No current facility-administered medications on file prior to visit.   Allergies  Allergen Reactions   Lisinopril Swelling    Angioedema without airway obstruction    Current Medications, Allergies, Past Medical History, Past Surgical History, Family History and Social History were reviewed in [employer].  Review of Systems:   Constitutional: Negative for fever, sweats, chills or weight loss.  Respiratory: Negative for shortness of breath.   Cardiovascular: Negative for chest pain, palpitations and leg swelling.  Gastrointestinal: See HPI.  Musculoskeletal: Negative for back pain or muscle aches.  Neurological: Negative for dizziness, headaches or paresthesias.   Physical Exam: BP 140/84   Pulse 89   Ht _0  (1.651 m)   Wt 104 lb 12.8 oz (47.5 kg)   SpO2 98%   BMI 17.44 kg/m  Wt Readings from Last 3 Encounters:  07/31/21 104 lb 12.8 oz (47.5 kg)  07/01/21 105 lb 3.2 oz (47.7 kg)  05/30/21 108 lb (49 kg)    General: 70year old female in no acute distress. Head: Normocephalic and atraumatic. Eyes: No scleral icterus. Conjunctiva pink . Ears: Normal auditory acuity. Mouth: Dentition intact.  Scattered geographic pattern of white plaques on the tongue. Lungs: Clear throughout to auscultation. Heart: Regular rate and rhythm, no murmur. Abdomen: Soft, nontender and nondistended. No masses or hepatomegaly. Normal bowel sounds x 4 quadrants.  Rectal: Deferred.  Musculoskeletal: Symmetrical with no gross deformities. Extremities: No edema. Neurological:  Alert oriented x 4. No focal deficits.  Psychological: Alert and cooperative. Normal mood and affect  Assessment and Recommendations:  188 70year old female with recurrent dysphagia.  -EGD to rule out candidiasis esophagitis/GERD/upper GI malignancy benefits and risks discussed including risk with sedation, risk of bleeding, perforation and infection  -Start Nystatin suspension p.o. qid as prescribed by PCP for oral thrush -If she has esophageal candidiasis she will require Fluconazole po x 14 days  -Ensure or boost 2 cans daily -Soft food -Continue Famotidine 413mpo QD for now  -Patient to call our office if her  symptoms worsen  2) Colon cancer screening  -Next colonoscopy due January 2025  3) Weight loss, likely due to dysphagia and decreased food intake  -See plan in # 1 -Consider chest/abd/pelvic CT if EGD unrevealing   4) Elevated alk phos with normal T. Bil and AST/ALT levels. Labs 05/30/2021 Alk phos 149. Note I did not acknowledge the elevated alk phos level while the patient was in office. RUQ sono 08/08/2020 done due to elevated Alk phos level showed a normal liver, no gallstones and normal CBD.  -My nurse will contact the patient to have a hepatic panel and GGT level done next week.

## 2021-07-31 NOTE — Progress Notes (Signed)
Agree with assessment and plan as outlined.  

## 2021-07-31 NOTE — Patient Instructions (Signed)
Drink Ensure or Boost 8oz can or bottle twice daily.  Try eating soft foods.   Call office if symptoms worsen before procedure.   You have been scheduled for an endoscopy. Please follow written instructions given to you at your visit today. If you use inhalers (even only as needed), please bring them with you on the day of your procedure.  If you are age 70 or older, your body mass index should be between 23-30. Your Body mass index is 17.44 kg/m. If this is out of the aforementioned range listed, please consider follow up with your Primary Care Provider. __________________________________________________________  The [facility] providers would like to encourage you to use [facility] to communicate with providers for non-urgent requests or questions.  Due to long hold times on the telephone, sending your provider a message by [facility] may be a faster and more efficient way to get a response.  Please allow 48 business hours for a response.  Please remember that this is for non-urgent requests.   Thank you for choosing me and  Gastroenterology.  [employer] CRNP

## 2021-08-01 ENCOUNTER — Telehealth: Payer: Self-pay | Admitting: Nurse Practitioner

## 2021-08-01 NOTE — Telephone Encounter (Signed)
Lauren pls contact the patient and send her to our lab to have Alk phos isoenzymes, GGT, ANA, antimitochondrial antibody (AMA) level and smooth muscle antibody (SMA) level done. DX: Elevated Alk phos level. Thx

## 2021-08-05 ENCOUNTER — Other Ambulatory Visit: Payer: Self-pay | Admitting: *Deleted

## 2021-08-05 DIAGNOSIS — R748 Abnormal levels of other serum enzymes: Secondary | ICD-10-CM

## 2021-08-05 NOTE — Progress Notes (Signed)
Labs placed in Epic. Spoke with patient, patient is aware to come in labs and verbalized understanding.,

## 2021-08-06 ENCOUNTER — Other Ambulatory Visit (INDEPENDENT_AMBULATORY_CARE_PROVIDER_SITE_OTHER): Payer: Medicare HMO

## 2021-08-06 ENCOUNTER — Other Ambulatory Visit: Payer: Self-pay

## 2021-08-06 ENCOUNTER — Encounter: Payer: Medicare HMO | Admitting: Gastroenterology

## 2021-08-06 ENCOUNTER — Telehealth: Payer: Self-pay | Admitting: Gastroenterology

## 2021-08-06 DIAGNOSIS — R748 Abnormal levels of other serum enzymes: Secondary | ICD-10-CM | POA: Diagnosis not present

## 2021-08-06 LAB — GAMMA GT: GGT: 157 U/L — ABNORMAL HIGH (ref 7–51)

## 2021-08-06 NOTE — Telephone Encounter (Signed)
Good Afternoon  Dr. Adela Lank,  I called this patient at 10:45 am to see if she was coming for her EGD today.  She stated that she received a phone call from nurse yesterday and told her to come have labs today a day before her procedure.  It confused her so she came and had labs done however she was going to come in tomorrow for procedure. I rescheduled her for 08-13-21.

## 2021-08-06 NOTE — Telephone Encounter (Signed)
That is unfortunate.  Thanks for letting me know. 

## 2021-08-09 LAB — ANA: Anti Nuclear Antibody (ANA): POSITIVE — AB

## 2021-08-09 LAB — ANTI-NUCLEAR AB-TITER (ANA TITER): ANA Titer 1: 1:40 {titer} — ABNORMAL HIGH

## 2021-08-09 LAB — ANTI-SMOOTH MUSCLE ANTIBODY, IGG: Actin (Smooth Muscle) Antibody (IGG): 25 U — ABNORMAL HIGH (ref <20)

## 2021-08-09 LAB — MITOCHONDRIAL ANTIBODIES: Mitochondrial M2 Ab, IgG: <=20 U

## 2021-08-12 ENCOUNTER — Encounter: Payer: Self-pay | Admitting: Certified Registered Nurse Anesthetist

## 2021-08-13 ENCOUNTER — Encounter: Payer: Self-pay | Admitting: Gastroenterology

## 2021-08-13 ENCOUNTER — Ambulatory Visit (AMBULATORY_SURGERY_CENTER): Payer: Medicare HMO | Admitting: Gastroenterology

## 2021-08-13 ENCOUNTER — Other Ambulatory Visit: Payer: Self-pay

## 2021-08-13 VITALS — BP 142/79 | HR 77 | Temp 98.2°F | Resp 13 | Ht 65.0 in | Wt 104.0 lb

## 2021-08-13 DIAGNOSIS — K449 Diaphragmatic hernia without obstruction or gangrene: Secondary | ICD-10-CM | POA: Diagnosis not present

## 2021-08-13 DIAGNOSIS — I10 Essential (primary) hypertension: Secondary | ICD-10-CM | POA: Diagnosis not present

## 2021-08-13 DIAGNOSIS — K2970 Gastritis, unspecified, without bleeding: Secondary | ICD-10-CM

## 2021-08-13 DIAGNOSIS — K209 Esophagitis, unspecified without bleeding: Secondary | ICD-10-CM | POA: Diagnosis not present

## 2021-08-13 DIAGNOSIS — R1310 Dysphagia, unspecified: Secondary | ICD-10-CM | POA: Diagnosis not present

## 2021-08-13 DIAGNOSIS — E119 Type 2 diabetes mellitus without complications: Secondary | ICD-10-CM | POA: Diagnosis not present

## 2021-08-13 DIAGNOSIS — R634 Abnormal weight loss: Secondary | ICD-10-CM | POA: Diagnosis not present

## 2021-08-13 DIAGNOSIS — K225 Diverticulum of esophagus, acquired: Secondary | ICD-10-CM

## 2021-08-13 MED ORDER — SODIUM CHLORIDE 0.9 % IV SOLN: 500.0000 mL | Freq: Once | INTRAVENOUS | Status: DC

## 2021-08-13 MED ORDER — FLUCONAZOLE 100 MG PO TABS: ORAL_TABLET | ORAL | 0 refills | Status: AC

## 2021-08-13 NOTE — Progress Notes (Signed)
1358 HR > 100 with esmolol 25 mg given IV, MD updated, vss 

## 2021-08-13 NOTE — Progress Notes (Signed)
1350 Robinul 0.1 mg IV given due large amount of secretions upon assessment.  MD made aware, vss  

## 2021-08-13 NOTE — Patient Instructions (Signed)
Handout on hiatal hernia given to you today   YOU HAD AN ENDOSCOPIC PROCEDURE TODAY AT THE [facility]:   Refer to the procedure report that was given to you for any specific questions about what was found during the examination.  If the procedure report does not answer your questions, please call your gastroenterologist to clarify.  If you requested that your care partner not be given the details of your procedure findings, then the procedure report has been included in a sealed envelope for you to review at your convenience later.  YOU SHOULD EXPECT: Some feelings of bloating in the abdomen. Passage of more gas than usual.  Walking can help get rid of the air that was put into your GI tract during the procedure and reduce the bloating. If you had a lower endoscopy (such as a colonoscopy or flexible sigmoidoscopy) you may notice spotting of blood in your stool or on the toilet paper. If you underwent a bowel prep for your procedure, you may not have a normal bowel movement for a few days.  Please Note:  You might notice some irritation and congestion in your nose or some drainage.  This is from the oxygen used during your procedure.  There is no need for concern and it should clear up in a day or so.  SYMPTOMS TO REPORT IMMEDIATELY:   Following upper endoscopy (EGD)  Vomiting of blood or coffee ground material  New chest pain or pain under the shoulder blades  Painful or persistently difficult swallowing  New shortness of breath  Fever of 100F or higher  Black, tarry-looking stools  For urgent or emergent issues, a gastroenterologist can be reached at any hour by calling [phone]. Do not use MyChart messaging for urgent concerns.    DIET:  We do recommend a small meal at first, but then you may proceed to your regular diet.  Drink plenty of fluids but you should avoid alcoholic beverages for 24 hours.  ACTIVITY:  You should plan to take it easy for the rest of today and  you should NOT DRIVE or use heavy machinery until tomorrow (because of the sedation medicines used during the test).    FOLLOW UP: Our staff will call the number listed on your records 48-72 hours following your procedure to check on you and address any questions or concerns that you may have regarding the information given to you following your procedure. If we do not reach you, we will leave a message.  We will attempt to reach you two times.  During this call, we will ask if you have developed any symptoms of COVID 19. If you develop any symptoms (ie: fever, flu-like symptoms, shortness of breath, cough etc.) before then, please call [phone].  If you test positive for Covid 19 in the 2 weeks post procedure, please call and report this information to us.    If any biopsies were taken you will be contacted by phone or by letter within the next 1-3 weeks.  Please call us at [phone] if you have not heard about the biopsies in 3 weeks.    SIGNATURES/CONFIDENTIALITY: You and/or your care partner have signed paperwork which will be entered into your electronic medical record.  These signatures attest to the fact that that the information above on your After Visit Summary has been reviewed and is understood.  Full responsibility of the confidentiality of this discharge information lies with you and/or your care-partner.  

## 2021-08-13 NOTE — Op Note (Signed)
[facility] Patient Name: Pamela BouillonJulia [name] Procedure Date: 08/13/2021 11:40 AM MRN: [id number] Endoscopist: Viviann SpareSteven P. Adela LankArmbruster , MD Age: 70 Referring MD:  Date of Birth: 20-Oct-1951 Gender: Female Account #: [id number] Procedure:                Upper GI endoscopy Indications:              Dysphagia Medicines:                Monitored Anesthesia Care Procedure:                Pre-Anesthesia Assessment:                           - Prior to the procedure, a History and Physical                            was performed, and patient medications and                            allergies were reviewed. The patient's tolerance of                            previous anesthesia was also reviewed. The risks                            and benefits of the procedure and the sedation                            options and risks were discussed with the patient.                            All questions were answered, and informed consent                            was obtained. Prior Anticoagulants: The patient has                            taken no previous anticoagulant or antiplatelet                            agents. ASA Grade Assessment: II - A patient with                            mild systemic disease. After reviewing the risks                            and benefits, the patient was deemed in                            satisfactory condition to undergo the procedure.                           After obtaining informed consent, the endoscope was  passed under direct vision. Throughout the                            procedure, the patient's blood pressure, pulse, and                            oxygen saturations were monitored continuously. The                            GIF [id number] #[id number] was introduced through the                            mouth, and advanced to the second part of duodenum.                            The upper GI endoscopy was accomplished  without                            difficulty. The patient tolerated the procedure                            well. Scope In: Scope Out: Findings:                 Esophagogastric landmarks were identified: the                            Z-line was found at 35 cm, the gastroesophageal                            junction was found at 35 cm and the upper extent of                            the gastric folds was found at 36 cm from the                            incisors.                           A 1 cm hiatal hernia was present.                           A diverticulum with a small opening was found in                            the lower third of the esophagus.                           Diffuse, white plaques were found in the upper                            third of the esophagus and in the middle third of  the esophagus. This was also noted on the tongue                            and posterior pharynx, concerning for candidiasis.                           The exam of the esophagus was otherwise without                            abnormality. Perhaps some mild trachealization of                            the esophagus but no overt or dominant stricture.                           Biopsies were taken with a cold forceps in the                            upper third of the esophagus, in the middle third                            of the esophagus and in the lower third of the                            esophagus for histology.                           The entire examined stomach was normal.                           The duodenal bulb and second portion of the                            duodenum were normal. Complications:            No immediate complications. Estimated blood loss:                            Minimal. Estimated Blood Loss:     Estimated blood loss was minimal. Impression:               - Esophagogastric landmarks identified.                            - 1 cm hiatal hernia.                           - Diverticulum in the lower third of the esophagus.                           - Esophageal plaques were found, suspicious for                            candidiasis.                           -  Perhaps mild trachealization but no dominant                            stricture - biopsies taken to rule out EoE.                           - Normal stomach.                           - Normal duodenal bulb and second portion of the                            duodenum.                           Recommend treating suspected esophageal candidiasis                            and see how the patient responds. Consider barium                            swallow and or manometry pending her course, to                            assess for dysmotility. Recommendation:           - Patient has a contact number available for                            emergencies. The signs and symptoms of potential                            delayed complications were discussed with the                            patient. Return to normal activities tomorrow.                            Written discharge instructions were provided to the                            patient.                           - Resume previous diet.                           - Continue present medications.                           - Patient has not yet started Nystatin (previously                            prescribed for thrush). If no contraindications                            recommend  Fluconazole -  tabs - 2 tabs PO x 1                            day, then 1 tab po for another 13 days                           - Await pathology results and course following                            Fluconazole. May consider barium swallow and or                            manometry study to further evaluate Pamela [name] P. Milika Ventress, MD 08/13/2021 2:14:45 PM This report has been signed electronically.

## 2021-08-13 NOTE — Progress Notes (Signed)
History and Physical Interval Note: No changes since she was last seen in the office on 8/24. She has been unable to pickup nystatin for suspected candidiasis. Continues to have dysphagia intermittently and some weight loss, has been ongoing and unchanged. NO cardiopulmonary symptoms and otherwise at baseline. Exam normal / unchanged since last visit. Plan for EGD with possible dilation today.  08/13/2021 1:53 PM  [name]  has presented today for endoscopic procedure(s), with the diagnosis of  Encounter Diagnosis  Name Primary?   Dysphagia, unspecified type Yes  .  The various methods of evaluation and treatment have been discussed with the patient and/or family. After consideration of risks, benefits and other options for treatment, the patient has consented to  the endoscopic procedure(s).   The patient's history has been reviewed, patient examined, no change in status, stable for surgery.  I have reviewed the patient's chart and labs.  Questions were answered to the patient's satisfaction.    Harlin Rain, MD [facility]

## 2021-08-13 NOTE — Progress Notes (Signed)
Vs by DT. 

## 2021-08-13 NOTE — Progress Notes (Signed)
Report given to PACU, vss 

## 2021-08-13 NOTE — Progress Notes (Signed)
Called to room to assist during endoscopic procedure.  Patient ID and intended procedure confirmed with present staff. Received instructions for my participation in the procedure from the performing physician.  

## 2021-08-14 ENCOUNTER — Other Ambulatory Visit: Payer: Self-pay

## 2021-08-14 MED ORDER — NYSTATIN 100000 UNIT/ML MT SUSP: 5.0000 mL | Freq: Four times a day (QID) | OROMUCOSAL | 0 refills | Status: DC

## 2021-08-14 NOTE — Telephone Encounter (Signed)
Patient calls nurse line requesting refill on nystatin. Patient reports that she had endoscopy yesterday and provider said she still has oral thrush. She was advised to contact our office for refill.   To PCP.   Veronda Prude, RN

## 2021-08-15 ENCOUNTER — Telehealth: Payer: Self-pay

## 2021-08-15 NOTE — Telephone Encounter (Signed)
  Follow up Call-  Call back number 08/13/2021  Post procedure Call Back phone  # [phone]  Permission to leave phone message Yes  Some recent data might be hidden     Patient questions:  Do you have a fever, pain , or abdominal swelling? No. Pain Score  0 *  Have you tolerated food without any problems? Yes.    Have you been able to return to your normal activities? Yes.    Do you have any questions about your discharge instructions: Diet   No. Medications  No. Follow up visit  No.  Do you have questions or concerns about your Care? No.  Actions: * If pain score is 4 or above: No action needed, pain <4.  Have you developed a fever since your procedure? No   2.   Have you had an respiratory symptoms (SOB or cough) since your procedure? No   3.   Have you tested positive for COVID 19 since your procedure no   4.   Have you had any family members/close contacts diagnosed with the COVID 19 since your procedure?  No    If yes to any of these questions please route to Laverna Peace, RN and Karlton Lemon, RN

## 2021-08-16 ENCOUNTER — Other Ambulatory Visit: Payer: Self-pay | Admitting: Family Medicine

## 2021-09-03 ENCOUNTER — Other Ambulatory Visit: Payer: Self-pay

## 2021-09-03 DIAGNOSIS — R748 Abnormal levels of other serum enzymes: Secondary | ICD-10-CM

## 2021-09-05 ENCOUNTER — Other Ambulatory Visit: Payer: Self-pay | Admitting: Family Medicine

## 2021-09-10 ENCOUNTER — Other Ambulatory Visit (INDEPENDENT_AMBULATORY_CARE_PROVIDER_SITE_OTHER): Payer: Medicare HMO

## 2021-09-10 DIAGNOSIS — R748 Abnormal levels of other serum enzymes: Secondary | ICD-10-CM

## 2021-09-10 LAB — HEPATIC FUNCTION PANEL
ALT: 19 U/L (ref 0–35)
AST: 22 U/L (ref 0–37)
Albumin: 4.4 g/dL (ref 3.5–5.2)
Alkaline Phosphatase: 116 U/L (ref 39–117)
Bilirubin, Direct: 0.3 mg/dL (ref 0.0–0.3)
Total Bilirubin: 1.2 mg/dL (ref 0.2–1.2)
Total Protein: 7.9 g/dL (ref 6.0–8.3)

## 2021-09-11 LAB — IGG: IgG (Immunoglobin G), Serum: 1592 mg/dL — ABNORMAL HIGH (ref 600–1540)

## 2021-09-16 ENCOUNTER — Telehealth: Payer: Self-pay

## 2021-09-16 ENCOUNTER — Other Ambulatory Visit: Payer: Self-pay

## 2021-09-16 DIAGNOSIS — R748 Abnormal levels of other serum enzymes: Secondary | ICD-10-CM

## 2021-09-16 NOTE — Telephone Encounter (Signed)
Left message for patient to please call back. 

## 2021-09-16 NOTE — Telephone Encounter (Signed)
-----  Message from Noralyn Pick, NP sent at 09/16/2021 12:00 PM EDT ----- Dr. Havery Moros, pls review labs. IgG just slightly elevated and alk phos now normal. (Refer to labs done 08/06/3032 and your instructions at that time were as follows "check a total IgG level to see where that is. Her ANA and SMA are both just weakly positive. AMA negative. If she has a persistent elevation in AP otherwise then yes MRCP would be next step to clear her biliary tree. She had an [place] one year ago".   Her alk phos is now normal therefore I will not order an MRCP at this time.  I will repeat a hepatic panel in 3 months.  Let me know if you have other recommendations thank you.  Beth, please contact the patient and let her know her alk phos level was now normal and provide her with a lab order to repeat a hepatic panel in 3 months.  Thank you

## 2021-09-17 NOTE — Telephone Encounter (Signed)
Pt returned your call and asked if you would please call her back.  Thank you.

## 2021-09-17 NOTE — Telephone Encounter (Signed)
See result note.  

## 2021-09-21 ENCOUNTER — Other Ambulatory Visit: Payer: Self-pay | Admitting: Family Medicine

## 2021-09-26 ENCOUNTER — Other Ambulatory Visit: Payer: Self-pay | Admitting: Family Medicine

## 2021-10-01 NOTE — Patient Instructions (Signed)
It was a pleasure to see you today!  Thank you for choosing [facility] for your primary care.   Pamela [name] was seen for bruising.   Our plans for today were: We will check your blood work for any signs of what may be causing your bruising.   To keep you healthy, please keep in mind the following health maintenance items that you are due for:   Shingles Vaccine   You should return to our clinic in 4-6 weeks for follow up for bruising.   Best Wishes,   Dr. Neita Garnet

## 2021-10-04 ENCOUNTER — Encounter: Payer: Self-pay | Admitting: Family Medicine

## 2021-10-04 ENCOUNTER — Ambulatory Visit (INDEPENDENT_AMBULATORY_CARE_PROVIDER_SITE_OTHER): Payer: Medicare HMO | Admitting: Family Medicine

## 2021-10-04 ENCOUNTER — Other Ambulatory Visit: Payer: Self-pay

## 2021-10-04 VITALS — BP 145/72 | HR 86 | Ht 65.0 in | Wt 107.6 lb

## 2021-10-04 DIAGNOSIS — T148XXA Other injury of unspecified body region, initial encounter: Secondary | ICD-10-CM

## 2021-10-04 DIAGNOSIS — Z23 Encounter for immunization: Secondary | ICD-10-CM

## 2021-10-04 DIAGNOSIS — R233 Spontaneous ecchymoses: Secondary | ICD-10-CM | POA: Diagnosis not present

## 2021-10-04 NOTE — Progress Notes (Signed)
    SUBJECTIVE:   CHIEF COMPLAINT / HPI: Bruising  Patient reports that she had a large bruise that was not associated with any trauma develop on the left side of her chest.  She was unsure of what causes bruising and decided to be evaluated.  She states that she often has bruises that will occur on her legs from time to time without any trauma.  Patient states that since noticing the bruise, it is completely resolved and she has no other bruises at this time.  Patient is agreeable to lab work to assess for any possible causes of bruising and states that she would like to get her influenza vaccine today and follow-up as needed.  She denies any hematuria or other signs of bleeding.  PERTINENT  PMH / PSH:  Hypertension Weight loss  OBJECTIVE:   BP (!) 145/72   Pulse 86   Ht 5\' 5"  (1.651 m)   Wt 107 lb 9.6 oz (48.8 kg)   SpO2 100%   BMI 17.91 kg/m   General: female appearing stated age in no acute distress Chest: no ecchymosis visualized today on chest area  Pulm: Clear to auscultation bilaterally, no crackles, wheezing, or diminished breath sounds. Normal respiratory effort Abdomen: Bowel sounds normal. Abdomen soft and non-tender.  Extremities: No peripheral edema. Warm & well perfused. No ecchymosis nor petechiae   ASSESSMENT/PLAN:   Ecchymoses, spontaneous Will collect CBC and INR/PT today to evaluate for potential causes of bruising      , MD [facility]

## 2021-10-04 NOTE — Assessment & Plan Note (Signed)
Will collect CBC and INR/PT today to evaluate for potential causes of bruising

## 2021-10-05 LAB — CBC WITH DIFFERENTIAL/PLATELET
Basophils Absolute: 0 10*3/uL (ref 0.0–0.2)
Basos: 1 %
EOS (ABSOLUTE): 0.1 10*3/uL (ref 0.0–0.4)
Eos: 2 %
Hematocrit: 36 % (ref 34.0–46.6)
Hemoglobin: 12.8 g/dL (ref 11.1–15.9)
Immature Grans (Abs): 0 10*3/uL (ref 0.0–0.1)
Immature Granulocytes: 0 %
Lymphocytes Absolute: 1.5 10*3/uL (ref 0.7–3.1)
Lymphs: 28 %
MCH: 34.1 pg — ABNORMAL HIGH (ref 26.6–33.0)
MCHC: 35.6 g/dL (ref 31.5–35.7)
MCV: 96 fL (ref 79–97)
Monocytes Absolute: 0.5 10*3/uL (ref 0.1–0.9)
Monocytes: 9 %
Neutrophils Absolute: 3.1 10*3/uL (ref 1.4–7.0)
Neutrophils: 60 %
Platelets: 274 10*3/uL (ref 150–450)
RBC: 3.75 x10E6/uL — ABNORMAL LOW (ref 3.77–5.28)
RDW: 12 % (ref 11.7–15.4)
WBC: 5.2 10*3/uL (ref 3.4–10.8)

## 2021-10-05 LAB — PROTIME-INR
INR: 1 (ref 0.9–1.2)
Prothrombin Time: 10.1 s (ref 9.1–12.0)

## 2021-10-16 ENCOUNTER — Other Ambulatory Visit (HOSPITAL_COMMUNITY): Payer: Self-pay | Admitting: Neurosurgery

## 2021-10-16 ENCOUNTER — Other Ambulatory Visit: Payer: Self-pay | Admitting: Neurosurgery

## 2021-10-16 DIAGNOSIS — H748X2 Other specified disorders of left middle ear and mastoid: Secondary | ICD-10-CM

## 2021-10-28 ENCOUNTER — Telehealth: Payer: Self-pay | Admitting: *Deleted

## 2021-10-28 NOTE — Telephone Encounter (Signed)
FYI:  Patient called and states that she is scheduled for her MRI tomorrow and wanted [place] to call in a Valium.  I explained to her that she needed to call her neurosurgeon who ordered the scan.  She will call back if there is an issue.  Pamela [name],CMA

## 2021-10-29 ENCOUNTER — Other Ambulatory Visit: Payer: Self-pay

## 2021-10-29 ENCOUNTER — Ambulatory Visit (HOSPITAL_COMMUNITY)
Admission: RE | Admit: 2021-10-29 | Discharge: 2021-10-29 | Disposition: A | Discharge status: Home / Self Care | Payer: Medicare HMO | Source: Ambulatory Visit | Attending: Neurosurgery | Admitting: Neurosurgery

## 2021-10-29 DIAGNOSIS — R220 Localized swelling, mass and lump, head: Secondary | ICD-10-CM | POA: Diagnosis not present

## 2021-10-29 DIAGNOSIS — H748X2 Other specified disorders of left middle ear and mastoid: Secondary | ICD-10-CM | POA: Diagnosis not present

## 2021-10-29 DIAGNOSIS — Q283 Other malformations of cerebral vessels: Secondary | ICD-10-CM | POA: Diagnosis not present

## 2021-10-29 DIAGNOSIS — G9389 Other specified disorders of brain: Secondary | ICD-10-CM | POA: Diagnosis not present

## 2021-10-29 MED ORDER — GADOBUTROL 1 MMOL/ML IV SOLN
5.0000 mL | Freq: Once | INTRAVENOUS | Status: AC | PRN
  Administered 2021-10-29: 5 mL via INTRAVENOUS

## 2021-11-03 ENCOUNTER — Other Ambulatory Visit: Payer: Self-pay | Admitting: Family Medicine

## 2021-11-08 ENCOUNTER — Other Ambulatory Visit: Payer: Self-pay | Admitting: Family Medicine

## 2021-11-19 DIAGNOSIS — I10 Essential (primary) hypertension: Secondary | ICD-10-CM | POA: Diagnosis not present

## 2021-11-19 DIAGNOSIS — H748X2 Other specified disorders of left middle ear and mastoid: Secondary | ICD-10-CM | POA: Diagnosis not present

## 2021-11-21 ENCOUNTER — Other Ambulatory Visit: Payer: Self-pay | Admitting: Family Medicine

## 2021-11-21 DIAGNOSIS — I10 Essential (primary) hypertension: Secondary | ICD-10-CM

## 2021-12-02 ENCOUNTER — Other Ambulatory Visit: Payer: Self-pay | Admitting: Family Medicine

## 2021-12-11 ENCOUNTER — Telehealth: Payer: Self-pay

## 2021-12-11 NOTE — Telephone Encounter (Signed)
Spoke with the patient. Reminded her of the need to come for repeat labs ( hepatic panel) and to schedule her for a follow up appointment with Dr Adela Lank 01/17/22 at 11:00 am.

## 2021-12-18 ENCOUNTER — Encounter: Payer: Self-pay | Admitting: Family Medicine

## 2021-12-18 ENCOUNTER — Ambulatory Visit (INDEPENDENT_AMBULATORY_CARE_PROVIDER_SITE_OTHER): Payer: Medicare HMO | Admitting: Family Medicine

## 2021-12-18 ENCOUNTER — Other Ambulatory Visit: Payer: Self-pay

## 2021-12-18 VITALS — BP 120/70 | HR 88 | Ht 65.0 in | Wt 105.2 lb

## 2021-12-18 DIAGNOSIS — K5901 Slow transit constipation: Secondary | ICD-10-CM

## 2021-12-18 DIAGNOSIS — Z13220 Encounter for screening for lipoid disorders: Secondary | ICD-10-CM | POA: Diagnosis not present

## 2021-12-18 DIAGNOSIS — R519 Headache, unspecified: Secondary | ICD-10-CM | POA: Diagnosis not present

## 2021-12-18 DIAGNOSIS — Z1231 Encounter for screening mammogram for malignant neoplasm of breast: Secondary | ICD-10-CM | POA: Diagnosis not present

## 2021-12-18 DIAGNOSIS — Z0001 Encounter for general adult medical examination with abnormal findings: Secondary | ICD-10-CM

## 2021-12-18 DIAGNOSIS — E785 Hyperlipidemia, unspecified: Secondary | ICD-10-CM

## 2021-12-18 DIAGNOSIS — I10 Essential (primary) hypertension: Secondary | ICD-10-CM

## 2021-12-18 DIAGNOSIS — Z8639 Personal history of other endocrine, nutritional and metabolic disease: Secondary | ICD-10-CM

## 2021-12-18 DIAGNOSIS — Z8719 Personal history of other diseases of the digestive system: Secondary | ICD-10-CM

## 2021-12-18 DIAGNOSIS — M810 Age-related osteoporosis without current pathological fracture: Secondary | ICD-10-CM

## 2021-12-18 LAB — POCT GLYCOSYLATED HEMOGLOBIN (HGB A1C): Hemoglobin A1C: 6.7 % — AB (ref 4.0–5.6)

## 2021-12-18 MED ORDER — FAMOTIDINE 40 MG PO TABS: 40.0000 mg | ORAL_TABLET | Freq: Every day | ORAL | 1 refills | Status: DC

## 2021-12-18 MED ORDER — ROSUVASTATIN CALCIUM 5 MG PO TABS: 5.0000 mg | ORAL_TABLET | Freq: Every day | ORAL | 3 refills | Status: AC

## 2021-12-18 MED ORDER — SENNA 8.6 MG PO TABS: 1.0000 | ORAL_TABLET | Freq: Every day | ORAL | 0 refills | Status: DC

## 2021-12-18 MED ORDER — HYDROCHLOROTHIAZIDE 25 MG PO TABS: 25.0000 mg | ORAL_TABLET | Freq: Every day | ORAL | 1 refills | Status: DC

## 2021-12-18 MED ORDER — TOPIRAMATE 25 MG PO TABS: 25.0000 mg | ORAL_TABLET | Freq: Two times a day (BID) | ORAL | 3 refills | Status: DC

## 2021-12-18 MED ORDER — AMLODIPINE BESYLATE 10 MG PO TABS: 10.0000 mg | ORAL_TABLET | Freq: Every day | ORAL | 2 refills | Status: AC

## 2021-12-18 NOTE — Patient Instructions (Addendum)
It was a pleasure to see you today!  We will get some labs today.  If they are abnormal or we need to do something about them, I will call you.  If they are normal, I will send you a message on MyChart (if it is active) or a letter in the mail.  If you don't hear from [place] in 2 weeks, please call the office  [phone]. Keep up the good work with your blood pressure! For your headache, it is worth it to try a medicine to prevent your headache. I recommend trying a low dose of topamax 25 mg once at night before bed. If this does not work, we can try a higher dose.  Follow up for your headache if no improvement   Be Well,  Dr. Leary Roca

## 2021-12-18 NOTE — Progress Notes (Signed)
SUBJECTIVE:   Chief compliant/HPI: annual examination  Pamela [name] is a 71 y.o. who presents today for an annual exam.   HA: patient reports that she has a HA that happens in the wee hours of the morning. It wakes her up 3-5 nights per week. She takes tylenol when this happens and it usually goes away about 75% of the time, but often she has trouble going back to sleep. She finds the sleep disruption very frustrating. She describes the headache as central to the top of her head, dull, throbbing ache. This has been occurring for a few months. No new weaknesses or deficits. 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 Cyndy Freeze, neurosurgery. She saw him a few weeks ago and had a recent MRI brain in November last year. She reports that her neurosurgeon does not believe this headache is related to the choleastoma.   HTN: BP at goal today, current regimen includes: amlodipine 10 mg, HCTZ 25 mg. Will check metabolic panel. Asymptomatic.  History tabs reviewed and updated.   Review of systems form reviewed and notable for headache.   OBJECTIVE:   BP 120/70    Pulse 88    Ht _0  (1.651 m)    Wt 105 lb 3.2 oz (47.7 kg)    SpO2 99%    BMI 17.51 kg/m   Nursing note and vitals reviewed GEN: appears younger than stated age, AAW, resting comfortably in chair, NAD, thin-appearing HEENT: NCAT. PERRLA. Sclera without injection or icterus. MMM. Clear oropharynx Neck: Supple. No LAD. Cardiac: Regular rate and rhythm. Normal S1/S2. No murmurs, rubs, or gallops appreciated. 2+ radial pulses. Lungs: Clear bilaterally to ascultation. No increased WOB, no accessory muscle usage. No w/r/r. Neuro: AOx3  Cranial Nerves II: Visual Fields are full. PERRL.  III,IV, VI: EOMI without ptosis or diplopia.  V: Facial sensation is asymmetric to touch, mildly diminshed at Left V2,V3 VII: Facial movement is symmetric.  VIII: Hearing is intact to voice X: Palate elevates  symmetrically XI: Shoulder shrug is symmetric. XII: Tongue is midline without atrophy or fasciculations.  Motor: Tone is normal. Bulk is normal. 5/5 strength was present in all four extremities.  Sensory: Sensation is symmetric to light touch in the arms and legs. Deep Tendon Reflexes: 2+ and symmetric in the biceps and patellae.  Ext: no edema Psych: Pleasant and appropriate   ASSESSMENT/PLAN:   HYPERTENSION, BENIGN SYSTEMIC Continue current regimen ad BP is controlled, electrolytes appropriate on CMP.  Hx of diabetes mellitus A1c today 6.7%. Last 6.4%. Patient has a history of DM2, but stopped medication as her BGs were controlled. Agree that patient can continue with dietary management only at this time.  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 No change in neuro exam per patient, she reports that she has had a change in sensation at Left V2, V3 for 4-5 years. 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 Discussed options for treatment. Recommend trial of topiramate 25 mg for ppx at this time, can titrate this up. Follow up in 2-4 weeks. Discussed return precautions.    Annual Examination  See AVS for age appropriate recommendations  PHQ score 0, reviewed and discussed.  BP reviewed and at goal .  Asked about intimate partner violence and resources given as appropriate  Advance directives discussion- recommended she pick up packet  to discuss with her daughter  Considered the following items based upon USPSTF recommendations: Diabetes screening: discussed, ordered Screening for elevated cholesterol: discussed and ordered HIV testing:  low risk, declined after counseling Hepatitis C: discussed and not ordered, low risk, screen negative in 2021 Hepatitis B: discussed and not ordered due to low risk Syphilis if at high risk: discussed and not ordered as low  risk GC/CT not at high risk and not ordered. Osteoporosis screening considered based upon risk of fracture from [facility] calculator.  DEXA ordered.  Reviewed risk factors for latent tuberculosis and not indicated   Discussed family history, BRCA testing not indicated.   Cervical cancer screening:  not indicated as out of screening age, history of normal pap smears. Breast cancer screening: discussed potential benefits, risks including overdiagnosis and biopsy, elected proceed with mammogram Colorectal cancer screening: up to date on screening for CRC. Lung cancer screening:  not indicated . See documentation below regarding indications/risks/benefits.  Vaccinations Counseled on considering 4th COVID booster as well as Zoster vaccine.   Follow up in 1 year or sooner if indicated.    Gladys Damme, MD [address]

## 2021-12-19 LAB — COMPREHENSIVE METABOLIC PANEL
ALT: 13 IU/L (ref 0–32)
AST: 19 IU/L (ref 0–40)
Albumin/Globulin Ratio: 1.4 (ref 1.2–2.2)
Albumin: 4.6 g/dL (ref 3.8–4.8)
Alkaline Phosphatase: 133 IU/L — ABNORMAL HIGH (ref 44–121)
BUN/Creatinine Ratio: 13 (ref 12–28)
BUN: 11 mg/dL (ref 8–27)
Bilirubin Total: 0.9 mg/dL (ref 0.0–1.2)
CO2: 25 mmol/L (ref 20–29)
Calcium: 10.1 mg/dL (ref 8.7–10.3)
Chloride: 98 mmol/L (ref 96–106)
Creatinine, Ser: 0.87 mg/dL (ref 0.57–1.00)
Globulin, Total: 3.4 g/dL (ref 1.5–4.5)
Glucose: 146 mg/dL — ABNORMAL HIGH (ref 70–99)
Potassium: 3.7 mmol/L (ref 3.5–5.2)
Sodium: 140 mmol/L (ref 134–144)
Total Protein: 8 g/dL (ref 6.0–8.5)
eGFR: 72 mL/min/{1.73_m2} (ref >59)

## 2021-12-19 LAB — LDL CHOLESTEROL, DIRECT: LDL Direct: 104 mg/dL — ABNORMAL HIGH (ref 0–99)

## 2021-12-22 NOTE — Assessment & Plan Note (Signed)
A1c today 6.7%. Last 6.4%. Patient has a history of DM2, but stopped medication as her BGs were controlled. Agree that patient can continue with dietary management only at this time.

## 2021-12-22 NOTE — Assessment & Plan Note (Signed)
Continue current regimen ad BP is controlled, electrolytes appropriate on CMP.

## 2021-12-22 NOTE — Assessment & Plan Note (Signed)
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 No change in neuro exam per patient, she reports that she has had a change in sensation at Left V2, V3 for 4-5 years. 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 Discussed options for treatment. Recommend trial of topiramate 25 mg for ppx at this time, can titrate this up. Follow up in 2-4 weeks. Discussed return precautions.

## 2021-12-25 ENCOUNTER — Other Ambulatory Visit (INDEPENDENT_AMBULATORY_CARE_PROVIDER_SITE_OTHER): Payer: Medicare HMO

## 2021-12-25 DIAGNOSIS — R748 Abnormal levels of other serum enzymes: Secondary | ICD-10-CM | POA: Diagnosis not present

## 2021-12-25 LAB — HEPATIC FUNCTION PANEL
ALT: 14 U/L (ref 0–35)
AST: 17 U/L (ref 0–37)
Albumin: 4.5 g/dL (ref 3.5–5.2)
Alkaline Phosphatase: 106 U/L (ref 39–117)
Bilirubin, Direct: 0.2 mg/dL (ref 0.0–0.3)
Total Bilirubin: 0.9 mg/dL (ref 0.2–1.2)
Total Protein: 8.3 g/dL (ref 6.0–8.3)

## 2021-12-26 ENCOUNTER — Other Ambulatory Visit: Payer: Self-pay | Admitting: Family Medicine

## 2021-12-26 DIAGNOSIS — R519 Headache, unspecified: Secondary | ICD-10-CM

## 2021-12-30 DIAGNOSIS — M810 Age-related osteoporosis without current pathological fracture: Secondary | ICD-10-CM | POA: Diagnosis not present

## 2021-12-30 DIAGNOSIS — K5900 Constipation, unspecified: Secondary | ICD-10-CM | POA: Diagnosis not present

## 2021-12-30 DIAGNOSIS — I10 Essential (primary) hypertension: Secondary | ICD-10-CM | POA: Diagnosis not present

## 2021-12-30 DIAGNOSIS — Z7982 Long term (current) use of aspirin: Secondary | ICD-10-CM | POA: Diagnosis not present

## 2021-12-30 DIAGNOSIS — E785 Hyperlipidemia, unspecified: Secondary | ICD-10-CM | POA: Diagnosis not present

## 2021-12-30 DIAGNOSIS — K219 Gastro-esophageal reflux disease without esophagitis: Secondary | ICD-10-CM | POA: Diagnosis not present

## 2021-12-30 DIAGNOSIS — Z7722 Contact with and (suspected) exposure to environmental tobacco smoke (acute) (chronic): Secondary | ICD-10-CM | POA: Diagnosis not present

## 2021-12-30 DIAGNOSIS — G4700 Insomnia, unspecified: Secondary | ICD-10-CM | POA: Diagnosis not present

## 2021-12-30 DIAGNOSIS — G43909 Migraine, unspecified, not intractable, without status migrainosus: Secondary | ICD-10-CM | POA: Diagnosis not present

## 2021-12-30 DIAGNOSIS — M1990 Unspecified osteoarthritis, unspecified site: Secondary | ICD-10-CM | POA: Diagnosis not present

## 2021-12-30 DIAGNOSIS — G8929 Other chronic pain: Secondary | ICD-10-CM | POA: Diagnosis not present

## 2021-12-30 DIAGNOSIS — E119 Type 2 diabetes mellitus without complications: Secondary | ICD-10-CM | POA: Diagnosis not present

## 2022-01-07 ENCOUNTER — Other Ambulatory Visit: Payer: Self-pay

## 2022-01-07 DIAGNOSIS — R748 Abnormal levels of other serum enzymes: Secondary | ICD-10-CM

## 2022-01-09 NOTE — Progress Notes (Deleted)
° ° °  SUBJECTIVE:   CHIEF COMPLAINT / HPI: headache  Headache: patient reports ***persistent headache in the wee hours of the morning. She started topiramate about 3 weeks ago and reports ***  PERTINENT  PMH / PSH: ***  OBJECTIVE:   There were no vitals taken for this visit.  ***  ASSESSMENT/PLAN:   No problem-specific Assessment & Plan notes found for this encounter.     Shirlean Mylar, MD [facility]

## 2022-01-10 ENCOUNTER — Ambulatory Visit: Payer: Medicare HMO | Admitting: Family Medicine

## 2022-01-10 ENCOUNTER — Ambulatory Visit: Payer: Medicare HMO

## 2022-01-15 ENCOUNTER — Other Ambulatory Visit: Payer: Self-pay

## 2022-01-15 ENCOUNTER — Ambulatory Visit
Admission: RE | Admit: 2022-01-15 | Discharge: 2022-01-15 | Disposition: A | Discharge status: Home / Self Care | Payer: Medicare HMO | Source: Ambulatory Visit | Attending: Family Medicine | Admitting: Family Medicine

## 2022-01-15 DIAGNOSIS — Z1231 Encounter for screening mammogram for malignant neoplasm of breast: Secondary | ICD-10-CM | POA: Diagnosis not present

## 2022-01-17 ENCOUNTER — Ambulatory Visit: Payer: Medicare HMO | Admitting: Gastroenterology

## 2022-01-23 ENCOUNTER — Other Ambulatory Visit: Payer: Self-pay | Admitting: Family Medicine

## 2022-01-23 DIAGNOSIS — R519 Headache, unspecified: Secondary | ICD-10-CM

## 2022-02-07 ENCOUNTER — Other Ambulatory Visit: Payer: Self-pay | Admitting: Family Medicine

## 2022-02-07 DIAGNOSIS — R519 Headache, unspecified: Secondary | ICD-10-CM

## 2022-02-20 ENCOUNTER — Other Ambulatory Visit (INDEPENDENT_AMBULATORY_CARE_PROVIDER_SITE_OTHER): Payer: Medicare HMO

## 2022-02-20 ENCOUNTER — Encounter: Payer: Self-pay | Admitting: Gastroenterology

## 2022-02-20 ENCOUNTER — Ambulatory Visit: Payer: Medicare HMO | Admitting: Gastroenterology

## 2022-02-20 VITALS — BP 142/70 | HR 83 | Ht 62.0 in | Wt 99.1 lb

## 2022-02-20 DIAGNOSIS — R110 Nausea: Secondary | ICD-10-CM

## 2022-02-20 DIAGNOSIS — R748 Abnormal levels of other serum enzymes: Secondary | ICD-10-CM | POA: Diagnosis not present

## 2022-02-20 DIAGNOSIS — R634 Abnormal weight loss: Secondary | ICD-10-CM

## 2022-02-20 DIAGNOSIS — R6881 Early satiety: Secondary | ICD-10-CM | POA: Diagnosis not present

## 2022-02-20 LAB — HEPATIC FUNCTION PANEL
ALT: 14 U/L (ref 0–35)
AST: 17 U/L (ref 0–37)
Albumin: 4.8 g/dL (ref 3.5–5.2)
Alkaline Phosphatase: 126 U/L — ABNORMAL HIGH (ref 39–117)
Bilirubin, Direct: 0.2 mg/dL (ref 0.0–0.3)
Total Bilirubin: 1.1 mg/dL (ref 0.2–1.2)
Total Protein: 8.5 g/dL — ABNORMAL HIGH (ref 6.0–8.3)

## 2022-02-20 LAB — GAMMA GT: GGT: 79 U/L — ABNORMAL HIGH (ref 7–51)

## 2022-02-20 MED ORDER — METOCLOPRAMIDE HCL 5 MG PO TABS: 5.0000 mg | ORAL_TABLET | Freq: Three times a day (TID) | ORAL | 0 refills | Status: DC

## 2022-02-20 MED ORDER — ONDANSETRON 4 MG PO TBDP: 4.0000 mg | ORAL_TABLET | Freq: Three times a day (TID) | ORAL | 1 refills | Status: DC | PRN

## 2022-02-20 NOTE — Patient Instructions (Addendum)
If you are age 71 or older, your body mass index should be between 23-30. Your Body mass index is 18.13 kg/m?Marland Kitchen If this is out of the aforementioned range listed, please consider follow up with your Primary Care Provider. ? ?If you are age 8 or younger, your body mass index should be between 19-25. Your Body mass index is 18.13 kg/m?Marland Kitchen If this is out of the aformentioned range listed, please consider follow up with your Primary Care Provider.  ? ?________________________________________________________ ? ?The [facility] providers would like to encourage you to use [facility] to communicate with providers for non-urgent requests or questions.  Due to long hold times on the telephone, sending your provider a message by [facility] may be a faster and more efficient way to get a response.  Please allow 48 business hours for a response.  Please remember that this is for non-urgent requests.  ?_______________________________________________________ ? ?You have been scheduled for a CT scan of the abdomen and pelvis at [facility], 1st floor Radiology. You are scheduled on __________________  at _______________________. You should arrive 15 minutes prior to your appointment time for registration.  The solution may taste better if refrigerated, but do NOT add ice or any other liquid to this solution. Shake well before drinking.  ? ?Please follow the written instructions below on the day of your exam:  ? ?1) Do not eat anything after _______________ (4 hours prior to your test)  ? ?2) Drink 1 bottle of contrast @ _________________ (2 hours prior to your exam)  Remember to shake well before drinking and do NOT pour over ice. ?    Drink 1 bottle of contrast @ _________________ (1 hour prior to your exam)  ? ?You may take any medications as prescribed with a small amount of water, if necessary. If you take any of the following medications: METFORMIN, GLUCOPHAGE, GLUCOVANCE, AVANDAMET, RIOMET, FORTAMET, ACTOPLUS MET, JANUMET,  Vonore or METAGLIP, you MAY be asked to HOLD this medication 48 hours AFTER the exam.  ? ?The purpose of you drinking the oral contrast is to aid in the visualization of your intestinal tract. The contrast solution may cause some diarrhea. Depending on your individual set of symptoms, you may also receive an intravenous injection of x-ray contrast/dye. Plan on being at [facility] for 45 minutes or longer, depending on the type of exam you are having performed.  ? ?If you have any questions regarding your exam or if you need to reschedule, you may call [facility] at [phone] between the hours of 8:00 am and 5:00 pm, Monday-Friday.  ? ?___________________________________________________________________ ? ? ?Please go to the lab in the basement of our building to have lab work done as you leave today. Hit "B" for basement when you get on the elevator.  When the doors open the lab is on your left.  We will call you with the results. Thank you. ? ?Increase your Remeron to 2 tablets (16m) daily at bedtime. ? ?We have sent the following medications to your pharmacy for you to pick up at your convenience: ?Zofran 4 mg ODT: Dissolve 1 tablet under your tongue in the morning and then take every 8 hours as needed ?Reglan 5 mg: Take one tablet with meals three times a day ? ?Thank you for entrusting me with your care and for choosing [facility] ?Dr. SCarolina Cellar? ? ? ?

## 2022-02-20 NOTE — Progress Notes (Signed)
? ?HPI :  ?70 year old female here for a follow-up visit, last seen for an endoscopy with me in September 2022. ? ?Recall she was previously seen by Carl Best in August.  She described dysphagia at that time, weight loss, poor appetite.  She also had elevated alkaline phosphatase which led to lab work-up.  EGD done in September 2022, she had a small hiatal hernia, small diverticulum in the lower esophagus, white plaques concerning for possible candidiasis.  Biopsies were taken however and showed no evidence of candidiasis.  She had some nonspecific inflammatory changes of her esophagus but no evidence of EOE.  Her stomach and duodenum were normal.  We actually did treat her with fluconazole wire and biopsies were pending.  At the time she said her dysphagia had improved. ? ?Over time her main complaint at this time appears to be poor appetite and continued weight loss.  Her normal weight is about 120 pounds, she is lost about 5 pounds since her last visit in August and overall down to 99 pounds, about 20 pounds from her baseline.  She simply states she has no appetite and feels full all the time.  She has early satiety and eats very small meals, does not think she is eating enough calories in a typical day.  When asked about her dysphagia, she denies any symptoms in her chest when swallowing, hard to specifically get her to describe what she means by this but sounds like she is referring to early satiety and food sitting in her upper stomach.  She does have occasional nausea but does not vomit.  She has been using some Pepcid as needed for heartburn and states that works fairly well she does not really have any heartburn that bothers her.  She does think some foods bother her much more than others, such as fried and fatty foods. ? ?I see Remeron on her medication list dose at 7.5 mg.  She thinks she has been on this for some time, she thinks she takes it every morning.  Otherwise denies any specific  abdominal pain but just has again the sense of fullness in her upper abdomen that never really goes away. ? ?Otherwise she has had a persistent alkaline phosphatase elevation for a few years since 2021 or so ?Alk phos ranging anywhere from 130s to 160s.  Last checked in January and was normal.  GGT was elevated in the past.  She has had a mildly elevated IgG, ANA, and smooth muscle antibody previously.  Antimitochondrial antibody has been negative.  Ultrasound in 2021 for this issue was normal. ? ? ?Prior work-up: ?EGD 08/13/21: ?Esophagogastric landmarks identified. ?- 1 cm hiatal hernia. ?- Diverticulum in the lower third of the esophagus. ?- Esophageal plaques were found, suspicious for candidiasis. ?- Perhaps mild trachealization but no dominant stricture - biopsies taken to rule out EoE. ?- Normal stomach. ?- Normal duodenal bulb and second portion of the duodenum. ? ?Colonoscopy by Dr. Deatra Ina 01/03/2014 which showed diverticulosis in the ascending, transverse, descending and sigmoid colon.  No polyps.   ? ?Diagnosis ?Surgical [P], random esophagus ?- REACTIVE SQUAMOUS EPITHELIUM WITH MARKED ACUTE AND CHRONIC INFLAMMATION ?- SEE COMMENT ?Microscopic Comment ?PAS negative for fungal infection ? ?IgG 1592, ANA (+) 1:40, SMA 25 ? ?Past Medical History:  ?Diagnosis Date  ? Abdominal pain, left lower quadrant 07/18/2015  ? Left Lower Quadrant Abdominal Pain. Patient with hysterectomy in the past. No vaginal bleeding/pain. No complaints of dysuria or incontinence. UA not significant.  No constipation or hematochezia. Colonoscopy in 2015 significant for diverticulosis. CT abdomen in 2016 without hernias. No significant weight loss  - Likely diverticulitis - Will start patient Flagyl 100 mg TID and Ciprofloxacin 100 mg   ? Anemia   ? Anxiety   ? passing of husband  ? Diabetes mellitus without complication ([place])   ? metformin  ? Diverticulosis of colon   ? GERD (gastroesophageal reflux disease)   ? Hypercholesteremia   ?  Hypertension   ? ? ? ?Past Surgical History:  ?Procedure Laterality Date  ? PARTIAL HYSTERECTOMY    ? ?Family History  ?Problem Relation Age of Onset  ? Heart disease Father   ? Diabetes Sister   ? Hypertension Sister   ? Esophageal cancer Sister   ? Diabetes Brother   ? Heart disease Brother   ? Hyperlipidemia Brother   ? Hypertension Brother   ? Colon cancer Neg Hx   ? Stomach cancer Neg Hx   ? Pancreatic cancer Neg Hx   ? Colon polyps Neg Hx   ? ?Social History  ? ?Tobacco Use  ? Smoking status: Never  ? Smokeless tobacco: Former  ?  Types: Snuff  ?  Quit date: 12/08/1968  ?Vaping Use  ? Vaping Use: Never used  ?Substance Use Topics  ? Alcohol use: Yes  ?  Comment: occasional red wine, 3 glass/week with dinner   ? Drug use: Never  ? ?Current Outpatient Medications  ?Medication Sig Dispense Refill  ? acetaminophen (TYLENOL) 500 MG tablet Take 1,000 mg by mouth every 6 (six) hours as needed.    ? amLODipine (NORVASC) 10 MG tablet Take 1 tablet (10 mg total) by mouth at bedtime. 90 tablet 2  ? aspirin EC 81 MG tablet Take 81 mg by mouth daily.    ? Blood Pressure Monitoring (BLOOD PRESSURE CUFF) MISC 1 Device by Does not apply route daily. 1 each 0  ? carvedilol (COREG) 3.125 MG tablet TAKE 1 TABLET BY MOUTH TWICE A DAY WITH MEALS 180 tablet 0  ? famotidine (PEPCID) 40 MG tablet Take 1 tablet (40 mg total) by mouth daily. 90 tablet 1  ? fenofibrate (TRICOR) 48 MG tablet Take 1 tablet (48 mg total) by mouth daily. 90 tablet 1  ? hydrochlorothiazide (HYDRODIURIL) 25 MG tablet Take 1 tablet (25 mg total) by mouth daily. 90 tablet 1  ? mirtazapine (REMERON) 7.5 MG tablet TAKE 1 TABLET BY MOUTH AT BEDTIME. 90 tablet 1  ? nystatin (MYCOSTATIN) 100000 UNIT/ML suspension Take 5 mLs (500,000 Units total) by mouth 4 (four) times daily. Apply 47m to each cheek 473 mL 0  ? rosuvastatin (CRESTOR) 5 MG tablet Take 1 tablet (5 mg total) by mouth daily. 90 tablet 3  ? topiramate (TOPAMAX) 25 MG tablet TAKE 1 TABLET BY MOUTH TWICE A  DAY 180 tablet 1  ? senna (SENOKOT) 8.6 MG TABS tablet Take 1 tablet (8.6 mg total) by mouth daily. (Patient not taking: Reported on 02/20/2022) 120 tablet 0  ? VITAMIN E COMPLEX PO Take 1 tablet by mouth daily. (Patient not taking: Reported on 12/18/2021)    ? ?No current facility-administered medications for this visit.  ? ?Allergies  ?Allergen Reactions  ? Lisinopril Swelling  ?  Angioedema without airway obstruction  ? ? ? ?Review of Systems: ?All systems reviewed and negative except where noted in HPI.  ? ?Labs reviewed in Epic ? ?Physical Exam: ?BP (!) 142/70   Pulse 83   Ht _0  (1.575  m)   Wt 99 lb 1.6 oz (45 kg)   BMI 18.13 kg/m?  ?Constitutional: Pleasant,well-developed, female in no acute distress. ?Abdominal: Soft, nondistended, nontender. There are no masses palpable.  ?Extremities: no edema ?Neurological: Alert and oriented to person place and time. ?Skin: Skin is warm and dry. No rashes noted. ?Psychiatric: Normal mood and affect. Behavior is normal. ? ? ?ASSESSMENT AND PLAN: ?71 year old female here for reassessment of the following: ? ?Loss of weight ?Early satiety ?Nausea ?Abnormal liver enzymes -elevated alkaline phosphatase ? ?EGD without any concerning pathology.  It does not appear that she is having truly dysphagia based on symptoms as she reports today.  Main symptoms are poor appetite, early satiety with upper abdominal discomfort, nausea associated with loss of weight of 20 pounds over the past year or 2.  Discussed differential diagnosis.  It is possible she has a functional disorder of her upper tract such as gastroparesis, we discussed this possibility.  That being said with her weight loss, I think imaging of her abdomen is necessary to ensure her pancreas looks okay, no malignancy.  Recommending CT scan abdomen pelvis with contrast, she is agreeable to this.  In the interim I think reasonable to increase her Remeron to 15 mg and have her take it nightly.  We will also have her take  Zofran PRN to reduce her nausea, and we will empirically give her some Reglan 5 mg p.o. 3 times daily before meals for a few week trial to see if that will help her.  I discussed risks of Reglan with her to incl

## 2022-02-24 ENCOUNTER — Other Ambulatory Visit: Payer: Self-pay

## 2022-02-24 ENCOUNTER — Observation Stay (HOSPITAL_COMMUNITY)
Admission: EM | Admit: 2022-02-24 | Discharge: 2022-02-25 | Disposition: A | Discharge status: Home Health | Payer: Medicare HMO | Attending: Family Medicine | Admitting: Family Medicine

## 2022-02-24 ENCOUNTER — Encounter (HOSPITAL_COMMUNITY): Payer: Self-pay | Admitting: Emergency Medicine

## 2022-02-24 ENCOUNTER — Emergency Department (HOSPITAL_COMMUNITY): Payer: Medicare HMO

## 2022-02-24 DIAGNOSIS — Z79899 Other long term (current) drug therapy: Secondary | ICD-10-CM | POA: Diagnosis not present

## 2022-02-24 DIAGNOSIS — K2970 Gastritis, unspecified, without bleeding: Secondary | ICD-10-CM | POA: Diagnosis not present

## 2022-02-24 DIAGNOSIS — I129 Hypertensive chronic kidney disease with stage 1 through stage 4 chronic kidney disease, or unspecified chronic kidney disease: Secondary | ICD-10-CM | POA: Diagnosis not present

## 2022-02-24 DIAGNOSIS — E1122 Type 2 diabetes mellitus with diabetic chronic kidney disease: Secondary | ICD-10-CM | POA: Diagnosis not present

## 2022-02-24 DIAGNOSIS — N183 Chronic kidney disease, stage 3 unspecified: Secondary | ICD-10-CM | POA: Insufficient documentation

## 2022-02-24 DIAGNOSIS — Z20822 Contact with and (suspected) exposure to covid-19: Secondary | ICD-10-CM | POA: Diagnosis not present

## 2022-02-24 DIAGNOSIS — E876 Hypokalemia: Secondary | ICD-10-CM | POA: Diagnosis not present

## 2022-02-24 DIAGNOSIS — R2689 Other abnormalities of gait and mobility: Secondary | ICD-10-CM | POA: Insufficient documentation

## 2022-02-24 DIAGNOSIS — N3289 Other specified disorders of bladder: Secondary | ICD-10-CM | POA: Diagnosis not present

## 2022-02-24 DIAGNOSIS — R112 Nausea with vomiting, unspecified: Secondary | ICD-10-CM | POA: Diagnosis not present

## 2022-02-24 DIAGNOSIS — K5730 Diverticulosis of large intestine without perforation or abscess without bleeding: Secondary | ICD-10-CM | POA: Diagnosis not present

## 2022-02-24 LAB — COMPREHENSIVE METABOLIC PANEL
ALT: 18 U/L (ref 0–44)
AST: 20 U/L (ref 15–41)
Albumin: 4.2 g/dL (ref 3.5–5.0)
Alkaline Phosphatase: 106 U/L (ref 38–126)
Anion gap: 13 (ref 5–15)
BUN: 10 mg/dL (ref 8–23)
CO2: 28 mmol/L (ref 22–32)
Calcium: 9.8 mg/dL (ref 8.9–10.3)
Chloride: 95 mmol/L — ABNORMAL LOW (ref 98–111)
Creatinine, Ser: 0.94 mg/dL (ref 0.44–1.00)
GFR, Estimated: >60 mL/min (ref >60)
Glucose, Bld: 194 mg/dL — ABNORMAL HIGH (ref 70–99)
Potassium: 2.7 mmol/L — CL (ref 3.5–5.1)
Sodium: 136 mmol/L (ref 135–145)
Total Bilirubin: 1.7 mg/dL — ABNORMAL HIGH (ref 0.3–1.2)
Total Protein: 8.1 g/dL (ref 6.5–8.1)

## 2022-02-24 LAB — CBC
HCT: 36.6 % (ref 36.0–46.0)
Hemoglobin: 12.7 g/dL (ref 12.0–15.0)
MCH: 35.3 pg — ABNORMAL HIGH (ref 26.0–34.0)
MCHC: 34.7 g/dL (ref 30.0–36.0)
MCV: 101.7 fL — ABNORMAL HIGH (ref 80.0–100.0)
Platelets: 243 10*3/uL (ref 150–400)
RBC: 3.6 MIL/uL — ABNORMAL LOW (ref 3.87–5.11)
RDW: 11.9 % (ref 11.5–15.5)
WBC: 10.2 10*3/uL (ref 4.0–10.5)
nRBC: 0 % (ref 0.0–0.2)

## 2022-02-24 LAB — BASIC METABOLIC PANEL
Anion gap: 10 (ref 5–15)
BUN: 8 mg/dL (ref 8–23)
CO2: 25 mmol/L (ref 22–32)
Calcium: 9 mg/dL (ref 8.9–10.3)
Chloride: 99 mmol/L (ref 98–111)
Creatinine, Ser: 0.76 mg/dL (ref 0.44–1.00)
GFR, Estimated: >60 mL/min (ref >60)
Glucose, Bld: 137 mg/dL — ABNORMAL HIGH (ref 70–99)
Potassium: 2.9 mmol/L — ABNORMAL LOW (ref 3.5–5.1)
Sodium: 134 mmol/L — ABNORMAL LOW (ref 135–145)

## 2022-02-24 LAB — URINALYSIS, ROUTINE W REFLEX MICROSCOPIC
Bilirubin Urine: NEGATIVE
Glucose, UA: NEGATIVE mg/dL
Hgb urine dipstick: NEGATIVE
Ketones, ur: NEGATIVE mg/dL
Leukocytes,Ua: NEGATIVE
Nitrite: NEGATIVE
Protein, ur: NEGATIVE mg/dL
Specific Gravity, Urine: 1.014 (ref 1.005–1.030)
pH: 8 (ref 5.0–8.0)

## 2022-02-24 LAB — MAGNESIUM: Magnesium: 2 mg/dL (ref 1.7–2.4)

## 2022-02-24 LAB — RESP PANEL BY RT-PCR (FLU A&B, COVID) ARPGX2
Influenza A by PCR: NEGATIVE
Influenza B by PCR: NEGATIVE
SARS Coronavirus 2 by RT PCR: NEGATIVE

## 2022-02-24 LAB — LIPASE, BLOOD: Lipase: 40 U/L (ref 11–51)

## 2022-02-24 LAB — HIV ANTIBODY (ROUTINE TESTING W REFLEX): HIV Screen 4th Generation wRfx: NONREACTIVE

## 2022-02-24 MED ORDER — TOPIRAMATE 25 MG PO TABS
25.0000 mg | ORAL_TABLET | Freq: Two times a day (BID) | ORAL | Status: DC
  Administered 2022-02-24: 25 mg via ORAL
  Filled 2022-02-24 (×2): qty 1

## 2022-02-24 MED ORDER — MELATONIN 5 MG PO TABS
5.0000 mg | ORAL_TABLET | Freq: Every day | ORAL | Status: DC
  Administered 2022-02-24: 5 mg via ORAL
  Filled 2022-02-24: qty 1

## 2022-02-24 MED ORDER — POTASSIUM CHLORIDE 10 MEQ/100ML IV SOLN
10.0000 meq | INTRAVENOUS | Status: AC
  Administered 2022-02-24 (×3): 10 meq via INTRAVENOUS
  Filled 2022-02-24 (×3): qty 100

## 2022-02-24 MED ORDER — IOHEXOL 300 MG/ML  SOLN
100.0000 mL | Freq: Once | INTRAMUSCULAR | Status: AC | PRN
  Administered 2022-02-24: 100 mL via INTRAVENOUS

## 2022-02-24 MED ORDER — FAMOTIDINE IN NACL 20-0.9 MG/50ML-% IV SOLN
20.0000 mg | Freq: Once | INTRAVENOUS | Status: AC
  Administered 2022-02-24: 20 mg via INTRAVENOUS
  Filled 2022-02-24: qty 50

## 2022-02-24 MED ORDER — PANTOPRAZOLE SODIUM 40 MG IV SOLR
40.0000 mg | Freq: Two times a day (BID) | INTRAVENOUS | Status: DC
Start: 2022-02-24 — End: 2022-02-25
  Administered 2022-02-24 – 2022-02-25 (×2): 40 mg via INTRAVENOUS
  Filled 2022-02-24 (×2): qty 10

## 2022-02-24 MED ORDER — POTASSIUM CHLORIDE 10 MEQ/100ML IV SOLN
10.0000 meq | INTRAVENOUS | Status: AC
  Administered 2022-02-24 – 2022-02-25 (×3): 10 meq via INTRAVENOUS
  Filled 2022-02-24 (×3): qty 100

## 2022-02-24 MED ORDER — AMLODIPINE BESYLATE 10 MG PO TABS
10.0000 mg | ORAL_TABLET | Freq: Every day | ORAL | Status: DC
  Administered 2022-02-24: 10 mg via ORAL
  Filled 2022-02-24: qty 1

## 2022-02-24 MED ORDER — POTASSIUM CHLORIDE 20 MEQ PO PACK
40.0000 meq | PACK | Freq: Once | ORAL | Status: AC
Start: 2022-02-24 — End: 2022-02-24
  Administered 2022-02-24: 40 meq via ORAL
  Filled 2022-02-24: qty 2

## 2022-02-24 MED ORDER — FAMOTIDINE 20 MG PO TABS
40.0000 mg | ORAL_TABLET | Freq: Every day | ORAL | Status: DC
  Administered 2022-02-24 – 2022-02-25 (×2): 40 mg via ORAL
  Filled 2022-02-24 (×3): qty 2

## 2022-02-24 MED ORDER — ACETAMINOPHEN 325 MG PO TABS
650.0000 mg | ORAL_TABLET | Freq: Four times a day (QID) | ORAL | Status: DC | PRN
  Administered 2022-02-24 – 2022-02-25 (×3): 650 mg via ORAL
  Filled 2022-02-24 (×3): qty 2

## 2022-02-24 MED ORDER — FENTANYL CITRATE PF 50 MCG/ML IJ SOSY
50.0000 ug | PREFILLED_SYRINGE | Freq: Once | INTRAMUSCULAR | Status: AC
  Administered 2022-02-24: 50 ug via INTRAVENOUS
  Filled 2022-02-24: qty 1

## 2022-02-24 MED ORDER — LACTATED RINGERS IV SOLN: INTRAVENOUS | Status: DC

## 2022-02-24 MED ORDER — ENOXAPARIN SODIUM 30 MG/0.3ML IJ SOSY
30.0000 mg | PREFILLED_SYRINGE | INTRAMUSCULAR | Status: DC
  Administered 2022-02-24: 30 mg via SUBCUTANEOUS
  Filled 2022-02-24: qty 0.3

## 2022-02-24 MED ORDER — ROSUVASTATIN CALCIUM 5 MG PO TABS
5.0000 mg | ORAL_TABLET | Freq: Every day | ORAL | Status: DC
  Administered 2022-02-24 – 2022-02-25 (×2): 5 mg via ORAL
  Filled 2022-02-24 (×2): qty 1

## 2022-02-24 MED ORDER — ENSURE ENLIVE PO LIQD
237.0000 mL | Freq: Two times a day (BID) | ORAL | Status: DC
  Administered 2022-02-24 – 2022-02-25 (×2): 237 mL via ORAL

## 2022-02-24 MED ORDER — POLYETHYLENE GLYCOL 3350 17 G PO PACK
17.0000 g | PACK | Freq: Every day | ORAL | Status: DC
  Administered 2022-02-24 – 2022-02-25 (×2): 17 g via ORAL
  Filled 2022-02-24 (×2): qty 1

## 2022-02-24 MED ORDER — LIDOCAINE 5 % EX PTCH
1.0000 | MEDICATED_PATCH | CUTANEOUS | Status: DC
  Administered 2022-02-24: 1 via TRANSDERMAL
  Filled 2022-02-24: qty 1

## 2022-02-24 MED ORDER — MAGNESIUM SULFATE 2 GM/50ML IV SOLN
2.0000 g | Freq: Once | INTRAVENOUS | Status: AC
  Administered 2022-02-24: 2 g via INTRAVENOUS
  Filled 2022-02-24: qty 50

## 2022-02-24 NOTE — H&P (Addendum)
Family Medicine Teaching Service ?Hospital Admission History and Physical ?Service Pager: [phone] ? ?Patient name: Pamela [name] Medical record number: [id number] ?Date of birth: 15-Jun-1951 Age: 71 y.o. Gender: female ? ?Primary Care Provider: Eulis Foster, MD ?Consultants: None ?Code Status: Full  ?Preferred Emergency Contact: Pamela [name] [phone] ? ?Chief Complaint: Right-sided back pain, poor PO intake ? ?Assessment and Plan: ?Pamela [name] is a 71 y.o. female presenting with right-sided abdominal pain. PMH is significant for T2DM, CKD, GERD, hypertension, hyperlipidemia, anemia, s/p hysterectomy. ? ?Right-sided lumbar pain likely secondary to muscle strain ?Poor PO intake  Constipation  Gastritis ?Patient reports right sided abdominal/back pain that awoke her from sleep last night so she came to ED for further evaluation. S/p Fentanyl x1 in ED. Pain out of proportion to exam. Patient winces in pain on arrival to the room, but talks throughout history without any sign of pain. Most likely lumbar muscle strain given its worsening with movement and the tenderness to palpation. No concern for pyelonephritis given no urinary symptoms and UA without sign of infection. Patient also reports decreased appetite and abdominal pain, ongoing x7 years per chart review. CT scan today shows wall thickening versus under distention of the gastric antrum, potentially gastritis and pancolonic diverticulosis similar to prior scan in 2016.  Labs today largely unremarkable-UA and lipase within normal limits, CBC without leukocytosis, renal function normal. K+ low at 2.7. S/p IV K+ x3 runs, Mg, Pepcid in ED. Of note: Recently seen by GI outpatient on 3/16 in which she was having current symptoms. Patient has not been taking medications as prescribed at that visit (Remeron, Reglan). Workup thus far negative (EGD, colonoscopy, labs) except for pancolonic diverticulosis. Consulted for admission d/t hypokalemia and  inability to tolerate PO challenge. Will admit for observation, IVF. ?- Admit to FPTS, attending Dr. Chrisandra Netters ?- Monitor vital signs ?- Follow-up outpatient with GI ?- Continue famotidine 40mg  daily ?- protonix 40mg  BID  ?- Lidocaine patch ?- K pad ?- Strict Is and Os ?- Tylenol q6h PRN pain ?- EKG STAT, can start Zofran if no qt prolongation ?- protonix 40mg  BID ?- AM BMP ?- Ensure TID ?- RD consult ?- PT/OT eval and treat ? ?Hypokalemia ?K+ 2.7 on admission. Likely secondary to malnutrition. S/p 2 IV runs K+. ?- F/u BMP at 1800 ?- am labs ? ?Hypertension ?BP hypertensive on arrival to 150s-160s/60s-70s. Now normotensive. Will hold HCTZ given hypokalemia. ?- Continue home Amlodipine 10mg  daily ?- Hold home HCTZ ?- Monitor VS ? ?Hyperlipidemia ?Chronic. Takes Rosuvastatin 5mg  daily ?- Continue home meds ? ?Type 2 diabetes mellitus, diet-controlled ?Last A1c *%. Not taking any medications, diet=controlled. ?- Monitor with BMP ? ?FEN/GI: Regular ?Prophylaxis: Lovenox ? ?Disposition: Med-surg ? ?History of Present Illness:  Pamela [name] is a 71 y.o. female presenting with right sided back pain. She has also had ongoing abdominal pain and says when she eats food she feels like it "just sits there." It is worse with laying down at night. Started about a year ago but was able to eat more last year. States she hasnt been able to get much sleep. Endorses right sided pain that started last night. Denies having this pain prior. Denies falling or trauma. Denies dysuria or issues with urinating. Denies nausea, vomiting. Does endorse constipation, last BM Friday night. Usually goes once a day. Uses milk of magnesia sometimes. States she eats a lot of vegetables. Does try to drink ensures since she has been losing weight. Denies CP, dizziness,  lightheadedness.  ? ?Does take something for acid reflux. Denies taking Advil states she was told by the GI doctor to avoid it. States she hasn't taken Tylenol  ? ?Tobacco use:  none. Alcohol : wine on occasion, denies rec drug use  ? ?Lives with brother and helps take care of him.  ? ?Review Of Systems: Per HPI with the following additions:  ? ?Review of Systems  ?Constitutional:  Negative for chills, diaphoresis, fatigue and fever.  ?HENT:  Negative for rhinorrhea, sinus pressure and sinus pain.   ?Respiratory:  Negative for cough, shortness of breath and wheezing.   ?Gastrointestinal:  Positive for abdominal pain, constipation and nausea. Negative for blood in stool, diarrhea and vomiting.  ?Genitourinary:  Negative for frequency, hematuria, pelvic pain and urgency.  ?Musculoskeletal:  Negative for neck pain.  ?Neurological:  Negative for dizziness, syncope, weakness, light-headedness and headaches.   ? ?Patient Active Problem List  ? Diagnosis Date Noted  ? Ecchymoses, spontaneous 10/04/2021  ? Globus sensation 07/01/2021  ? Oral thrush 07/01/2021  ? Hyponatremia 05/30/2021  ? Hypokalemia 04/02/2021  ? 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 Controlled diabetes mellitus type 2 with complications ([place]) [id number]  ? Nocturnal muscle cramp 07/17/2020  ? Hearing loss 06/07/2020  ? Pain aggravated by eating or drinking 04/30/2020  ? Hypercalcemia 03/05/2020  ? Restless leg 07/27/2019  ? Cramp of both lower extremities 06/11/2018  ? Hx of diabetes mellitus 03/09/2018  ? Sleep disorder 10/27/2017  ? Osteoporosis 08/12/2017  ? Decreased appetite 10/02/2015  ? Diverticulosis 05/10/2015  ? GERD (gastroesophageal reflux disease) 01/15/2015  ? Headache 07/25/2014  ? CKD (chronic kidney disease) stage 3, GFR 30-59 ml/min (HCC) 09/18/2013  ? Insomnia 03/07/2013  ? Perimenopausal atrophic vaginitis 01/27/2012  ? Constipation 12/27/2010  ? Weight loss 05/24/2010  ? HLD (hyperlipidemia) 02/04/2007  ? HYPERTENSION, BENIGN SYSTEMIC 02/04/2007  ? ? ?Past Medical History: ?Past Medical History:  ?Diagnosis Date  ? Anemia   ? Anxiety   ? passing of husband  ? Diabetes mellitus without complication ([place])   ?  metformin  ? Diverticulosis of colon   ? GERD (gastroesophageal reflux disease)   ? Hypercholesteremia   ? Hypertension   ? ? ?Past Surgical History: ?Past Surgical History:  ?Procedure Laterality Date  ? PARTIAL HYSTERECTOMY    ? ? ?Social History: ?Social History  ? ?Tobacco Use  ? Smoking status: Never  ? Smokeless tobacco: Former  ?  Types: Snuff  ?  Quit date: 12/08/1968  ?Vaping Use  ? Vaping Use: Never used  ?Substance Use Topics  ? Alcohol use: Yes  ?  Comment: occasional red wine, 3 glass/week with dinner   ? Drug use: Never  ? ?Family History: ?Family History  ?Problem Relation Age of Onset  ? Heart disease Father   ? Diabetes Sister   ? Hypertension Sister   ? Esophageal cancer Sister   ? Diabetes Brother   ? Heart disease Brother   ? Hyperlipidemia Brother   ? Hypertension Brother   ? Colon cancer Neg Hx   ? Stomach cancer Neg Hx   ? Pancreatic cancer Neg Hx   ? Colon polyps Neg Hx   ? ?Allergies and Medications: ?Allergies  ?Allergen Reactions  ? Lisinopril Swelling  ?  Angioedema without airway obstruction  ? ?No current facility-administered medications on file prior to encounter.  ? ?Current Outpatient Medications on File Prior to Encounter  ?Medication Sig Dispense Refill  ? amLODipine (NORVASC) 10 MG tablet Take 1  tablet (10 mg total) by mouth at bedtime. 90 tablet 2  ? Blood Pressure Monitoring (BLOOD PRESSURE CUFF) MISC 1 Device by Does not apply route daily. 1 each 0  ? carvedilol (COREG) 3.125 MG tablet TAKE 1 TABLET BY MOUTH TWICE A DAY WITH MEALS (Patient taking differently: Take 3.125 mg by mouth 2 (two) times daily.) 180 tablet 0  ? famotidine (PEPCID) 40 MG tablet Take 1 tablet (40 mg total) by mouth daily. 90 tablet 1  ? fenofibrate (TRICOR) 48 MG tablet Take 1 tablet (48 mg total) by mouth daily. 90 tablet 1  ? hydrochlorothiazide (HYDRODIURIL) 25 MG tablet Take 1 tablet (25 mg total) by mouth daily. 90 tablet 1  ? metoCLOPramide (REGLAN) 5 MG tablet Take 1 tablet (5 mg total) by mouth 3  (three) times daily before meals. 90 tablet 0  ? mirtazapine (REMERON) 7.5 MG tablet TAKE 1 TABLET BY MOUTH AT BEDTIME. (Patient taking differently: Take 7.5 mg by mouth at bedtime.) 90 tablet 1  ? nystatin (MYC

## 2022-02-24 NOTE — ED Provider Notes (Signed)
?[facility] ?Provider Note ? ? ?CSN: [id number] ?Arrival date & time: 02/24/22  0831 ? ?  ? ?History ? ?Chief Complaint  ?Patient presents with  ? Abdominal Pain  ? ? ?Pamela [name] is a 71 y.o. female. ? ? ?Abdominal Pain ? ?Patient is a 71 year old female presenting with history of diabetes, CKD, GERD, hypertension, hyper lipidemia, anemia, status post hysterectomy and prior C-sections presenting today with right-sided abdominal pain.  It started last night, the pain is constant has been progressively worsening.  It feels like a sharp stabbing pain that does not radiate elsewhere.  It is associated with nausea but no vomiting, denies any diarrhea but has been constipated at home.  Nothing makes it better, movement makes it worse ? ?Home Medications ?Prior to Admission medications   ?Medication Sig Start Date End Date Taking? Authorizing Provider  ?amLODipine (NORVASC) 10 MG tablet Take 1 tablet (10 mg total) by mouth at bedtime. 12/18/21   Gladys Damme, MD  ?Blood Pressure Monitoring (BLOOD PRESSURE CUFF) MISC 1 Device by Does not apply route daily. 10/14/18   Caroline More, DO  ?carvedilol (COREG) 3.125 MG tablet TAKE 1 TABLET BY MOUTH TWICE A DAY WITH MEALS ?Patient taking differently: Take 3.125 mg by mouth 2 (two) times daily. 12/03/21   Donney Dice, DO  ?famotidine (PEPCID) 40 MG tablet Take 1 tablet (40 mg total) by mouth daily. 12/18/21   Gladys Damme, MD  ?fenofibrate (TRICOR) 48 MG tablet Take 1 tablet (48 mg total) by mouth daily. 03/13/21   Simmons-Robinson, Riki Sheer, MD  ?hydrochlorothiazide (HYDRODIURIL) 25 MG tablet Take 1 tablet (25 mg total) by mouth daily. 12/18/21   Gladys Damme, MD  ?metoCLOPramide (REGLAN) 5 MG tablet Take 1 tablet (5 mg total) by mouth 3 (three) times daily before meals. 02/20/22   Armbruster, Carlota Raspberry, MD  ?mirtazapine (REMERON) 7.5 MG tablet TAKE 1 TABLET BY MOUTH AT BEDTIME. ?Patient taking differently: Take 7.5 mg by mouth at  bedtime. 06/13/21   Simmons-Robinson, Riki Sheer, MD  ?nystatin (MYCOSTATIN) 100000 UNIT/ML suspension Take 5 mLs (500,000 Units total) by mouth 4 (four) times daily. Apply 29mL to each cheek 08/14/21   Simmons-Robinson, Riki Sheer, MD  ?ondansetron (ZOFRAN-ODT) 4 MG disintegrating tablet Take 1 tablet (4 mg total) by mouth every 8 (eight) hours as needed for nausea or vomiting. 02/20/22   Armbruster, Carlota Raspberry, MD  ?rosuvastatin (CRESTOR) 5 MG tablet Take 1 tablet (5 mg total) by mouth daily. 12/18/21   Gladys Damme, MD  ?senna (SENOKOT) 8.6 MG TABS tablet Take 1 tablet (8.6 mg total) by mouth daily. ?Patient not taking: Reported on 02/20/2022 12/18/21   Gladys Damme, MD  ?topiramate (TOPAMAX) 25 MG tablet TAKE 1 TABLET BY MOUTH TWICE A DAY 02/07/22   Gladys Damme, MD  ?traZODone (DESYREL) 50 MG tablet TAKE 0.5-1 TABLETS (25-50 MG TOTAL) BY MOUTH AT BEDTIME AS NEEDED FOR SLEEP. 03/27/20 05/21/21  Caroline More, DO  ?   ? ?Allergies    ?Lisinopril   ? ?Review of Systems   ?Review of Systems  ?Gastrointestinal:  Positive for abdominal pain.  ? ?Physical Exam ?Updated Vital Signs ?BP (!) 161/75   Pulse 84   Temp 98.1 ?F (36.7 ?C) (Oral)   Resp (!) 23   Ht 5\' 2"  (1.575 m)   Wt 46.3 kg   SpO2 100%   BMI 18.66 kg/m?  ?Physical Exam ?Vitals and nursing note reviewed. Exam conducted with a chaperone present.  ?Constitutional:   ?  General: She is not in acute distress. ?   Appearance: Normal appearance.  ?   Comments: Chronically ill, cachectic   ?HENT:  ?   Head: Normocephalic and atraumatic.  ?Eyes:  ?   General: No scleral icterus. ?   Extraocular Movements: Extraocular movements intact.  ?   Pupils: Pupils are equal, round, and reactive to light.  ?Skin: ?   Coloration: Skin is not jaundiced.  ?Neurological:  ?   Mental Status: She is alert. Mental status is at baseline.  ?   Coordination: Coordination normal.  ? ? ?ED Results / Procedures / Treatments   ?Labs ?(all labs ordered are listed, but only abnormal results  are displayed) ?Labs Reviewed  ?COMPREHENSIVE METABOLIC PANEL - Abnormal; Notable for the following components:  ?    Result Value  ? Potassium 2.7 (*)   ? Chloride 95 (*)   ? Glucose, Bld 194 (*)   ? Total Bilirubin 1.7 (*)   ? All other components within normal limits  ?CBC - Abnormal; Notable for the following components:  ? RBC 3.60 (*)   ? MCV 101.7 (*)   ? MCH 35.3 (*)   ? All other components within normal limits  ?URINALYSIS, ROUTINE W REFLEX MICROSCOPIC - Abnormal; Notable for the following components:  ? APPearance CLOUDY (*)   ? All other components within normal limits  ?RESP PANEL BY RT-PCR (FLU A&B, COVID) [id number]  ?LIPASE, BLOOD  ?MAGNESIUM  ? ? ?EKG ?None ? ?Radiology ?CT Abdomen Pelvis W Contrast ? ?Result Date: 02/24/2022 ?CLINICAL DATA:  Right-sided abdominal pain with nausea and loss of appetite. EXAM: CT ABDOMEN AND PELVIS WITH CONTRAST TECHNIQUE: Multidetector CT imaging of the abdomen and pelvis was performed using the standard protocol following bolus administration of intravenous contrast. RADIATION DOSE REDUCTION: This exam was performed according to the departmental dose-optimization program which includes automated exposure control, adjustment of the mA and/or kV according to patient size and/or use of iterative reconstruction technique. CONTRAST:  141mL OMNIPAQUE IOHEXOL 300 MG/ML  SOLN COMPARISON:  May 30, 2015 CT and ultrasound August 08, 2020 FINDINGS: Lower chest: No acute abnormality. Hepatobiliary: No suspicious hepatic lesion. Gallbladder is unremarkable. No biliary ductal dilation. Pancreas: Mild prominence of the pancreatic duct is similar dating back to May 30, 2015. No evidence of acute pancreatic inflammation. Spleen: No splenomegaly or focal splenic lesion. Adrenals/Urinary Tract: Bilateral adrenal glands appear normal. No hydronephrosis. Kidneys demonstrate symmetric enhancement excretion of contrast. Urinary bladder is unremarkable for degree of distension.  Stomach/Bowel: No enteric contrast was administered. Wall thickening versus underdistention of the gastric antrum. No pathologic dilation of small or large bowel. The appendix appears normal. Pancolonic diverticulosis with some mild sigmoid colonic wall thickening, similar in appearance to CT May 30, 2015 and favored to reflect sequela chronic inflammation ( Segmental colitis associated with diverticulosis). Vascular/Lymphatic: Aortic and branch vessel atherosclerosis without abdominal aortic aneurysm. No pathologically enlarged abdominal or pelvic lymph nodes. Reproductive: Status post hysterectomy. No adnexal masses. Other: No significant abdominopelvic free fluid. Musculoskeletal: Mild multilevel degenerative changes spine. No acute osseous finding. IMPRESSION: 1. Wall thickening versus underdistention of the gastric antrum. Correlate for gastritis. 2. Pancolonic diverticulosis with some mild sigmoid colonic wall thickening, similar in appearance to CT May 30, 2015 and favored to reflect sequela chronic inflammation ( Segmental colitis associated with diverticulosis). 3.  Aortic Atherosclerosis (ICD10-I70.0). Electronically Signed   By: Dahlia Bailiff M.D.   On: 02/24/2022 10:46   ? ?Procedures ?Marland KitchenCritical Care ?Performed by: Lia Hopping,  Hildred Alamin, PA-C ?Authorized by: Sherrill Raring, PA-C  ? ?Critical care provider statement:  ?  Critical care time (minutes):  30 ?  Critical care start time:  02/24/2022 11:30 AM ?  Critical care end time:  02/24/2022 12:00 PM ?  Critical care time was exclusive of:  Separately billable procedures and treating other patients and teaching time ?  Critical care was necessary to treat or prevent imminent or life-threatening deterioration of the following conditions:  Metabolic crisis ?  Critical care was time spent personally by me on the following activities:  Development of treatment plan with patient or surrogate, discussions with consultants, evaluation of patient's response to treatment,  examination of patient, ordering and review of laboratory studies, ordering and review of radiographic studies, ordering and performing treatments and interventions, pulse oximetry, re-evaluation of patient's condition and re

## 2022-02-24 NOTE — ED Notes (Signed)
Admitting team at bedside.

## 2022-02-24 NOTE — Progress Notes (Signed)
FPTS Brief Progress Note ? ?S: Patient is resting comfortably when I go by the room.  Spoke with the nurse taking care of her who reported no major concerns at this time. ? ? ?O: ?BP (!) 152/68   Pulse 72   Temp 98.9 ?F (37.2 ?C) (Oral)   Resp 14   Ht 5\' 2"  (1.575 m)   Wt 44.8 kg   SpO2 100%   BMI 18.07 kg/m?   ? ? ?A/P: ?Continue current plan per day team, currently receiving potassium ?- Orders reviewed. Labs for AM ordered, which was adjusted as needed.  ? ?Gifford Shave, MD ?02/24/2022, 9:19 PM ?PGY-3, [facility] Night Resident  ?Please page [phone] with questions.  ? ?

## 2022-02-24 NOTE — Progress Notes (Signed)
PT Cancellation Note ? ?Patient Details ?Name: Pamela [name] ?MRN: [id number] ?DOB: 1951/09/06 ? ? ?Cancelled Treatment:    Reason Eval/Treat Not Completed: Medical issues which prohibited therapy.  Newly arrived from ED and awaiting K+ replenishing.  Follow up at another time. ? ? ?Ivar Drape ?02/24/2022, 3:52 PM ? ?Samul Dada, PT PhD ?Acute Rehab Dept. Number: [facility] [phone] and [phone] ? ?

## 2022-02-24 NOTE — Progress Notes (Signed)
NEW ADMISSION NOTE ?New Admission Note:  ? ?Arrival Method: stretcher ?Mental Orientation: A&OX4 ?Telemetry:none ?Assessment: Completed ?Skin:intact ?IV: RAC ?Pain:10/10 ?Tubes:none ?Safety Measures: Safety Fall Prevention Plan has been given, discussed and signed ?Admission: Completed ?[facility] Orientation: Patient has been orientated to the room, unit and staff.  ?Family: daughter at bedside ? ?Orders have been reviewed and implemented. Will continue to monitor the patient. Call light has been placed within reach and bed alarm has been activated.  ? ?Emerie Vanderkolk S Shanavia Makela, RN   ?

## 2022-02-24 NOTE — ED Triage Notes (Signed)
Patient here with complaint sharp right abdominal pain that started last night and has gotten more intense overnight. Patient denies emesis and diarrhea, denies changes in urination. Patient is alert, oriented, and ambulatory. ?

## 2022-02-24 NOTE — ED Notes (Signed)
Got patient into a gown on the monitor patient is resting with call bell in reach 

## 2022-02-25 DIAGNOSIS — E876 Hypokalemia: Secondary | ICD-10-CM | POA: Diagnosis not present

## 2022-02-25 LAB — HEPATIC FUNCTION PANEL
ALT: 19 U/L (ref 0–44)
AST: 32 U/L (ref 15–41)
Albumin: 3.1 g/dL — ABNORMAL LOW (ref 3.5–5.0)
Alkaline Phosphatase: 92 U/L (ref 38–126)
Bilirubin, Direct: 0.3 mg/dL — ABNORMAL HIGH (ref 0.0–0.2)
Indirect Bilirubin: 1.3 mg/dL — ABNORMAL HIGH (ref 0.3–0.9)
Total Bilirubin: 1.6 mg/dL — ABNORMAL HIGH (ref 0.3–1.2)
Total Protein: 6.3 g/dL — ABNORMAL LOW (ref 6.5–8.1)

## 2022-02-25 LAB — CBC
HCT: 30.2 % — ABNORMAL LOW (ref 36.0–46.0)
Hemoglobin: 10.5 g/dL — ABNORMAL LOW (ref 12.0–15.0)
MCH: 35.6 pg — ABNORMAL HIGH (ref 26.0–34.0)
MCHC: 34.8 g/dL (ref 30.0–36.0)
MCV: 102.4 fL — ABNORMAL HIGH (ref 80.0–100.0)
Platelets: 179 10*3/uL (ref 150–400)
RBC: 2.95 MIL/uL — ABNORMAL LOW (ref 3.87–5.11)
RDW: 12.1 % (ref 11.5–15.5)
WBC: 5.8 10*3/uL (ref 4.0–10.5)
nRBC: 0 % (ref 0.0–0.2)

## 2022-02-25 LAB — BASIC METABOLIC PANEL
Anion gap: 7 (ref 5–15)
BUN: 8 mg/dL (ref 8–23)
CO2: 25 mmol/L (ref 22–32)
Calcium: 8.9 mg/dL (ref 8.9–10.3)
Chloride: 104 mmol/L (ref 98–111)
Creatinine, Ser: 0.83 mg/dL (ref 0.44–1.00)
GFR, Estimated: >60 mL/min (ref >60)
Glucose, Bld: 192 mg/dL — ABNORMAL HIGH (ref 70–99)
Potassium: 3.9 mmol/L (ref 3.5–5.1)
Sodium: 136 mmol/L (ref 135–145)

## 2022-02-25 MED ORDER — MIRTAZAPINE 15 MG PO TABS: 15.0000 mg | ORAL_TABLET | Freq: Every day | ORAL | 0 refills | Status: DC

## 2022-02-25 NOTE — Discharge Summary (Signed)
Family Medicine Teaching Service ?Hospital Discharge Summary ? ?Patient name: Pamela [name] Medical record number: [id number] ?Date of birth: [birth year] Age: 71 y.o. Gender: female ?Date of Admission: 02/24/2022  Date of Discharge: 02/25/2022 ?Admitting Physician: Orvis Brill, DO ? ?Primary Care Provider: Eulis Foster, MD ?Consultants: None ? ?Indication for Hospitalization: Right sided pain, poor PO intake ? ?Discharge Diagnoses/Problem List:  ?Principal Problem: ?  Gastritis ? ?Disposition: Home ? ?Discharge Condition: Stable ? ?Discharge Exam:  ?General: Thin, elderly female in bed smiling in no distress ?CV: RRR ?Resp: Lungs clear in all fields ?MSK: Mild tenderness to palpation over right quadratus lumborum with taut muscle compared to left ?Abdomen: Thin, soft, non-distended, non-tender, normal active bowel sounds in all quadrants. No hepatosplenomegaly. ?Ext: Warm, dry. ? ?Brief Hospital Course:  ?Pamela [name] is a 71 year-old female who presented with right-sided back pain. PMH significant for T2DM, CKD, GERD, hypertension, hyperlipidemia, anemia, s/p hysterectomy.Marland Kitchen Hospital course is outlined below: ? ?Right-sided lumbar pain likely 2/2 muscle strain ?Patient reports right sided abdominal/back pain that awoke her from sleep. S/p Fentanyl x1 in ED. Pain out of proportion to exam. Most likely lumbar muscle strain given its worsening with movement and the tenderness to palpation. No concern for pyelonephritis given no urinary symptoms and UA without sign of infection. Patient also reports decreased appetite and abdominal pain, ongoing x7 years per chart review. CT scan showed wall thickening versus under distention of the gastric antrum, potentially gastritis and pancolonic diverticulosis similar to prior scan in 2016.  Labs today largely unremarkable-UA and lipase within normal limits, CBC without leukocytosis, renal function normal. Pain significantly improved with lidocaine patch and K  pad. ? ?Hypokalemia ?K+ low at 2.7. Repleted with IV and PO potassium K+ 3.9 at time of discharge. ? ?Mild hyperbilirubinemia ?Total bilirubin 1.6 (indirect 1.3, direct 0.3). AST/ALT within normal limits. Chronic alk phos elevation. Discussed with GI who recommended outpatient follow up for potential MRCP or liver biopsy, per Dr. Perry Mount office visit on 3/16. ? ?PCP follow-up items: ?Ensure taking medications as prescribed. Remeron filled at discharge for appetite stimulation. ?Follow-up on GI recommendations ?Repeat BMP (hypokalemia) ? ? ? ? ? ?Significant Labs and Imaging:  ?Recent Labs  ?Lab 02/24/22 ?0998 02/25/22 ?3382  ?WBC 10.2 5.8  ?HGB 12.7 10.5*  ?HCT 36.6 30.2*  ?PLT 243 179  ? ?Recent Labs  ?Lab 02/20/22 ?1446 02/24/22 ?5053 02/24/22 ?9767 02/24/22 ?1643 02/25/22 ?3419  ?NA  --  136  --  134* 136  ?K  --  2.7*   < > 2.9* 3.9  ?CL  --  95*  --  99 104  ?CO2  --  28  --  25 25  ?GLUCOSE  --  194*  --  137* 192*  ?BUN  --  10  --  8 8  ?CREATININE  --  0.94  --  0.76 0.83  ?CALCIUM  --  9.8  --  9.0 8.9  ?MG  --  2.0  --   --   --   ?ALKPHOS 126* 106  --   --  92  ?AST 17 20  --   --  32  ?ALT 14 18  --   --  19  ?ALBUMIN 4.8 4.2  --   --  3.1*  ? < > = values in this interval not displayed.  ? ? ?Discharge Medications:  ?Allergies as of 02/25/2022   ? ?   Reactions  ? Lisinopril Swelling  ? Angioedema without  airway obstruction  ? Nsaids   ? Stomach issues  ? ?  ? ?  ?Medication List  ?  ? ?STOP taking these medications   ? ?carvedilol 3.125 MG tablet ?Commonly known as: COREG ?  ?fenofibrate 48 MG tablet ?Commonly known as: Tricor ?  ?hydrochlorothiazide 25 MG tablet ?Commonly known as: HYDRODIURIL ?  ?nystatin 100000 UNIT/ML suspension ?Commonly known as: MYCOSTATIN ?  ?topiramate 25 MG tablet ?Commonly known as: TOPAMAX ?  ? ?  ? ?TAKE these medications   ? ?amLODipine 10 MG tablet ?Commonly known as: NORVASC ?Take 1 tablet (10 mg total) by mouth at bedtime. ?  ?Blood Pressure Cuff Misc ?1 Device by  Does not apply route daily. ?  ?famotidine 40 MG tablet ?Commonly known as: PEPCID ?Take 1 tablet (40 mg total) by mouth daily. ?  ?metoCLOPramide 5 MG tablet ?Commonly known as: Reglan ?Take 1 tablet (5 mg total) by mouth 3 (three) times daily before meals. ?  ?mirtazapine 15 MG tablet ?Commonly known as: REMERON ?Take 1 tablet (15 mg total) by mouth at bedtime. ?What changed:  ?medication strength ?how much to take ?  ?omeprazole 20 MG capsule ?Commonly known as: PRILOSEC ?Take 20 mg by mouth daily as needed (acid reflux). ?  ?ondansetron 4 MG disintegrating tablet ?Commonly known as: ZOFRAN-ODT ?Take 1 tablet (4 mg total) by mouth every 8 (eight) hours as needed for nausea or vomiting. ?  ?rosuvastatin 5 MG tablet ?Commonly known as: CRESTOR ?Take 1 tablet (5 mg total) by mouth daily. ?  ?senna 8.6 MG Tabs tablet ?Commonly known as: SENOKOT ?Take 1 tablet (8.6 mg total) by mouth daily. ?  ? ?  ? ? ?Discharge Instructions: Please refer to Patient Instructions section of EMR for full details.  Patient was counseled important signs and symptoms that should prompt return to medical care, changes in medications, dietary instructions, activity restrictions, and follow up appointments.  ? ?Follow-Up Appointments: ? Follow-up Information   ? ? Noralyn Pick, NP. Go on 03/05/2022.   ?Specialty: Gastroenterology ?Contact information: ?[address] ?[place] [address] ?[phone] ? ? ?  ?  ? ? [facility] Follow up.   ?Specialty: Home Health Services ?Why: Someone will call you to schedule first home visit. ?Contact information: ?[address] ?[address] ?[place] [address] ?[phone] ? ? ?  ?  ? ?  ?  ? ?  ? ? ?Orvis Brill, DO ?02/25/2022, 4:54 PM ?PGY-1, [place] ? ?

## 2022-02-25 NOTE — Discharge Instructions (Addendum)
Dear Pamela [name],  ? ?Thank you for letting us participate in your care! In this section, you will find a brief hospital admission summary of why you were admitted to the hospital, what happened during your admission, your diagnosis/diagnoses, and recommended follow up.  ?You were admitted because you were experiencing back pain.  ?Your testing revealed gastritis (inflammation of the stomach) and hypokalemia (low potassium levels).  ?You were treated with with intravenous fluids and potassium replacement. ?Your condition improved and you were discharged from the hospital for meeting this goal.  ? ? ?POST-HOSPITAL & CARE INSTRUCTIONS ?Please follow-up with [facility] at your appointment listed below. ?Please take your medications as prescribed. ?Make sure to continue with your Ensure drinks and eating bananas to keep potassium levels normal. Drink water to stay hydrated. ?Please let PCP/Specialists know of any changes in medications that were made.  ?Please see medications section of this packet for any medication changes.  ? ?DOCTOR'S APPOINTMENTS & FOLLOW UP ? ? ?Thank you for choosing [facility]! Take care and be well! ? ?Family Medicine Teaching Service Inpatient Team ?[facility]  ?[facility]  ?[address] [place], [place] [address] ?[phone] ? ?

## 2022-02-25 NOTE — TOC Transition Note (Signed)
Transition of Care (TOC) - CM/SW Discharge Note ? ? ?Patient Details  ?Name: Pamela [name] ?MRN: [id number] ?Date of Birth: 1951/09/07 ? ?Transition of Care (TOC) CM/SW Contact:  ?Tom-Johnson, Hershal Coria, RN ?Phone Number: ?02/25/2022, 3:24 PM ? ? ?Clinical Narrative:    ? ?Patient is scheduled for discharge today. Admitted for Gastritis. From home with brother. Has three supportive children. Patient states she had never been employed as her husband took care of her. 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 Gets her income from her husband's SS benefit. ?Home health recommended and CM gave patient a list of agencies and she chose [facility]. Information on AVS.  ?Brother to transport at discharge. No further TOC needs noted. ? ?Final next level of care: Home w Home Health Services ?Barriers to Discharge: Barriers Resolved ? ? ?Patient Goals and CMS Choice ?Patient states their goals for this hospitalization and ongoing recovery are:: To return home ?CMS Medicare.gov Compare Post Acute Care list provided to:: Patient ?Choice offered to / list presented to : Patient ? ?Discharge Placement ?  ?           ?  ?  ?  ?  ? ?Discharge Plan and Services ?  ?  ?           ?DME Arranged: N/A ?DME Agency: NA ?  ?  ?  ?HH Arranged: PT ?HH Agency: [facility] ?Date HH Agency Contacted: 02/25/22 ?Time HH Agency Contacted: 1355 ?Representative spoke with at [facility] Agency: Monia Pouch ? ?Social Determinants of Health (SDOH) Interventions ?  ? ? ?Readmission Risk Interventions ?No flowsheet data found. ? ? ? ? ?

## 2022-02-25 NOTE — Evaluation (Signed)
Physical Therapy Evaluation ?Patient Details ?Name: Pamela [name] ?MRN: [id number] ?DOB: 08/11/1951 ?Today's Date: 02/25/2022 ? ?History of Present Illness ? 71 y.o. female whom presented with right-sided abdominal pain on 3/20.  She was dx w/ gastritis and was given K+ secondary to hypokalemia. She lives with her brother whom has a caretaker for bathing and cleaning but assists him with cooking.  PMHx:  T2DM, CKD, GERD, hypertension, hyperlipidemia, anemia, s/p hysterectomy.  ?Clinical Impression ? Pt was seen for evaluation of mobility and to assess source of her back pain on R hip.   Pt was instructed on gentle stretches to hip but avoided back ROM due to fractures with no brace.  Pt is subacute for the injuries but in consideration of her chronic fractures will let PCP follow up with her on the movement of spine.  Pt is expecting home therapy for follow up due to her pain on spine and general discomfort plaguing her gait.  Follow along with her for acute PT as ordered until dc. ?   ? ?Recommendations for follow up therapy are one component of a multi-disciplinary discharge planning process, led by the attending physician.  Recommendations may be updated based on patient status, additional functional criteria and insurance authorization. ? ?Follow Up Recommendations Home health PT ? ?  ?Assistance Recommended at Discharge Set up Supervision/Assistance  ?Patient can return home with the following ? A little help with walking and/or transfers;A little help with bathing/dressing/bathroom;Assistance with cooking/housework;Assist for transportation;Help with stairs or ramp for entrance ? ?  ?Equipment Recommendations None recommended by PT  ?Recommendations for Other Services ?    ?  ?Functional Status Assessment Patient has had a recent decline in their functional status and demonstrates the ability to make significant improvements in function in a reasonable and predictable amount of time.  ? ?  ?Precautions /  Restrictions Precautions ?Precautions: Fall ?Precaution Comments: R hip pain ?Restrictions ?Weight Bearing Restrictions: No ?Other Position/Activity Restrictions: painful to flex into R hip to sit  ? ?  ? ?Mobility ? Bed Mobility ?Overal bed mobility: Modified Independent ?  ?  ?  ?  ?  ?  ?  ?  ? ?Transfers ?Overall transfer level: Modified independent ?Equipment used: None ?  ?  ?  ?  ?  ?  ?  ?  ?  ? ?Ambulation/Gait ?Ambulation/Gait assistance: Min guard ?Gait Distance (Feet): 150 Feet ?Assistive device: 1 person hand held assist ?Gait Pattern/deviations: Step-through pattern, Decreased stride length, Decreased weight shift to right ?Gait velocity: reduced ?Gait velocity interpretation: <1.31 ft/sec, indicative of household ambulator ?Pre-gait activities: standing balance check ?General Gait Details: pt is painful to WB on RLE but not as much to stretch R hip in her room ? ?Stairs ?  ?  ?  ?  ?  ? ?Wheelchair Mobility ?  ? ?Modified Rankin (Stroke Patients Only) ?  ? ?  ? ?Balance Overall balance assessment: Needs assistance (avoiding RLE but not such that she is a high fall risk) ?Sitting-balance support: Feet supported ?Sitting balance-Leahy Scale: Good ?  ?  ?Standing balance support: No upper extremity supported, During functional activity ?Standing balance-Leahy Scale: Fair ?  ?  ?  ?  ?  ?  ?  ?  ?  ?  ?  ?  ?   ? ? ? ?Pertinent Vitals/Pain Pain Assessment ?Pain Assessment: Faces ?Faces Pain Scale: Hurts little more ?Breathing: normal ?Pain Location: R hip around quadratus and glutes ?Pain Descriptors / Indicators:  Aching, Tightness ?Pain Intervention(s): Monitored during session, Repositioned, Heat applied, Utilized relaxation techniques  ? ? ?Home Living Family/patient expects to be discharged to:: Private residence ?Living Arrangements: Children;Other relatives ?Available Help at Discharge: Family;Friend(s);Available PRN/intermittently ?Type of Home: House ?Home Access: Stairs to enter ?Entrance  Stairs-Rails: Right;Left;Can reach both ?Entrance Stairs-Number of Steps: 8-10 (2 STE house from the back) ?  ?Home Layout: One level ?Home Equipment: Shower seat ?Additional Comments: was not usign an AD previously even with hip pain  ?  ?Prior Function Prior Level of Function : Independent/Modified Independent ?  ?  ?  ?  ?  ?  ?Mobility Comments: gait with no AD, no recent falls ?  ?  ? ? ?Hand Dominance  ? Dominant Hand: Right ? ?  ?Extremity/Trunk Assessment  ? Upper Extremity Assessment ?Upper Extremity Assessment: Defer to OT evaluation ?  ? ?Lower Extremity Assessment ?Lower Extremity Assessment: Overall WFL for tasks assessed ?  ? ?Cervical / Trunk Assessment ?Cervical / Trunk Assessment: Other exceptions (chroniic spinal compression fractures)  ?Communication  ? Communication: No difficulties  ?Cognition Arousal/Alertness: Awake/alert ?Behavior During Therapy: [facility] for tasks assessed/performed ?Overall Cognitive Status: Within Functional Limits for tasks assessed ?  ?  ?  ?  ?  ?  ?  ?  ?  ?  ?  ?  ?  ?  ?  ?  ?  ?  ?  ? ?  ?General Comments General comments (skin integrity, edema, etc.): pt is able to walk with PT with better pain management when she is supported on R hand, but declines to try walker or SPC ? ?  ?Exercises Other Exercises ?Other Exercises: instructed stretches to R hip in standing to extend and rotate then in sitting to flex ?Other Exercises: avoided spine due to her fractures  ? ?Assessment/Plan  ?  ?PT Assessment Patient needs continued PT services  ?PT Problem List Decreased activity tolerance;Decreased mobility;Pain ? ?   ?  ?PT Treatment Interventions DME instruction;Gait training;Functional mobility training;Therapeutic activities;Therapeutic exercise;Balance training;Stair training;Neuromuscular re-education;Patient/family education   ? ?PT Goals (Current goals can be found in the Care Plan section)  ?Acute Rehab PT Goals ?Patient Stated Goal: to get rid of her pain ?PT Goal  Formulation: With patient ?Time For Goal Achievement: 03/04/22 ?Potential to Achieve Goals: Good ? ?  ?Frequency Min 3X/week ?  ? ? ?Co-evaluation   ?  ?  ?  ?  ? ? ?  ?AM-PAC PT "6 Clicks" Mobility  ?Outcome Measure Help needed turning from your back to your side while in a flat bed without using bedrails?: None ?Help needed moving from lying on your back to sitting on the side of a flat bed without using bedrails?: None ?Help needed moving to and from a bed to a chair (including a wheelchair)?: A Little ?Help needed standing up from a chair using your arms (e.g., wheelchair or bedside chair)?: A Little ?Help needed to walk in hospital room?: A Little ?Help needed climbing 3-5 steps with a railing? : A Little ?6 Click Score: 20 ? ?  ?End of Session Equipment Utilized During Treatment: Gait belt ?Activity Tolerance: Patient tolerated treatment well;Treatment limited secondary to medical complications (Comment);Patient limited by pain ?Patient left: in chair;with call bell/phone within reach;with chair alarm set;Other (comment) (heating pad on chair) ?Nurse Communication: Mobility status ?PT Visit Diagnosis: Pain;Other abnormalities of gait and mobility (R26.89) ?Pain - Right/Left: Right ?Pain - part of body: Hip ?  ? ?Time: 3009-2330 ?PT Time  Calculation (min) (ACUTE ONLY): 18 min ? ? ?Charges:   PT Evaluation ?$PT Eval Moderate Complexity: 1 Mod ?  ?  ?   ? ?Ivar Drape ?02/25/2022, 2:46 PM ? ?Samul Dada, PT PhD ?Acute Rehab Dept. Number: [facility] [phone] and [facility] [phone] ? ? ?

## 2022-02-25 NOTE — Evaluation (Signed)
Occupational Therapy Evaluation ?Patient Details ?Name: Pamela [name] ?MRN: [id number] ?DOB: 07-21-1951 ?Today's Date: 02/25/2022 ? ? ?History of Present Illness [name] is a 71 y.o. female whom presented with right-sided abdominal pain. PMH is significant for T2DM, CKD, GERD, hypertension, hyperlipidemia, anemia, s/p hysterectomy.  She was dx w/ gastritis and was given K+ secondary to hypokalemia. She lives with her brother whom has a caretaker for bathing and cleaning but assists him with cooking.  ? ?Clinical Impression ?  ?Pt admitted as above. She is currently Mod I- independent with functional mobility related to ADL's and ADL's as performed during assessment today. She has shower chair at home that she can borrow from her brother if needed. She denies pain at this time stating that it is greatly improved. She reports h/o difficulty eating secondary to lack of appetite and would like to know if she can be given medication that would assist with increasing her appetite "So I don't come back here". She reports a supportive family and friends that can assist her at d/c and denies any further needs at this time. Will sign off acute OT.  ?   ? ?Recommendations for follow up therapy are one component of a multi-disciplinary discharge planning process, led by the attending physician.  Recommendations may be updated based on patient status, additional functional criteria and insurance authorization.  ? ?Follow Up Recommendations ? No OT follow up  ?  ?Assistance Recommended at Discharge Intermittent Supervision/Assistance  ?Patient can return home with the following Assistance with cooking/housework;Assist for transportation ? ?  ?Functional Status Assessment ? Patient has not had a recent decline in their functional status  ?Equipment Recommendations ? None recommended by OT  ?  ?Recommendations for Other Services PT consult ? ? ?  ?Precautions / Restrictions Precautions ?Precautions: Fall ?Restrictions ?Weight  Bearing Restrictions: No  ? ?  ? ?Mobility Bed Mobility ?Overal bed mobility: Modified Independent ?  ?  ?Patient Response: Cooperative ? ?Transfers ?Overall transfer level: Independent ?Equipment used: None ?  ?  ?General transfer comment: No hands on assist required during assessment in room, tub transfer and/or w/ chair transfers. Pt reports 8-10 STE house from front of house which is her standard way of entering house. 2 STE from rear of house. ?  ? ?  ?Balance Overall balance assessment: Modified Independent, No apparent balance deficits (not formally assessed) ?Sitting-balance support: No upper extremity supported, Feet supported ?Sitting balance-Leahy Scale: Good ?  ?  ?Standing balance support: No upper extremity supported, During functional activity ?Standing balance-Leahy Scale: Good ?   ? ?ADL either performed or assessed with clinical judgement  ? ?ADL Overall ADL's : Modified independent;Independent ?Eating/Feeding: Independent;Sitting ?  ?Grooming: Wash/dry hands;Wash/dry face;Oral care;Independent;Standing ?Grooming Details (indicate cue type and reason): Standing at sink for grooming ?Upper Body Bathing: Modified independent;Sitting ?  ?Lower Body Bathing: Modified independent;Sit to/from stand ?  ?Upper Body Dressing : Modified independent;Sitting ?  ?Lower Body Dressing: Independent;Sit to/from stand;Modified independent ?Lower Body Dressing Details (indicate cue type and reason): Pt is Mod I don/doff socks bilateral feet sitting at EOB ?Toilet Transfer: Modified Independent;Ambulation;Regular Toilet ?Toilet Transfer Details (indicate cue type and reason): No AD, no HHA, no vc's ?Toileting- Clothing Manipulation and Hygiene: Modified independent;Sitting/lateral lean;Sit to/from stand ?  ?Tub/ Shower Transfer: Tub transfer;Modified independent;Ambulation ?Tub/Shower Transfer Details (indicate cue type and reason): Simulated tub transfer in pt room using step in shower and taking larger step up and  over ledge to simulate tub height. No  hands on assist required. Discussed that pt may use shower chair at home PRN (Brother has chair). ?Functional mobility during ADLs: Modified independent;Independent ?General ADL Comments: Pt is currently Mod I- independent with functional mobility and ADL's as performed during assessment today. She has shower chair at home that she can borrow from her brother PRN. She denies pain, reports supportive family and friends that can assist her at d/c and denies any further needs at this time. Will sign off acute OT.  ? ? ? ?Vision Baseline Vision/History: 0 No visual deficits ?Ability to See in Adequate Light: 0 Adequate ?Patient Visual Report: No change from baseline ?Vision Assessment?: No apparent visual deficits  ?   ?   ?   ? ?Pertinent Vitals/Pain Pain Assessment ?Pain Assessment: No/denies pain ?Pain Score: 0-No pain ?Faces Pain Scale: No hurt ?Breathing: normal  ? ? ? ?Hand Dominance Right ?  ?Extremity/Trunk Assessment Upper Extremity Assessment ?Upper Extremity Assessment: Overall WFL for tasks assessed ?  ?Lower Extremity Assessment ?Lower Extremity Assessment: Defer to PT evaluation;Overall [facility] for tasks assessed ?  ?Cervical / Trunk Assessment ?Cervical / Trunk Assessment: Normal ?  ?Communication Communication ?Communication: No difficulties ?  ?Cognition Arousal/Alertness: Awake/alert ?Behavior During Therapy: [facility] for tasks assessed/performed ?Overall Cognitive Status: Within Functional Limits for tasks assessed ?  ?General Comments: Pt is A & O x4 and states that her pain is greatly improved since admit. She currently denies any pain ?  ?  ?General Comments  Pt is wondering if she could be given "medication to help increase my appetite so I don't end up coming back in here". Pt with supportive family/friends able to provide PRN assist at d/c. Has shower chair that she can borrow from brother whom lives with her if needed. Denies any other needs. ? ?  ?   ?   ? ? ?Home  Living Family/patient expects to be discharged to:: Private residence ?Living Arrangements: Children;Other relatives ?Available Help at Discharge: Family;Friend(s);Available PRN/intermittently ?Type of Home: House ?Home Access: Stairs to enter ?Entrance Stairs-Number of Steps: 8-10 (2 STE house from the back) ?Entrance Stairs-Rails: Right;Left;Can reach both ?Home Layout: One level ?  ?  ?Bathroom Shower/Tub: Tub/shower unit ?  ?Bathroom Toilet: Standard ?Bathroom Accessibility: Yes ?  ?Home Equipment: Shower seat ?  ?Additional Comments: Pt states that she can use shower chair PRN at d/c ?  ? ?  ?Prior Functioning/Environment Prior Level of Function : Independent/Modified Independent ?  ?  ?Mobility Comments: Pt is independent with AMB w/o AD, she does not drive ?ADLs Comments: Pt is indepdendent with ADL's and light homemaking, she assists with cooking for her brother. Her brother has a HHA for assist with bathing, cleaning his room etc. ?  ? ?  ?  ?OT Problem List: Pain ?  ?   ?OT Treatment/Interventions:   Assessment only, sign off OT ?  ?OT Goals(Current goals can be found in the care plan section) Acute Rehab OT Goals ?Patient Stated Goal: Go home later today. "Get medication to help increase my appetite" ?OT Goal Formulation: All assessment and education complete, DC therapy ?Time For Goal Achievement: 02/25/22 ?Potential to Achieve Goals: Good  ? ?OT Frequency:   ?Eval only ?  ? ?   ?AM-PAC OT "6 Clicks" Daily Activity     ?Outcome Measure Help from another person eating meals?: None ?Help from another person taking care of personal grooming?: None ?Help from another person toileting, which includes using toliet, bedpan, or urinal?: None ?Help from  another person bathing (including washing, rinsing, drying)?: A Little ?Help from another person to put on and taking off regular upper body clothing?: None ?Help from another person to put on and taking off regular lower body clothing?: None ?6 Click Score: 23 ?   ?End of Session Equipment Utilized During Treatment: Other (comment) (N/A) ?Nurse Communication: Mobility status ? ?Activity Tolerance: Patient tolerated treatment well;No increased pain ?Patient left:

## 2022-02-25 NOTE — Progress Notes (Signed)
DISCHARGE NOTE HOME ?[name] to be discharged Home per MD order. Discussed prescriptions and follow up appointments with the patient. Prescriptions given to patient; medication list explained in detail. Patient verbalized understanding. ? ?Skin clean, dry and intact without evidence of skin break down, no evidence of skin tears noted. IV catheter discontinued intact. Site without signs and symptoms of complications. Dressing and pressure applied. Pt denies pain at the site currently. No complaints noted. ? ?Patient free of lines, drains, and wounds.  ? ?An After Visit Summary (AVS) was printed and given to the patient. ?Patient escorted via wheelchair, and discharged home via private auto. ? ?[name] S Shalom Ware, RN   ?

## 2022-02-25 NOTE — Hospital Course (Addendum)
Pamela [name] is a 71 year-old female who presented with right-sided back pain. PMH significant for T2DM, CKD, GERD, hypertension, hyperlipidemia, anemia, s/p hysterectomy.Marland Kitchen Hospital course is outlined below: ? ?Right-sided lumbar pain likely 2/2 muscle strain ?Patient reports right sided abdominal/back pain that awoke her from sleep. S/p Fentanyl x1 in ED. Pain out of proportion to exam. Most likely lumbar muscle strain given its worsening with movement and the tenderness to palpation. No concern for pyelonephritis given no urinary symptoms and UA without sign of infection. Patient also reports decreased appetite and abdominal pain, ongoing x7 years per chart review. CT scan showed wall thickening versus under distention of the gastric antrum, potentially gastritis and pancolonic diverticulosis similar to prior scan in 2016.  Labs today largely unremarkable-UA and lipase within normal limits, CBC without leukocytosis, renal function normal. Pain significantly improved with lidocaine patch and K pad. ? ?Hypokalemia ?K+ low at 2.7. Repleted with IV and PO potassium K+ 3.9 at time of discharge. ? ?Mild hyperbilirubinemia ?Total bilirubin 1.6 (indirect 1.3, direct 0.3). AST/ALT within normal limits. Chronic alk phos elevation. Discussed with GI who recommended outpatient follow up for potential MRCP or liver biopsy, per Dr. Perry Mount office visit on 3/16. ? ?PCP follow-up items: ?Ensure taking medications as prescribed. Remeron filled at discharge for appetite stimulation. ?Follow-up on GI recommendations ?Repeat BMP (hypokalemia) ?

## 2022-02-25 NOTE — Plan of Care (Signed)

## 2022-02-26 ENCOUNTER — Other Ambulatory Visit: Payer: Self-pay | Admitting: Gastroenterology

## 2022-02-27 ENCOUNTER — Other Ambulatory Visit: Payer: Self-pay | Admitting: Family Medicine

## 2022-02-27 ENCOUNTER — Other Ambulatory Visit: Payer: Self-pay

## 2022-02-27 ENCOUNTER — Ambulatory Visit (INDEPENDENT_AMBULATORY_CARE_PROVIDER_SITE_OTHER): Payer: Medicare HMO | Admitting: Family Medicine

## 2022-02-27 VITALS — BP 127/80 | HR 79 | Ht 62.0 in | Wt 103.2 lb

## 2022-02-27 DIAGNOSIS — R634 Abnormal weight loss: Secondary | ICD-10-CM | POA: Diagnosis not present

## 2022-02-27 DIAGNOSIS — E876 Hypokalemia: Secondary | ICD-10-CM | POA: Diagnosis not present

## 2022-02-27 DIAGNOSIS — G4700 Insomnia, unspecified: Secondary | ICD-10-CM

## 2022-02-27 MED ORDER — RAMELTEON 8 MG PO TABS: 8.0000 mg | ORAL_TABLET | Freq: Every day | ORAL | 0 refills | Status: DC

## 2022-02-27 MED ORDER — RAMELTEON 8 MG PO TABS
8.0000 mg | ORAL_TABLET | Freq: Every day | ORAL | 0 refills | Status: DC
Start: 2022-02-27 — End: 2022-02-27

## 2022-02-27 NOTE — Progress Notes (Signed)
? ? ?  SUBJECTIVE:  ? ?CHIEF COMPLAINT / HPI: hospital f/u, med management  ? ?Patient was hospitalized from 02/24/22 until 02/25/22 for abdominal pain.  ?Patient was experiencing abdominal pain and has had weight loss. She is followed by GI. Her next appt is 03/05/22. ?Since discharge, she reports feeling much better in regards to pain. She has not had problems with nausea since discharge from the hospital. Reviewed new medication changes and medications that she should stop including topamax and ropinole. Patient has meds with her for today's visit.  ? ?Insomnia  ?Patient has tried trazodone with no improvement. She reports that she has also been given anti-anxiety medicine that did not help her to sleep either. She states that she usually sleeps from 10:30-11PM until 4AM and can not go back to sleep. She is unable to take naps during the day. She often falls asleep with the TV on.  ? ?Hypokalemia f/u  ?3.9 at discharge. Patient is agreeable to recheck K today.  ? ?PERTINENT  PMH / PSH:  ?HTN  ?Diverticulosis  ?CKD ?DM ? ?OBJECTIVE:  ? ?BP 127/80   Pulse 79   Ht 5\' 2"  (1.575 m)   Wt 103 lb 3.2 oz (46.8 kg)   SpO2 96%   BMI 18.88 kg/m?   ?Ms. ?General: female appearing stated age in no acute distress ?HEENT: MMM ?Cardio: Normal S1 and S2, no S3 or S4. Rhythm is regular. No murmurs or rubs.  Bilateral radial pulses palpable ?Pulm: Clear to auscultation bilaterally, no crackles, wheezing, or diminished breath sounds. Normal respiratory effort, stable on RA ?Abdomen: Bowel sounds normal. Abdomen soft and non-tender.  ?Extremities: No peripheral edema. Warm/ well perfused.  ? ?ASSESSMENT/PLAN:  ? ?Weight loss ?Patient to continue mirtazepine  ?Encouraged drinking 3 ensure drinks between meals  ?Patient to continue small meals throughout the day ?Will follow up in 2 weeks  ? ?Insomnia ?Discussed sleep hygiene.  ?Patient would like to try different agent  ?Prescribed ramelteon  ? ?Hypokalemia ?Will recheck K today BMP   ?  ? ? ? , MD ?[facility]  ?

## 2022-02-27 NOTE — Assessment & Plan Note (Addendum)
Discussed sleep hygiene.  ?Patient would like to try different agent  ?Prescribed ramelteon  ?

## 2022-02-27 NOTE — Assessment & Plan Note (Signed)
Patient to continue mirtazepine  ?Encouraged drinking 3 ensure drinks between meals  ?Patient to continue small meals throughout the day ?Will follow up in 2 weeks  ?

## 2022-02-27 NOTE — Patient Instructions (Signed)
I have prescribed a medication called ramelteon to help with sleep.  You can try taking this medication at bedtime. ? ?Please follow-up with me in 2 weeks to check on your insomnia as well as your weight. ? ?Please continue to drink Ensure drinks in between meals I recommend increasing to 1-2 more drinks than the 2 that you take now. ? ? ? ?Insomnia ?Insomnia is a sleep disorder that makes it difficult to fall asleep or stay asleep. Insomnia can cause fatigue, low energy, difficulty concentrating, mood swings, and poor performance at work or school. ?There are three different ways to classify insomnia: ?Difficulty falling asleep. ?Difficulty staying asleep. ?Waking up too early in the morning. ?Any type of insomnia can be long-term (chronic) or short-term (acute). Both are common. Short-term insomnia usually lasts for three months or less. Chronic insomnia occurs at least three times a week for longer than three months. ?What are the causes? ?Insomnia may be caused by another condition, situation, or substance, such as: ?Anxiety. ?Certain medicines. ?Gastroesophageal reflux disease (GERD) or other gastrointestinal conditions. ?Asthma or other breathing conditions. ?Restless legs syndrome, sleep apnea, or other sleep disorders. ?Chronic pain. ?Menopause. ?Stroke. ?Abuse of alcohol, tobacco, or illegal drugs. ?Mental health conditions, such as depression. ?Caffeine. ?Neurological disorders, such as Alzheimer's disease. ?An overactive thyroid (hyperthyroidism). ?Sometimes, the cause of insomnia may not be known. ?What increases the risk? ?Risk factors for insomnia include: ?Gender. Women are affected more often than men. ?Age. Insomnia is more common as you get older. ?Stress. ?Lack of exercise. ?Irregular work schedule or working night shifts. ?Traveling between different time zones. ?Certain medical and mental health conditions. ?What are the signs or symptoms? ?If you have insomnia, the main symptom is having  trouble falling asleep or having trouble staying asleep. This may lead to other symptoms, such as: ?Feeling fatigued or having low energy. ?Feeling nervous about going to sleep. ?Not feeling rested in the morning. ?Having trouble concentrating. ?Feeling irritable, anxious, or depressed. ?How is this diagnosed? ?This condition may be diagnosed based on: ?Your symptoms and medical history. Your health care provider may ask about: ?Your sleep habits. ?Any medical conditions you have. ?Your mental health. ?A physical exam. ?How is this treated? ?Treatment for insomnia depends on the cause. Treatment may focus on treating an underlying condition that is causing insomnia. Treatment may also include: ?Medicines to help you sleep. ?Counseling or therapy. ?Lifestyle adjustments to help you sleep better. ?Follow these instructions at home: ?Eating and drinking ? ?Limit or avoid alcohol, caffeinated beverages, and cigarettes, especially close to bedtime. These can disrupt your sleep. ?Do not eat a large meal or eat spicy foods right before bedtime. This can lead to digestive discomfort that can make it hard for you to sleep. ?Sleep habits ? ?Keep a sleep diary to help you and your health care provider figure out what could be causing your insomnia. Write down: ?When you sleep. ?When you wake up during the night. ?How well you sleep. ?How rested you feel the next day. ?Any side effects of medicines you are taking. ?What you eat and drink. ?Make your bedroom a dark, comfortable place where it is easy to fall asleep. ?Put up shades or blackout curtains to block light from outside. ?Use a white noise machine to block noise. ?Keep the temperature cool. ?Limit screen use before bedtime. This includes: ?Watching TV. ?Using your smartphone, tablet, or computer. ?Stick to a routine that includes going to bed and waking up at the same times  every day and night. This can help you fall asleep faster. Consider making a quiet activity, such  as reading, part of your nighttime routine. ?Try to avoid taking naps during the day so that you sleep better at night. ?Get out of bed if you are still awake after 15 minutes of trying to sleep. Keep the lights down, but try reading or doing a quiet activity. When you feel sleepy, go back to bed. ?General instructions ?Take over-the-counter and prescription medicines only as told by your health care provider. ?Exercise regularly, as told by your health care provider. Avoid exercise starting several hours before bedtime. ?Use relaxation techniques to manage stress. Ask your health care provider to suggest some techniques that may work well for you. These may include: ?Breathing exercises. ?Routines to release muscle tension. ?Visualizing peaceful scenes. ?Make sure that you drive carefully. Avoid driving if you feel very sleepy. ?Keep all follow-up visits as told by your health care provider. This is important. ?Contact a health care provider if: ?You are tired throughout the day. ?You have trouble in your daily routine due to sleepiness. ?You continue to have sleep problems, or your sleep problems get worse. ?Get help right away if: ?You have serious thoughts about hurting yourself or someone else. ?If you ever feel like you may hurt yourself or others, or have thoughts about taking your own life, get help right away. You can go to your nearest emergency department or call: ?Your local emergency services (911 in the [place] ?A suicide crisis helpline, such as the National Suicide Prevention Lifeline at (509)159-7586 or 988 in the [place] This is open 24 hours a day. ?Summary ?Insomnia is a sleep disorder that makes it difficult to fall asleep or stay asleep. ?Insomnia can be long-term (chronic) or short-term (acute). ?Treatment for insomnia depends on the cause. Treatment may focus on treating an underlying condition that is causing insomnia. ?Keep a sleep diary to help you and your health care provider figure out  what could be causing your insomnia. ?This information is not intended to replace advice given to you by your health care provider. Make sure you discuss any questions you have with your health care provider. ?Document Revised: 06/19/2021 Document Reviewed: 10/04/2020 ?Elsevier Patient Education ? 2022 [employer] ? ?

## 2022-02-27 NOTE — Assessment & Plan Note (Signed)
Will recheck K today BMP  ?

## 2022-02-28 LAB — IGG: IgG (Immunoglobin G), Serum: 1634 mg/dL — ABNORMAL HIGH (ref 600–1540)

## 2022-02-28 LAB — ANA: Anti Nuclear Antibody (ANA): NEGATIVE

## 2022-02-28 LAB — ANTI-SMOOTH MUSCLE ANTIBODY, IGG: Actin (Smooth Muscle) Antibody (IGG): 26 U — ABNORMAL HIGH (ref <20)

## 2022-03-03 DIAGNOSIS — E7800 Pure hypercholesterolemia, unspecified: Secondary | ICD-10-CM | POA: Diagnosis not present

## 2022-03-03 DIAGNOSIS — E1122 Type 2 diabetes mellitus with diabetic chronic kidney disease: Secondary | ICD-10-CM | POA: Diagnosis not present

## 2022-03-03 DIAGNOSIS — Z681 Body mass index (BMI) 19 or less, adult: Secondary | ICD-10-CM | POA: Diagnosis not present

## 2022-03-03 DIAGNOSIS — F419 Anxiety disorder, unspecified: Secondary | ICD-10-CM | POA: Diagnosis not present

## 2022-03-03 DIAGNOSIS — I129 Hypertensive chronic kidney disease with stage 1 through stage 4 chronic kidney disease, or unspecified chronic kidney disease: Secondary | ICD-10-CM | POA: Diagnosis not present

## 2022-03-03 DIAGNOSIS — K219 Gastro-esophageal reflux disease without esophagitis: Secondary | ICD-10-CM | POA: Diagnosis not present

## 2022-03-03 DIAGNOSIS — Z87891 Personal history of nicotine dependence: Secondary | ICD-10-CM | POA: Diagnosis not present

## 2022-03-03 DIAGNOSIS — K2970 Gastritis, unspecified, without bleeding: Secondary | ICD-10-CM | POA: Diagnosis not present

## 2022-03-03 DIAGNOSIS — D631 Anemia in chronic kidney disease: Secondary | ICD-10-CM | POA: Diagnosis not present

## 2022-03-03 DIAGNOSIS — Z7982 Long term (current) use of aspirin: Secondary | ICD-10-CM | POA: Diagnosis not present

## 2022-03-03 DIAGNOSIS — E876 Hypokalemia: Secondary | ICD-10-CM | POA: Diagnosis not present

## 2022-03-03 DIAGNOSIS — M810 Age-related osteoporosis without current pathological fracture: Secondary | ICD-10-CM | POA: Diagnosis not present

## 2022-03-03 DIAGNOSIS — G4700 Insomnia, unspecified: Secondary | ICD-10-CM | POA: Diagnosis not present

## 2022-03-03 DIAGNOSIS — G2581 Restless legs syndrome: Secondary | ICD-10-CM | POA: Diagnosis not present

## 2022-03-03 DIAGNOSIS — N189 Chronic kidney disease, unspecified: Secondary | ICD-10-CM | POA: Diagnosis not present

## 2022-03-03 DIAGNOSIS — K5900 Constipation, unspecified: Secondary | ICD-10-CM | POA: Diagnosis not present

## 2022-03-03 DIAGNOSIS — K5730 Diverticulosis of large intestine without perforation or abscess without bleeding: Secondary | ICD-10-CM | POA: Diagnosis not present

## 2022-03-03 DIAGNOSIS — R634 Abnormal weight loss: Secondary | ICD-10-CM | POA: Diagnosis not present

## 2022-03-03 DIAGNOSIS — H9190 Unspecified hearing loss, unspecified ear: Secondary | ICD-10-CM | POA: Diagnosis not present

## 2022-03-05 ENCOUNTER — Ambulatory Visit: Payer: Medicare HMO | Admitting: Nurse Practitioner

## 2022-03-20 ENCOUNTER — Other Ambulatory Visit: Payer: Self-pay | Admitting: Student

## 2022-03-27 ENCOUNTER — Other Ambulatory Visit (INDEPENDENT_AMBULATORY_CARE_PROVIDER_SITE_OTHER): Payer: Medicare HMO

## 2022-03-27 ENCOUNTER — Ambulatory Visit: Payer: Medicare HMO | Admitting: Nurse Practitioner

## 2022-03-27 ENCOUNTER — Encounter: Payer: Self-pay | Admitting: Nurse Practitioner

## 2022-03-27 VITALS — BP 130/70 | HR 90 | Ht 62.0 in | Wt 108.0 lb

## 2022-03-27 DIAGNOSIS — R634 Abnormal weight loss: Secondary | ICD-10-CM | POA: Diagnosis not present

## 2022-03-27 DIAGNOSIS — R748 Abnormal levels of other serum enzymes: Secondary | ICD-10-CM | POA: Diagnosis not present

## 2022-03-27 LAB — BASIC METABOLIC PANEL
BUN: 12 mg/dL (ref 6–23)
CO2: 29 mEq/L (ref 19–32)
Calcium: 9.5 mg/dL (ref 8.4–10.5)
Chloride: 103 mEq/L (ref 96–112)
Creatinine, Ser: 0.79 mg/dL (ref 0.40–1.20)
GFR: 75.66 mL/min (ref >60.00)
Glucose, Bld: 139 mg/dL — ABNORMAL HIGH (ref 70–99)
Potassium: 4.2 mEq/L (ref 3.5–5.1)
Sodium: 139 mEq/L (ref 135–145)

## 2022-03-27 LAB — HEPATIC FUNCTION PANEL
ALT: 21 U/L (ref 0–35)
AST: 26 U/L (ref 0–37)
Albumin: 4.3 g/dL (ref 3.5–5.2)
Alkaline Phosphatase: 117 U/L (ref 39–117)
Bilirubin, Direct: 0.2 mg/dL (ref 0.0–0.3)
Total Bilirubin: 0.9 mg/dL (ref 0.2–1.2)
Total Protein: 7.8 g/dL (ref 6.0–8.3)

## 2022-03-27 NOTE — Progress Notes (Signed)
Agree with assessment and plan as outlined. ?AP very mildly elevated intermittently with  normal AST / ALT and direct bili. Her mild bilirubinemia is indirected. Her CT scan shows no biliary ductal dilation. AMA negative. I don't feel strongly she warrants an MRCP at this time given her recent workup and would monitor her LFTs in a few months. ? ?

## 2022-03-27 NOTE — Progress Notes (Signed)
? ? ? ?03/27/2022 ?Pamela [name] ?[id number] ?1951/04/02 ? ? ?Chief Complaint: Follow up weight loss, stomach issues  ? ?History of Present Illness:  Pamela [name] is a 71 year old female with a past medical history of hypertension, CKD stage III, diverticulitis and GERD.   ? ?She was last seen in office by Dr. Havery Moros 02/20/2022, at that time she complained of a decreased appetite with associated 20 pound weight loss, weight down to 99lbs. She was advised to increase Remeron from 7.5 to 15 mg nightly and she was started on Reglan 5 mg p.o. 3 times daily and Zofran 4 mg ODT every 8 hours as needed.  Abdominal/pelvic CT scan was ordered as an outpatient but was done while she was admitted to the hospital 02/24/2022 due to having right sided back pain.  She was also noted to have hypokalemia with admission potassium level 2.7 which corrected after she received oral and IV KCl.  Her total bili level was elevated at 1.7. Alk phos 106. AST 20. ALT 18. She was discharged home 02/25/2022 with instructions to follow up in our office regarding elevated total bili and alk phos levels. ? ?Currently, she reports feeling quite well.  Her appetite is good.  She no longer feels full easily.  She describes feeling as if her food digests better and no longer feels like food is sitting in her stomach.  She remains on Reglan 5 mg p.o. 3 times daily.  She has gained 9 pounds.  Weight today is 108 pounds.  She is passing normal formed Schewe bowel movement daily.  No rectal bleeding or black stools.  No fevers. ? ?As previously reviewed, she has a history of elevated alk phos levels. ?Labs 05/30/2021: Alk phos 149.  Total bili 0.8.  AST 17.  ALT 20. ?Labs 08/06/2021: ANA positive.  SMA elevated at 25. AMA < 20.  GGT 157. ?Labs 09/10/2021: Alk phos 116.  Total bili 1.2.  AST 22.  ALT 19.  IgG 1,592. ?Labs 12/18/2021: Alk phos 133.  Total bili 0.9.  AST 19.  ALT 13. ?Labs 12/25/2021: Alk phos 106.  Total bili 0.9.  AST 17.  ALT 14. ?Labs  02/20/2022: Alk phos 126.  Total bili 1.1.  AST 17.  ALT 14.  ANA negative.  SMA 26.  IgG 1634.  GGT 79 ?Labs 02/25/2022: Alk phos 92.  Total bili 1.6.  Indirect bili 1.3.  Direct bili 0.3.  AST 32.  ALT 19. ? ?Due to elevated IgG levels, Dr. Havery Moros recommended checking SPEP and serum free light chains.  ? ? ?CT ABDOMEN AND PELVIS WITH CONTRAST 02/24/2022: ?  ?TECHNIQUE: ?Multidetector CT imaging of the abdomen and pelvis was performed ?using the standard protocol following bolus administration of ?intravenous contrast. ?  ?RADIATION DOSE REDUCTION: This exam was performed according to the ?departmental dose-optimization program which includes automated ?exposure control, adjustment of the mA and/or kV according to ?patient size and/or use of iterative reconstruction technique. ?  ?CONTRAST:  190m OMNIPAQUE IOHEXOL 300 MG/ML  SOLN ?  ?COMPARISON:  May 30, 2015 CT and ultrasound August 08, 2020 ?  ?FINDINGS: ?Lower chest: No acute abnormality. ?  ?Hepatobiliary: No suspicious hepatic lesion. Gallbladder is ?unremarkable. No biliary ductal dilation. ?  ?Pancreas: Mild prominence of the pancreatic duct is similar dating ?back to May 30, 2015. No evidence of acute pancreatic inflammation. ?  ?Spleen: No splenomegaly or focal splenic lesion. ?  ?Adrenals/Urinary Tract: Bilateral adrenal glands appear normal. No ?hydronephrosis. Kidneys demonstrate symmetric  enhancement excretion ?of contrast. Urinary bladder is unremarkable for degree of ?distension. ?  ?Stomach/Bowel: No enteric contrast was administered. Wall thickening ?versus underdistention of the gastric antrum. No pathologic dilation ?of small or large bowel. The appendix appears normal. Pancolonic ?diverticulosis with some mild sigmoid colonic wall thickening, ?similar in appearance to CT May 30, 2015 and favored to reflect ?sequela chronic inflammation ( Segmental colitis associated with ?diverticulosis). ?  ?Vascular/Lymphatic: Aortic and branch vessel  atherosclerosis without ?abdominal aortic aneurysm. No pathologically enlarged abdominal or ?pelvic lymph nodes. ?  ?Reproductive: Status post hysterectomy. No adnexal masses. ?  ?Other: No significant abdominopelvic free fluid. ?  ?Musculoskeletal: Mild multilevel degenerative changes spine. No ?acute osseous finding. ?  ?IMPRESSION: ?1. Wall thickening versus underdistention of the gastric antrum. ?Correlate for gastritis. ?2. Pancolonic diverticulosis with some mild sigmoid colonic wall ?thickening, similar in appearance to CT May 30, 2015 and favored to ?reflect sequela chronic inflammation ( Segmental colitis associated ?with diverticulosis). ?3.  Aortic Atherosclerosis (ICD10-I70.0). ? ?Past GI procedures: ?EGD 08/13/21: ?Esophagogastric landmarks identified. ?- 1 cm hiatal hernia. ?- Diverticulum in the lower third of the esophagus. ?- Esophageal plaques were found, suspicious for candidiasis. ?- Perhaps mild trachealization but no dominant stricture - biopsies taken to rule out EoE. ?- Normal stomach. ?- Normal duodenal bulb and second portion of the duodenum.  ?Diagnosis ?Surgical [P], random esophagus ?- REACTIVE SQUAMOUS EPITHELIUM WITH MARKED ACUTE AND CHRONIC INFLAMMATION ?- SEE COMMENT ?Microscopic Comment ?PAS negative for fungal infection ? ?Colonoscopy by Dr. Deatra Ina 01/03/2014 which showed diverticulosis in the ascending, transverse, descending and sigmoid colon.  No polyps.   ?  ?  ?Current Outpatient Medications on File Prior to Visit  ?Medication Sig Dispense Refill  ? amLODipine (NORVASC) 10 MG tablet Take 1 tablet (10 mg total) by mouth at bedtime. 90 tablet 2  ? famotidine (PEPCID) 40 MG tablet Take 1 tablet (40 mg total) by mouth daily. 90 tablet 1  ? metoCLOPramide (REGLAN) 5 MG tablet Take 1 tablet (5 mg total) by mouth 3 (three) times daily before meals. 90 tablet 0  ? mirtazapine (REMERON) 15 MG tablet TAKE 1 TABLET BY MOUTH EVERYDAY AT BEDTIME 90 tablet 1  ? rosuvastatin (CRESTOR) 5 MG  tablet Take 1 tablet (5 mg total) by mouth daily. 90 tablet 3  ? Blood Pressure Monitoring (BLOOD PRESSURE CUFF) MISC 1 Device by Does not apply route daily. 1 each 0  ? [DISCONTINUED] traZODone (DESYREL) 50 MG tablet TAKE 0.5-1 TABLETS (25-50 MG TOTAL) BY MOUTH AT BEDTIME AS NEEDED FOR SLEEP. 90 tablet 0  ? ?No current facility-administered medications on file prior to visit.  ? ?Allergies  ?Allergen Reactions  ? Lisinopril Swelling  ?  Angioedema without airway obstruction  ? Nsaids   ?  Stomach issues  ? ?Current Medications, Allergies, Past Medical History, Past Surgical History, Family History and Social History were reviewed in [employer] record. ? ?Review of Systems:   ?Constitutional: Negative for fever, sweats, chills or weight loss.  ?Respiratory: Negative for shortness of breath.   ?Cardiovascular: Negative for chest pain, palpitations and leg swelling.  ?Gastrointestinal: See HPI.  ?Musculoskeletal: Negative for back pain or muscle aches.  ?Neurological: Negative for dizziness, headaches or paresthesias.  ? ? ?Physical Exam: ?BP 130/70   Pulse 90   Ht _0  (1.575 m)   Wt 108 lb (49 kg)   SpO2 98%   BMI 19.75 kg/m?  ?Wt Readings from Last 3 Encounters:  ?03/27/22 108  lb (49 kg)  ?02/27/22 103 lb 3.2 oz (46.8 kg)  ?02/24/22 98 lb 12.8 oz (44.8 kg)  ?  ? ?General: Pleasant 71 year old female in no acute distress. ?Head: Normocephalic and atraumatic. ?Eyes: No scleral icterus. Conjunctiva pink . ?Ears: Normal auditory acuity. ?Mouth: Dentition intact. No ulcers or lesions.  ?Lungs: Clear throughout to auscultation. ?Heart: Regular rate and rhythm, no murmur. ?Abdomen: Soft, nontender and nondistended. No masses or hepatomegaly. Normal bowel sounds x 4 quadrants.  ?Rectal: Deferred ?Musculoskeletal: Symmetrical with no gross deformities. ?Extremities: No edema. ?Neurological: Alert oriented x 4. No focal deficits.  ?Psychological: Alert and cooperative. Normal mood and  affect ? ?Assessment and Recommendations: ? ?29) 71 year old female with fluctuating mildly elevated to normal alk phos levels with elevated GGT, SMA and IgG levels. T. Bili mildly elevated during her recent hospital admission,

## 2022-03-27 NOTE — Patient Instructions (Signed)
Please proceed to the basement level for lab work before leaving today. Press "B" on the elevator. The lab is located at the first door on the left as you exit the elevator. ? ?Try using Gas-X twice a day as needed. ?Continue Reglan and Remeron. ?Follow up in 3-4 months with Dr. Adela Lank. ? ?Thank you for trusting me with your gastrointestinal care!   ? ?Arnaldo Natal, CRNP ? ? ? ?BMI: ? ?If you are age 71 or older, your body mass index should be between 23-30. Your Body mass index is 19.75 kg/m?Marland Kitchen If this is out of the aforementioned range listed, please consider follow up with your Primary Care Provider. ? ?If you are age 28 or younger, your body mass index should be between 19-25. Your Body mass index is 19.75 kg/m?Marland Kitchen If this is out of the aformentioned range listed, please consider follow up with your Primary Care Provider.  ? ?MY CHART: ? ?The [place] [facility] providers would like to encourage you to use [facility] to communicate with providers for non-urgent requests or questions.  Due to long hold times on the telephone, sending your provider a message by [facility] may be a faster and more efficient way to get a response.  Please allow 48 business hours for a response.  Please remember that this is for non-urgent requests.  ? ?

## 2022-03-31 LAB — PROTEIN ELECTROPHORESIS, SERUM
Albumin ELP: 4.1 g/dL (ref 3.8–4.8)
Alpha 1: 0.3 g/dL (ref 0.2–0.3)
Alpha 2: 0.8 g/dL (ref 0.5–0.9)
Beta 2: 0.5 g/dL (ref 0.2–0.5)
Beta Globulin: 0.5 g/dL (ref 0.4–0.6)
Gamma Globulin: 1.4 g/dL (ref 0.8–1.7)
Total Protein: 7.5 g/dL (ref 6.1–8.1)

## 2022-04-01 LAB — PE AND FLC, SERUM
A/G Ratio: 1.1 (ref 0.7–1.7)
Albumin ELP: 4 g/dL (ref 2.9–4.4)
Alpha 1: 0.2 g/dL (ref 0.0–0.4)
Alpha 2: 0.8 g/dL (ref 0.4–1.0)
Beta: 1 g/dL (ref 0.7–1.3)
Gamma Globulin: 1.5 g/dL (ref 0.4–1.8)
Globulin, Total: 3.5 g/dL (ref 2.2–3.9)
Ig Kappa Free Light Chain: 36.6 mg/L — ABNORMAL HIGH (ref 3.3–19.4)
Ig Lambda Free Light Chain: 18.5 mg/L (ref 5.7–26.3)
KAPPA/LAMBDA RATIO: 1.98 — ABNORMAL HIGH (ref 0.26–1.65)
Total Protein: 7.5 g/dL (ref 6.0–8.5)

## 2022-04-03 ENCOUNTER — Telehealth: Payer: Self-pay | Admitting: Family Medicine

## 2022-04-03 NOTE — Telephone Encounter (Signed)
Patient called stating she had labs done at her gastrologist and they contacted her, telling her she is needing to speak with her doctor about them. Doctors next available is May 12th. She is wanting to know if doctor can call her to go over them with her, so she can know whats going on.  ? ?Please advise. ? ?Thanks! ?

## 2022-04-07 NOTE — Telephone Encounter (Signed)
Patient call nurse line in regards to recent lab results.  ? ?Patient expresses anxiety regarding the results. Patient reports her GI provider could not tell her anything and this scared her.  ? ?I have scheduled patient with PCP for 5/26, however she would like to know what is going on before then.  ? ?Please advise.  ?

## 2022-04-08 NOTE — Telephone Encounter (Signed)
Contacted patient via telephone. Family member states that patient is not available and requests call later in the evening. Informed family member that I would try again depending on clinic schedule availability.  ? ?Ronnald Ramp, MD ?[facility], PGY-3 ?[phone]  ? ?

## 2022-04-09 NOTE — Telephone Encounter (Signed)
Contacted patient via telephone to discuss results.  ?She states that she is working to establish with [facility] home care clinic on 04/08/22. She states that she had blood drawn at that appointment and is looking to follow up regarding her medication. She states that she will have a follow up appointment to discuss results on 04/16/22.  ?Informed patient of abnormal protein levels and potential for MGUS vs malignancy. Patient voiced understanding and states that she does not feel unwell but would like to understand what her blood work means.  ? ?Requested that patient have new provider send office notes/results from their 04/16/22 visit to [facility] medicine center so that she and I can review at her upcoming appt on 05/02/22. She was agreeable with this plan.  ? ?Ronnald Ramp, MD ?[facility], PGY-3 ?[phone]  ? ? ? ?

## 2022-04-10 ENCOUNTER — Other Ambulatory Visit: Payer: Self-pay | Admitting: Gastroenterology

## 2022-04-16 ENCOUNTER — Other Ambulatory Visit: Payer: Self-pay | Admitting: Neurosurgery

## 2022-04-16 ENCOUNTER — Other Ambulatory Visit (HOSPITAL_COMMUNITY): Payer: Self-pay | Admitting: Neurosurgery

## 2022-04-16 DIAGNOSIS — H748X2 Other specified disorders of left middle ear and mastoid: Secondary | ICD-10-CM

## 2022-04-28 NOTE — Progress Notes (Unsigned)
    SUBJECTIVE:   CHIEF COMPLAINT / HPI: weight loss f/u   Patient presents for follow up regarding weight loss. She states ***   Needs bisphosphonate for osteoporosis benefit***   PERTINENT  PMH / PSH: ***  OBJECTIVE:   There were no vitals taken for this visit.  Physical Exam Constitutional:      Appearance: She is normal weight.  HENT:     Right Ear: External ear normal.     Left Ear: External ear normal.     Nose: Nose normal.     Mouth/Throat:     Pharynx: Oropharynx is clear.  Cardiovascular:     Rate and Rhythm: Normal rate and regular rhythm.  Pulmonary:     Effort: Pulmonary effort is normal.  Abdominal:     General: Bowel sounds are normal.     Palpations: Abdomen is soft.  Musculoskeletal:     Cervical back: Normal range of motion.  Skin:    General: Skin is warm.  Neurological:     General: No focal deficit present.     Mental Status: She is alert and oriented to person, place, and time.     ASSESSMENT/PLAN:   No problem-specific Assessment & Plan notes found for this encounter.     Ronnald Ramp, MD [facility]

## 2022-05-02 ENCOUNTER — Ambulatory Visit: Payer: Medicare HMO | Admitting: Family Medicine

## 2022-05-12 ENCOUNTER — Ambulatory Visit (HOSPITAL_COMMUNITY)
Admission: RE | Admit: 2022-05-12 | Discharge: 2022-05-12 | Disposition: A | Discharge status: Home / Self Care | Payer: Medicare HMO | Source: Ambulatory Visit | Attending: Neurosurgery | Admitting: Neurosurgery

## 2022-05-12 DIAGNOSIS — H748X2 Other specified disorders of left middle ear and mastoid: Secondary | ICD-10-CM | POA: Diagnosis present

## 2022-05-12 MED ORDER — GADOBUTROL 1 MMOL/ML IV SOLN
5.0000 mL | Freq: Once | INTRAVENOUS | Status: AC | PRN
Start: 2022-05-12 — End: 2022-05-12
  Administered 2022-05-12: 5 mL via INTRAVENOUS

## 2022-05-26 ENCOUNTER — Other Ambulatory Visit: Payer: Medicare HMO

## 2022-05-29 ENCOUNTER — Other Ambulatory Visit: Payer: Self-pay | Admitting: Family Medicine

## 2022-11-26 ENCOUNTER — Other Ambulatory Visit (HOSPITAL_COMMUNITY): Payer: Self-pay | Admitting: Neurosurgery

## 2022-11-26 DIAGNOSIS — H748X2 Other specified disorders of left middle ear and mastoid: Secondary | ICD-10-CM

## 2022-12-03 ENCOUNTER — Ambulatory Visit (HOSPITAL_COMMUNITY)
Admission: RE | Admit: 2022-12-03 | Discharge: 2022-12-03 | Disposition: A | Discharge status: Home / Self Care | Payer: Medicare HMO | Source: Ambulatory Visit | Attending: Neurosurgery | Admitting: Neurosurgery

## 2022-12-03 DIAGNOSIS — H748X2 Other specified disorders of left middle ear and mastoid: Secondary | ICD-10-CM | POA: Diagnosis present

## 2022-12-03 MED ORDER — GADOBUTROL 1 MMOL/ML IV SOLN
5.0000 mL | Freq: Once | INTRAVENOUS | Status: AC | PRN
Start: 2022-12-03 — End: 2022-12-03
  Administered 2022-12-03: 5 mL via INTRAVENOUS

## 2023-03-05 ENCOUNTER — Other Ambulatory Visit: Payer: Self-pay | Admitting: Family Medicine

## 2023-03-05 DIAGNOSIS — Z0000 Encounter for general adult medical examination without abnormal findings: Secondary | ICD-10-CM

## 2023-03-10 ENCOUNTER — Ambulatory Visit: Payer: Medicare HMO

## 2023-04-21 ENCOUNTER — Ambulatory Visit: Payer: Medicare HMO

## 2023-05-26 ENCOUNTER — Other Ambulatory Visit (HOSPITAL_COMMUNITY): Payer: Self-pay | Admitting: Neurosurgery

## 2023-05-26 DIAGNOSIS — H748X2 Other specified disorders of left middle ear and mastoid: Secondary | ICD-10-CM

## 2023-06-05 ENCOUNTER — Ambulatory Visit (HOSPITAL_COMMUNITY)
Admission: RE | Admit: 2023-06-05 | Discharge: 2023-06-05 | Disposition: A | Discharge status: Home / Self Care | Payer: Medicare HMO | Source: Ambulatory Visit | Attending: Neurosurgery | Admitting: Neurosurgery

## 2023-06-05 DIAGNOSIS — H748X2 Other specified disorders of left middle ear and mastoid: Secondary | ICD-10-CM | POA: Diagnosis present

## 2023-06-05 MED ORDER — GADOBUTROL 1 MMOL/ML IV SOLN
6.0000 mL | Freq: Once | INTRAVENOUS | Status: AC | PRN
Start: 2023-06-05 — End: 2023-06-05
  Administered 2023-06-05: 6 mL via INTRAVENOUS

## 2023-08-04 ENCOUNTER — Ambulatory Visit
Admission: RE | Admit: 2023-08-04 | Discharge: 2023-08-04 | Disposition: A | Discharge status: Home / Self Care | Payer: Medicare HMO | Source: Ambulatory Visit | Attending: Family Medicine | Admitting: Family Medicine

## 2023-08-04 DIAGNOSIS — Z0000 Encounter for general adult medical examination without abnormal findings: Secondary | ICD-10-CM

## 2024-02-08 ENCOUNTER — Other Ambulatory Visit: Payer: Self-pay | Admitting: Family Medicine

## 2024-02-08 DIAGNOSIS — Z1231 Encounter for screening mammogram for malignant neoplasm of breast: Secondary | ICD-10-CM

## 2024-03-03 LAB — EXTERNAL GENERIC LAB PROCEDURE: COLOGUARD: NEGATIVE

## 2024-03-03 LAB — COLOGUARD: COLOGUARD: NEGATIVE

## 2024-06-20 ENCOUNTER — Other Ambulatory Visit: Payer: Self-pay | Admitting: Family Medicine

## 2024-06-20 DIAGNOSIS — N289 Disorder of kidney and ureter, unspecified: Secondary | ICD-10-CM

## 2024-06-24 ENCOUNTER — Emergency Department (HOSPITAL_COMMUNITY)
Admission: EM | Admit: 2024-06-24 | Discharge: 2024-06-24 | Disposition: A | Discharge status: Home / Self Care | Payer: Medicare (Managed Care) | Attending: Emergency Medicine | Admitting: Emergency Medicine

## 2024-06-24 ENCOUNTER — Emergency Department (HOSPITAL_COMMUNITY): Payer: Medicare (Managed Care)

## 2024-06-24 DIAGNOSIS — R1013 Epigastric pain: Secondary | ICD-10-CM | POA: Diagnosis not present

## 2024-06-24 DIAGNOSIS — R079 Chest pain, unspecified: Secondary | ICD-10-CM | POA: Diagnosis not present

## 2024-06-24 DIAGNOSIS — Z79899 Other long term (current) drug therapy: Secondary | ICD-10-CM | POA: Insufficient documentation

## 2024-06-24 DIAGNOSIS — R109 Unspecified abdominal pain: Secondary | ICD-10-CM | POA: Diagnosis present

## 2024-06-24 LAB — CBC WITH DIFFERENTIAL/PLATELET
Abs Immature Granulocytes: 0.01 K/uL (ref 0.00–0.07)
Basophils Absolute: 0 K/uL (ref 0.0–0.1)
Basophils Relative: 1 %
Eosinophils Absolute: 0 K/uL (ref 0.0–0.5)
Eosinophils Relative: 0 %
HCT: 32.6 % — ABNORMAL LOW (ref 36.0–46.0)
Hemoglobin: 10.8 g/dL — ABNORMAL LOW (ref 12.0–15.0)
Immature Granulocytes: 0 %
Lymphocytes Relative: 30 %
Lymphs Abs: 1.5 K/uL (ref 0.7–4.0)
MCH: 33.8 pg (ref 26.0–34.0)
MCHC: 33.1 g/dL (ref 30.0–36.0)
MCV: 101.9 fL — ABNORMAL HIGH (ref 80.0–100.0)
Monocytes Absolute: 0.3 K/uL (ref 0.1–1.0)
Monocytes Relative: 7 %
Neutro Abs: 3.2 K/uL (ref 1.7–7.7)
Neutrophils Relative %: 62 %
Platelets: 233 K/uL (ref 150–400)
RBC: 3.2 MIL/uL — ABNORMAL LOW (ref 3.87–5.11)
RDW: 11.8 % (ref 11.5–15.5)
WBC: 5.1 K/uL (ref 4.0–10.5)
nRBC: 0 % (ref 0.0–0.2)

## 2024-06-24 LAB — COMPREHENSIVE METABOLIC PANEL WITH GFR
ALT: 12 U/L (ref 0–44)
AST: 16 U/L (ref 15–41)
Albumin: 4.3 g/dL (ref 3.5–5.0)
Alkaline Phosphatase: 104 U/L (ref 38–126)
Anion gap: 8 (ref 5–15)
BUN: 13 mg/dL (ref 8–23)
CO2: 28 mmol/L (ref 22–32)
Calcium: 9.5 mg/dL (ref 8.9–10.3)
Chloride: 101 mmol/L (ref 98–111)
Creatinine, Ser: 1.27 mg/dL — ABNORMAL HIGH (ref 0.44–1.00)
GFR, Estimated: 45 mL/min — ABNORMAL LOW (ref >60)
Glucose, Bld: 184 mg/dL — ABNORMAL HIGH (ref 70–99)
Potassium: 3.9 mmol/L (ref 3.5–5.1)
Sodium: 137 mmol/L (ref 135–145)
Total Bilirubin: 0.8 mg/dL (ref 0.0–1.2)
Total Protein: 7.9 g/dL (ref 6.5–8.1)

## 2024-06-24 LAB — TROPONIN I (HIGH SENSITIVITY): Troponin I (High Sensitivity): 4 ng/L (ref <18)

## 2024-06-24 LAB — LIPASE, BLOOD: Lipase: 37 U/L (ref 11–51)

## 2024-06-24 MED ORDER — ALUM & MAG HYDROXIDE-SIMETH 200-200-20 MG/5ML PO SUSP
30.0000 mL | Freq: Once | ORAL | Status: AC
  Administered 2024-06-24: 30 mL via ORAL
  Filled 2024-06-24: qty 30

## 2024-06-24 MED ORDER — PANTOPRAZOLE SODIUM 40 MG IV SOLR
40.0000 mg | Freq: Once | INTRAVENOUS | Status: AC
  Administered 2024-06-24: 40 mg via INTRAVENOUS
  Filled 2024-06-24: qty 10

## 2024-06-24 NOTE — ED Provider Notes (Signed)
 [place] EMERGENCY DEPARTMENT AT [facility] Provider Note   CSN: [id number] Arrival date & time: 06/24/24  9279     Patient presents with: Abdominal Pain and Nausea   Pamela [name] is a 73 y.o. female.   HPI Patient reports that she is having burning and difficulty swallowing.  She reports has been going on about 5 days.  She reports it is making it difficult to eat because it is very uncomfortable.  She describes being started on a liquid medicine and a pill for the symptoms by her doctor 4 days ago.  She reports she is not getting much relief yet.  She denies vomiting.  Patient denies significant recent weight loss.  She reports she is referred to gastroenterology but she cannot be seen until September.  Her daughter reports that the patient had a upper endoscopy about 3 years ago with no specific findings.  Patient denies she also has kidney disease and is referred to nephrology but again at her appointment is still a month or more away.  She does not describe specific symptoms related to this but reports that she does feel that she is getting lightheaded and her heart is racing when she stands or is active.    Prior to Admission medications   Medication Sig Start Date End Date Taking? Authorizing Provider  amLODipine  (NORVASC ) 10 MG tablet Take 1 tablet (10 mg total) by mouth at bedtime. 12/18/21   Mahoney, Caitlin, MD  Blood Pressure Monitoring (BLOOD PRESSURE CUFF) MISC 1 Device by Does not apply route daily. 10/14/18   Erle Hails, DO  famotidine  (PEPCID ) 40 MG tablet Take 1 tablet (40 mg total) by mouth daily. 12/18/21   Mahoney, Caitlin, MD  metoCLOPramide  (REGLAN ) 5 MG tablet Take 1 tablet (5 mg total) by mouth 3 (three) times daily before meals. 04/10/22   Armbruster, Elspeth SQUIBB, MD  mirtazapine  (REMERON ) 15 MG tablet TAKE 1 TABLET BY MOUTH EVERYDAY AT BEDTIME 03/21/22   Simmons-Robinson, Makiera, MD  rosuvastatin  (CRESTOR ) 5 MG tablet Take 1 tablet (5 mg total) by mouth  daily. 12/18/21   Mahoney, Caitlin, MD  traZODone  (DESYREL ) 50 MG tablet TAKE 0.5-1 TABLETS (25-50 MG TOTAL) BY MOUTH AT BEDTIME AS NEEDED FOR SLEEP. 03/27/20 05/21/21  Erle Hails, DO    Allergies: Lisinopril  and Nsaids    Review of Systems  Updated Vital Signs BP (!) 103/42   Pulse 83   Temp 98.2 F (36.8 C) (Oral)   Resp 18   Ht 5' 2 (1.575 m)   Wt 49 kg   SpO2 100%   BMI 19.75 kg/m   Physical Exam Constitutional:      Comments: Alert nontoxic no respiratory distress thin but well-nourished.  HENT:     Mouth/Throat:     Pharynx: Oropharynx is clear.  Eyes:     Extraocular Movements: Extraocular movements intact.  Neck:     Comments: No cervical lymphadenopathy or thyromegaly Cardiovascular:     Rate and Rhythm: Normal rate and regular rhythm.     Heart sounds: Normal heart sounds.  Pulmonary:     Effort: Pulmonary effort is normal.     Breath sounds: Normal breath sounds.  Abdominal:     General: There is no distension.     Palpations: Abdomen is soft.     Comments: Mild epigastric pain to palpation.  No guarding.  Musculoskeletal:        General: No swelling or tenderness. Normal range of motion.  Cervical back: Neck supple.     Right lower leg: No edema.     Left lower leg: No edema.  Skin:    General: Skin is warm and dry.  Neurological:     General: No focal deficit present.     Mental Status: She is oriented to person, place, and time.     Motor: No weakness.     Coordination: Coordination normal.  Psychiatric:        Mood and Affect: Mood normal.     (all labs ordered are listed, but only abnormal results are displayed) Labs Reviewed  COMPREHENSIVE METABOLIC PANEL WITH GFR - Abnormal; Notable for the following components:      Result Value   Glucose, Bld 184 (*)    Creatinine, Ser 1.27 (*)    GFR, Estimated 45 (*)    All other components within normal limits  CBC WITH DIFFERENTIAL/PLATELET - Abnormal; Notable for the following components:    RBC 3.20 (*)    Hemoglobin 10.8 (*)    HCT 32.6 (*)    MCV 101.9 (*)    All other components within normal limits  LIPASE, BLOOD  URINALYSIS, ROUTINE W REFLEX MICROSCOPIC  TROPONIN I (HIGH SENSITIVITY)  TROPONIN I (HIGH SENSITIVITY)    EKG: EKG Interpretation Date/Time:  Friday June 24 2024 08:03:55 EDT Ventricular Rate:  73 PR Interval:  173 QRS Duration:  89 QT Interval:  391 QTC Calculation: 431 R Axis:   65  Text Interpretation: Sinus rhythm Consider right atrial enlargement Borderline T abnormalities, anterior leads no sig change from previous Confirmed by Armenta Canning [phone]) on 06/24/2024 10:02:11 AM  Radiology: [facility] Chest 2 View Result Date: 06/24/2024 CLINICAL DATA:  Chest pain EXAM: CHEST - 2 VIEW COMPARISON:  09/02/2018. FINDINGS: No consolidation, pneumothorax or effusion. No edema. Normal cardiopericardial silhouette. Overlapping cardiac leads. IMPRESSION: No acute cardiopulmonary disease. Electronically Signed   By: Ranell Bring M.D.   On: 06/24/2024 10:43     Procedures   Medications Ordered in the ED  alum & mag hydroxide-simeth (MAALOX/MYLANTA) 200-200-20 MG/5ML suspension 30 mL (30 mLs Oral Given 06/24/24 1051)  pantoprazole  (PROTONIX ) injection 40 mg (40 mg Intravenous Given 06/24/24 1051)                                    Medical Decision Making Amount and/or Complexity of Data Reviewed Labs: ordered. Radiology: ordered.  Risk OTC drugs. Prescription drug management.   Patient presents as outlined.  She has burning quality chest pain that is highly suspicious for reflux and dyspepsia.  Patient was recently started on PPI by her physician but is only taken a couple doses.  Given patient's age and risk factors also consideration given to ACS\pancreatitis\esophageal stricture or spasm.  Will proceed with chest x-ray and basic labs as well as a dose of Protonix  and Maalox.  EKG reviewed by myself shows no change from prior tracings. Troponin 4,  lipase 37, hemoglobin 10.8 which is a stable baseline.  Chest x-ray read by radiology no acute changes  Patient reassessed after treatment and reports all symptoms are resolved.  She has no pain and has been able to drink ginger ale without difficulty.  At this time with workup otherwise reassuring and patient well in appearance, I feel she is stable to continue outpatient management with omeprazole  and Pepcid  which are both recently prescribed by her outpatient provider.  She also has  appointment set up for GI and nephrology.     Final diagnoses:  Chest pain, unspecified type  Dyspepsia    ED Discharge Orders     None          Armenta Canning, MD 06/24/24 1249

## 2024-06-24 NOTE — Discharge Instructions (Signed)
 1.  Your doctor recently prescribed omeprazole  (Prilosec) and famotidine  (Pepcid ).  These medications are to help with pain and burning with swallowing and gastritis.  Take these medications as prescribed every day.  Review the information in your discharge instructions about gastroesophageal reflux disease. 2.  You will need your follow-up with gastroenterology for further evaluation because you might need an upper endoscopy.  Be sure to keep this appointment is very important. 3.  At this time your heart labs and EKG did not show any signs of heart attack.  If your symptoms are worsening or you are getting new additional concerning symptoms, return to the emergency department for recheck. 4.  Do not take any medications such as ibuprofen , naproxen, Aleve, Motrin  because these can worsen irritation of the esophagus and the stomach.

## 2024-06-24 NOTE — ED Triage Notes (Signed)
 Pt arrived reporting stomach pain x1 week. Reports on going G.I issues. Reports N/V since this morning. Denies cp or shob. States has been sick recently, not specific. States she is suppose to have an appointment with G.I specialist and nephrologist. No other symptoms

## 2024-08-04 ENCOUNTER — Ambulatory Visit

## 2024-08-11 ENCOUNTER — Ambulatory Visit

## 2024-08-16 ENCOUNTER — Encounter: Payer: Self-pay | Admitting: Nurse Practitioner

## 2024-08-16 ENCOUNTER — Ambulatory Visit: Payer: Medicare (Managed Care) | Admitting: Nurse Practitioner

## 2024-08-16 VITALS — BP 132/62 | HR 92 | Ht 62.0 in | Wt 112.0 lb

## 2024-08-16 DIAGNOSIS — R1013 Epigastric pain: Secondary | ICD-10-CM

## 2024-08-16 DIAGNOSIS — D649 Anemia, unspecified: Secondary | ICD-10-CM

## 2024-08-16 NOTE — Progress Notes (Unsigned)
     08/16/2024 Pamela [name] [id number] Feb 01, 1951   Chief Complaint:  History of Present Illness: Pamela [name] is a 73 year old female with a past medical history of hypertension, CKD stage III, diverticulitis and GERD.    She endorses having variable stomach pain, feels like she doesn't digest well.   She endorses having epigastric pain once or twice weekly, lately more noticeable at tight.   August got sick, burning sensation in chest, felt hot, went to [facility] ED.  Burning abated next day.   Getting tooth pulled   Eats feels like food is still in esophagus, doesn't digest well goes away a few days,    She underwent a Cologuard test 03/01/2024 which was negative.    Past GI procedures: EGD 08/13/21: Esophagogastric landmarks identified. - 1 cm hiatal hernia. - Diverticulum in the lower third of the esophagus. - Esophageal plaques were found, suspicious for candidiasis. - Perhaps mild trachealization but no dominant stricture - biopsies taken to rule out EoE. - Normal stomach. - Normal duodenal bulb and second portion of the duodenum.  Diagnosis Surgical [P], random esophagus - REACTIVE SQUAMOUS EPITHELIUM WITH MARKED ACUTE AND CHRONIC INFLAMMATION - SEE COMMENT Microscopic Comment PAS negative for fungal infection   Colonoscopy by Dr. Debrah 01/03/2014 which showed diverticulosis in the ascending, transverse, descending and sigmoid colon.  No polyps.         Current Medications, Allergies, Past Medical History, Past Surgical History, Family History and Social History were reviewed in [employer] record.   Review of Systems:   Constitutional: Negative for fever, sweats, chills or weight loss.  Respiratory: Negative for shortness of breath.   Cardiovascular: Negative for chest pain, palpitations and leg swelling.  Gastrointestinal: See HPI.  Musculoskeletal: Negative for back pain or muscle aches.  Neurological: Negative for dizziness, headaches  or paresthesias.    Physical Exam: BP (!) 148/62   Pulse 92   Ht 5' 2 (1.575 m)   Wt 112 lb (50.8 kg)   SpO2 98%   BMI 20.49 kg/m  General: in no acute distress. Head: Normocephalic and atraumatic. Eyes: No scleral icterus. Conjunctiva pink . Ears: Normal auditory acuity. Mouth: Dentition intact. No ulcers or lesions.  Lungs: Clear throughout to auscultation. Heart: Regular rate and rhythm, no murmur. Abdomen: Soft, nontender and nondistended. No masses or hepatomegaly. Normal bowel sounds x 4 quadrants.  Rectal: *** Musculoskeletal: Symmetrical with no gross deformities. Extremities: No edema. Neurological: Alert oriented x 4. No focal deficits.  Psychological: Alert and cooperative. Normal mood and affect  Assessment and Recommendations:   early satiety and weight loss, improving.  Weight 99 pounds up to 108 pounds today.  Appetite has improved. -Continue Remeron  10 mg p.o. nightly and Reglan  5 mg p.o. 3 times daily    Colon cancer screening.  Colonoscopy by Dr. Debrah 12/2013 identified diverticulosis without evidence of colon polyps. -Next colonoscopy due 12/2023

## 2024-08-16 NOTE — Patient Instructions (Addendum)
  Continue Omeprazole  40mg  once daily  Continue Famotidine  40mg  one tab twice daily   Recommend eating 4 small snack sized meals daily. Avoid eating red meat.  I recommend scheduling an upper endoscopy if your heartburn or upper abdominal pain recurs   Follow up with Dr. Leigh in 3 to 4 months   Thank you for trusting me with your gastrointestinal care!   Elida Shawl, CRNP   _______________________________________________________  If your blood pressure at your visit was 140/90 or greater, please contact your primary care physician to follow up on this.  _______________________________________________________  If you are age 60 or older, your body mass index should be between 23-30. Your Body mass index is 20.49 kg/m. If this is out of the aforementioned range listed, please consider follow up with your Primary Care Provider.  If you are age 68 or younger, your body mass index should be between 19-25. Your Body mass index is 20.49 kg/m. If this is out of the aformentioned range listed, please consider follow up with your Primary Care Provider.   ________________________________________________________  The [facility] providers would like to encourage you to use MYCHART to communicate with providers for non-urgent requests or questions.  Due to long hold times on the telephone, sending your provider a message by [facility] may be a faster and more efficient way to get a response.  Please allow 48 business hours for a response.  Please remember that this is for non-urgent requests.  _______________________________________________________  [facility] is using a team-based approach to care.  Your team is made up of your doctor and two to three APPS. Our APPS (Nurse Practitioners and Physician Assistants) work with your physician to ensure care continuity for you. They are fully qualified to address your health concerns and develop a treatment plan. They  communicate directly with your gastroenterologist to care for you. Seeing the Advanced Practice Practitioners on your physician's team can help you by facilitating care more promptly, often allowing for earlier appointments, access to diagnostic testing, procedures, and other specialty referrals.

## 2024-08-17 NOTE — Progress Notes (Signed)
 Agree with assessment and plan as outlined with the following thoughts: If she has responded to conservative measures and feeling better I do not feel strongly that she needs another EGD if she had no high risk lesions on the last exam which was relatively recent.  I would reserve repeat EGD for persistent symptoms despite escalation of medical therapy or iron deficiency anemia on her workup.  Thanks

## 2024-08-19 ENCOUNTER — Ambulatory Visit

## 2024-08-25 ENCOUNTER — Ambulatory Visit
